# Patient Record
Sex: Female | Born: 1960
Health system: Southern US, Community
[De-identification: ages and names within clinical notes are randomized; demographics above are authoritative.]

## PROBLEM LIST (undated history)

## (undated) DIAGNOSIS — I10 Essential (primary) hypertension: Secondary | ICD-10-CM

## (undated) DIAGNOSIS — E119 Type 2 diabetes mellitus without complications: Secondary | ICD-10-CM

## (undated) HISTORY — DX: Essential (primary) hypertension: I10

## (undated) HISTORY — PX: TUBAL LIGATION: SHX77

---

## 1998-03-18 DIAGNOSIS — I10 Essential (primary) hypertension: Secondary | ICD-10-CM | POA: Insufficient documentation

## 2002-03-18 HISTORY — PX: ABDOMINAL HYSTERECTOMY: SHX81

## 2009-02-13 DIAGNOSIS — Z720 Tobacco use: Secondary | ICD-10-CM | POA: Insufficient documentation

## 2009-02-28 ENCOUNTER — Ambulatory Visit: Payer: Self-pay | Admitting: Family Medicine

## 2009-04-22 DIAGNOSIS — G5600 Carpal tunnel syndrome, unspecified upper limb: Secondary | ICD-10-CM | POA: Insufficient documentation

## 2012-07-20 ENCOUNTER — Ambulatory Visit: Payer: Self-pay | Admitting: Family Medicine

## 2012-07-20 LAB — HM MAMMOGRAPHY

## 2013-05-03 LAB — LIPID PANEL
CHOLESTEROL: 184 mg/dL (ref 0–200)
HDL: 54 mg/dL (ref 35–70)
LDL Cholesterol: 107 mg/dL
Triglycerides: 115 mg/dL (ref 40–160)

## 2013-05-03 LAB — TSH: TSH: 0.51 u[IU]/mL (ref <=5.90)

## 2013-05-12 ENCOUNTER — Ambulatory Visit: Payer: Self-pay | Admitting: Family Medicine

## 2013-11-29 LAB — HM PAP SMEAR

## 2013-12-13 LAB — HEMOGLOBIN A1C: Hgb A1c MFr Bld: 6.3 % — AB (ref 4.0–6.0)

## 2013-12-20 ENCOUNTER — Ambulatory Visit: Payer: Self-pay | Admitting: Family Medicine

## 2014-01-28 LAB — BASIC METABOLIC PANEL
BUN: 11 mg/dL (ref 4–21)
Creatinine: 0.9 mg/dL (ref 0.5–1.1)
Glucose: 149 mg/dL
POTASSIUM: 3.9 mmol/L (ref 3.4–5.3)
Sodium: 137 mmol/L (ref 137–147)

## 2014-01-28 LAB — CBC AND DIFFERENTIAL
HEMATOCRIT: 40 % (ref 36–46)
Hemoglobin: 13.7 g/dL (ref 12.0–16.0)
PLATELETS: 398 10*3/uL (ref 150–399)
WBC: 12.4 10^3/mL

## 2014-01-28 LAB — HEPATIC FUNCTION PANEL
ALT: 17 U/L (ref 7–35)
AST: 9 U/L — AB (ref 13–35)

## 2014-05-02 ENCOUNTER — Ambulatory Visit: Admit: 2014-05-02 | Payer: Self-pay | Admitting: Gastroenterology

## 2014-05-02 ENCOUNTER — Ambulatory Visit: Payer: Self-pay | Admitting: Gastroenterology

## 2014-05-02 LAB — HM COLONOSCOPY

## 2014-10-10 ENCOUNTER — Other Ambulatory Visit: Payer: Self-pay | Admitting: Family Medicine

## 2014-10-10 DIAGNOSIS — I10 Essential (primary) hypertension: Secondary | ICD-10-CM

## 2014-10-10 NOTE — Telephone Encounter (Signed)
Last OV 02/04/14

## 2014-12-19 ENCOUNTER — Other Ambulatory Visit: Payer: Self-pay | Admitting: Family Medicine

## 2014-12-19 DIAGNOSIS — I10 Essential (primary) hypertension: Secondary | ICD-10-CM

## 2015-01-11 ENCOUNTER — Ambulatory Visit: Payer: Self-pay | Admitting: Family Medicine

## 2015-01-11 DIAGNOSIS — E559 Vitamin D deficiency, unspecified: Secondary | ICD-10-CM | POA: Insufficient documentation

## 2015-01-11 DIAGNOSIS — E7800 Pure hypercholesterolemia, unspecified: Secondary | ICD-10-CM | POA: Insufficient documentation

## 2015-01-11 DIAGNOSIS — E669 Obesity, unspecified: Secondary | ICD-10-CM | POA: Insufficient documentation

## 2015-01-11 DIAGNOSIS — IMO0002 Reserved for concepts with insufficient information to code with codable children: Secondary | ICD-10-CM | POA: Insufficient documentation

## 2015-01-11 DIAGNOSIS — E119 Type 2 diabetes mellitus without complications: Secondary | ICD-10-CM | POA: Insufficient documentation

## 2015-03-14 ENCOUNTER — Telehealth: Payer: Self-pay | Admitting: Family Medicine

## 2015-03-14 NOTE — Telephone Encounter (Signed)
Patient is calling to see if you will see her son.  He is 21.  He has been losing weight.  States he has lost 100 in the past year.  He was overweight and wanted to lose the weight.  She says he just needs a good check up.  She states she and her husband Mining engineer( Marlon Loeza) are patients of yours.  He is in school in [place] and is home for break.  Her call back number is [phone].  Caroleen Hammanhnaks,  teri

## 2015-03-14 NOTE — Telephone Encounter (Signed)
Ok to put in 30 minute slot on Friday.  Thanks.

## 2015-03-15 NOTE — Telephone Encounter (Signed)
Appointment scheduled for 03/16/2015 per Emily/MW

## 2015-03-21 ENCOUNTER — Other Ambulatory Visit: Payer: Self-pay | Admitting: Family Medicine

## 2015-03-21 DIAGNOSIS — I10 Essential (primary) hypertension: Secondary | ICD-10-CM

## 2015-03-21 NOTE — Telephone Encounter (Signed)
Last OV 01/2014  Thanks,   -Aliayah Tyer  

## 2015-06-13 ENCOUNTER — Other Ambulatory Visit: Payer: Self-pay | Admitting: Family Medicine

## 2015-06-13 DIAGNOSIS — I10 Essential (primary) hypertension: Secondary | ICD-10-CM

## 2015-06-13 NOTE — Telephone Encounter (Signed)
Has appointment scheduled for 08/18/2015. Allene DillonEmily Drozdowski, CMA

## 2015-06-23 ENCOUNTER — Encounter: Payer: Self-pay | Admitting: Family Medicine

## 2015-07-18 ENCOUNTER — Encounter: Payer: Self-pay | Admitting: Podiatry

## 2015-07-18 ENCOUNTER — Ambulatory Visit (INDEPENDENT_AMBULATORY_CARE_PROVIDER_SITE_OTHER): Payer: 59 | Admitting: Podiatry

## 2015-07-18 DIAGNOSIS — L600 Ingrowing nail: Secondary | ICD-10-CM

## 2015-07-18 NOTE — Progress Notes (Signed)
Subjective:    Patient ID: Madison Ritter, female    DOB: 12/13/1960, 55 y.o.   MRN: [id number]  HPI  55 year old female presents the also concerns of ingrown toenail to her right worse than her left big toe. She states that on the inside border of the right big toe she has noticed some previous swelling and a lot of pain to this area. Denies any drainage or pus. She does have an ingrown toenail the left side as well has is not a symptomatic right side. She has been covering with a Band-Aid of the continue be sore. This been ongoing since October of last year. No other complaints.   Review of Systems  All other systems reviewed and are negative.      Objective:   Physical Exam General: AAO x3, NAD  Dermatological: There is incurvation on the medial nail border of the right hallux toenail in the medial nail border left hallux toenail. The right side is much more symptomatic than left side. There is no edema, erythema, drainage or pus to either nail borders. There is tenderness along the lateral nail border bilaterally right side worse than left. No open lesions.  Vascular: Dorsalis Pedis artery and Posterior Tibial artery pedal pulses are 2/4 bilateral with immedate capillary fill time. Pedal hair growth present. No varicosities and no lower extremity edema present bilateral. There is no pain with calf compression, swelling, warmth, erythema.   Neruologic: Grossly intact via light touch bilateral. Vibratory intact via tuning fork bilateral. Protective threshold with Semmes Wienstein monofilament intact to all pedal sites bilateral. Patellar and Achilles deep tendon reflexes 2+ bilateral. No Babinski or clonus noted bilateral.   Musculoskeletal: Mild hallux malleolus is present bilaterally.  No pain, crepitus, or limitation noted with foot and ankle range of motion bilateral. Muscular strength 5/5 in all groups tested bilateral.  Gait: Unassisted, Nonantalgic.      Assessment & Plan:    Symptomatic ingrown toenail right medial nail border greater than left -Treatment options discussed including all alternatives, risks, and complications -Etiology of symptoms were discussed -At this time, the patient is requesting partial nail removal with chemical matricectomy to the symptomatic portion of the nail of the right medial nail border. She wishes to hold off and the left-sided this time.. Risks and complications were discussed with the patient for which they understand and  verbally consent to the procedure. Under sterile conditions a total of 3 mL of a mixture of 2% lidocaine plain and 0.5% Marcaine plain was infiltrated in a hallux block fashion. Once anesthetized, the skin was prepped in sterile fashion. A tourniquet was then applied. Next the right medial aspect of hallux nail border was then sharply excised making sure to remove the entire offending nail border. Once the nails were ensured to be removed area was debrided and the underlying skin was intact. There is no purulence identified in the procedure. Next phenol was then applied under standard conditions and copiously irrigated. Silvadene was applied. A dry sterile dressing was applied. After application of the dressing the tourniquet was removed and there is found to be an immediate capillary refill time to the digit. The patient tolerated the procedure well any complications. Post procedure instructions were discussed the patient for which he verbally understood. Follow-up in one week for nail check or sooner if any problems are to arise. Discussed signs/symptoms of infection and directed to call the office immediately should any occur or go directly to the emergency room. In the  meantime, encouraged to call the office with any questions, concerns, changes symptoms.  Ovid Curd, DPM

## 2015-07-18 NOTE — Patient Instructions (Signed)

## 2015-07-27 ENCOUNTER — Ambulatory Visit: Payer: 59 | Admitting: Podiatry

## 2015-08-01 ENCOUNTER — Encounter: Payer: Self-pay | Admitting: Podiatry

## 2015-08-01 ENCOUNTER — Ambulatory Visit (INDEPENDENT_AMBULATORY_CARE_PROVIDER_SITE_OTHER): Payer: 59 | Admitting: Podiatry

## 2015-08-01 DIAGNOSIS — L600 Ingrowing nail: Secondary | ICD-10-CM

## 2015-08-01 DIAGNOSIS — Z9889 Other specified postprocedural states: Secondary | ICD-10-CM

## 2015-08-01 NOTE — Patient Instructions (Signed)

## 2015-08-01 NOTE — Progress Notes (Signed)
Patient ID: Madison MylarCarmen Ritter, female   DOB: 05/04/1960, 55 y.o.   MRN: [id number]  Subjective: Madison Ritter is a 55 y.o.  female returns to office today for follow up evaluation after having right medial hallux permanenet nail avulsion performed. Patient has been soaking using epsom salts and applying topical antibiotic covered with bandaid daily. No pain or drainage or redness/swelling. Patient denies fevers, chills, nausea, vomiting. Denies any calf pain, chest pain, SOB.   Objective:  Vitals: Reviewed  General: Well developed, nourished, in no acute distress, alert and oriented x3   Dermatology: Skin is warm, dry and supple bilateral. Medial hallux nail border appears to be clean, dry, with mild granular tissue and surrounding scab. There is no surrounding erythema, edema, drainage/purulence. The remaining nails appear unremarkable at this time. There are no other lesions or other signs of infection present.  Neurovascular status: Intact. No lower extremity swelling; No pain with calf compression bilateral.  Musculoskeletal: Decreased tenderness to palpation of the lateral hallux nail fold. Muscular strength within normal limits bilateral.   Assesement and Plan: S/p partial nail avulsion, doing well.   -Continue soaking in epsom salts twice a day followed by antibiotic ointment and a band-aid. Can leave uncovered at night. Continue this until completely healed.  -If the area has not healed in 2 weeks, call the office for follow-up appointment, or sooner if any problems arise.  -Monitor for any signs/symptoms of infection. Call the office immediately if any occur or go directly to the emergency room. Call with any questions/concerns.  Ovid CurdMatthew Lavalle Skoda, DPM

## 2015-08-18 ENCOUNTER — Encounter: Payer: Self-pay | Admitting: Family Medicine

## 2015-08-18 ENCOUNTER — Ambulatory Visit (INDEPENDENT_AMBULATORY_CARE_PROVIDER_SITE_OTHER): Payer: 59 | Admitting: Family Medicine

## 2015-08-18 VITALS — BP 176/106 | HR 88 | Temp 98.2°F | Resp 16 | Ht 64.0 in | Wt 205.0 lb

## 2015-08-18 DIAGNOSIS — R739 Hyperglycemia, unspecified: Secondary | ICD-10-CM

## 2015-08-18 DIAGNOSIS — Z1239 Encounter for other screening for malignant neoplasm of breast: Secondary | ICD-10-CM

## 2015-08-18 DIAGNOSIS — R7309 Other abnormal glucose: Secondary | ICD-10-CM | POA: Diagnosis not present

## 2015-08-18 DIAGNOSIS — E7800 Pure hypercholesterolemia, unspecified: Secondary | ICD-10-CM

## 2015-08-18 DIAGNOSIS — R319 Hematuria, unspecified: Secondary | ICD-10-CM

## 2015-08-18 DIAGNOSIS — Z0000 Encounter for general adult medical examination without abnormal findings: Secondary | ICD-10-CM

## 2015-08-18 DIAGNOSIS — Z1159 Encounter for screening for other viral diseases: Secondary | ICD-10-CM | POA: Diagnosis not present

## 2015-08-18 DIAGNOSIS — E668 Other obesity: Secondary | ICD-10-CM | POA: Diagnosis not present

## 2015-08-18 DIAGNOSIS — Z23 Encounter for immunization: Secondary | ICD-10-CM | POA: Diagnosis not present

## 2015-08-18 DIAGNOSIS — Z720 Tobacco use: Secondary | ICD-10-CM | POA: Diagnosis not present

## 2015-08-18 DIAGNOSIS — IMO0002 Reserved for concepts with insufficient information to code with codable children: Secondary | ICD-10-CM

## 2015-08-18 DIAGNOSIS — E559 Vitamin D deficiency, unspecified: Secondary | ICD-10-CM | POA: Diagnosis not present

## 2015-08-18 DIAGNOSIS — I10 Essential (primary) hypertension: Secondary | ICD-10-CM

## 2015-08-18 LAB — POCT URINALYSIS DIPSTICK
BILIRUBIN UA: NEGATIVE
GLUCOSE UA: 250
KETONES UA: NEGATIVE
Leukocytes, UA: NEGATIVE
Nitrite, UA: NEGATIVE
Protein, UA: NEGATIVE
SPEC GRAV UA: 1.02
UROBILINOGEN UA: 0.2
pH, UA: 5

## 2015-08-18 MED ORDER — LOSARTAN POTASSIUM 50 MG PO TABS: 50.0000 mg | ORAL_TABLET | Freq: Every day | ORAL | Status: DC

## 2015-08-18 NOTE — Progress Notes (Signed)
Patient ID: Madison MylarCarmen Desaulniers, female   DOB: 04/12/1960, 55 y.o.   MRN: [id number]       Patient: Madison Ritter, Female    DOB: 05/02/1960, 55 y.o.   MRN: [id number] Visit Date: 08/18/2015  Today's Provider: Lorie PhenixNancy Elio Haden, MD   Chief Complaint  Patient presents with  . Annual Exam  . Urinary Frequency  . Hypertension   Subjective:    Annual physical exam Madison Ritter is a 55 y.o. female who presents today for health maintenance and complete physical. She feels well. She reports exercising 3 days a week. She reports she is sleeping well. 11/29/13 CPE 11/29/13 Pap-neg 07/20/12 Mammogram-BI-RADS 2 05/02/14 Colonoscopy-2 hyperplastic polyps. Dr. Servando SnareWohl -----------------------------------------------------------------  Frequent urination: patient c/o frequent urination for several months. Patient denies pain or blood with urination.    Hypertension, follow-up:  BP Readings from Last 3 Encounters:  08/18/15 176/106  05/05/14 134/90    She was last seen for hypertension 1 years ago.  BP at that visit was 134/90. Management changes since that visit include no changes. She reports excellent compliance with treatment. She is not having side effects.  She is exercising. She is adherent to low salt diet.   Outside blood pressures are not being checked. She is experiencing fatigue.  Patient denies chest pain.   Cardiovascular risk factors include smoking/ tobacco exposure.  Use of agents associated with hypertension: none.     Weight trend: stable Wt Readings from Last 3 Encounters:  08/18/15 205 lb (92.987 kg)  05/05/14 201 lb (91.173 kg)    Current diet: in general, a "healthy" diet    ------------------------------------------------------------------------     Review of Systems  Constitutional: Positive for fatigue.  HENT: Negative.   Eyes: Positive for discharge and itching.  Respiratory: Negative.   Cardiovascular: Negative.   Gastrointestinal: Negative.     Endocrine: Negative.   Genitourinary: Positive for urgency.  Musculoskeletal: Negative.   Skin: Negative.   Allergic/Immunologic: Negative.   Neurological: Negative.   Hematological: Negative.   Psychiatric/Behavioral: Negative.     Social History      She  reports that she has been smoking Cigars.  She has never used smokeless tobacco. She reports that she drinks about 0.6 oz of alcohol per week. She reports that she does not use illicit drugs.       Social History   Social History  . Marital Status: Married    Spouse Name: N/A  . Number of Children: 1  . Years of Education: N/A   Social History Main Topics  . Smoking status: Current Every Day Smoker    Types: Cigars  . Smokeless tobacco: Never Used     Comment: 1 cigar daily  . Alcohol Use: 0.6 oz/week    1 Glasses of wine per week     Comment: occasional  . Drug Use: No  . Sexual Activity: Not Asked   Other Topics Concern  . None   Social History Narrative    History reviewed. No pertinent past medical history.   Patient Active Problem List   Diagnosis Date Noted  . Hematuria 08/18/2015  . Adult BMI 30+ 01/11/2015  . Elevated blood sugar 01/11/2015  . Hypercholesteremia 01/11/2015  . Avitaminosis D 01/11/2015  . Carpal tunnel syndrome 04/22/2009  . Current tobacco use 02/13/2009  . Essential (primary) hypertension 03/18/1998    Past Surgical History  Procedure Laterality Date  . Abdominal hysterectomy  2004  . Tubal ligation      Family History  Family Status  Relation Status Death Age  . Mother Alive   . Father Deceased 1  . Sister Alive   . Brother Alive   . Brother Alive         Her family history includes Cancer in her father; Congestive Heart Failure in her mother; Diabetes in her mother; Hypertension in her brother, brother, father, and mother; Sleep apnea in her mother; Stroke in her brother and mother; Thyroid disease in her sister.    Allergies  Allergen Reactions  . Sulfa  Antibiotics     Previous Medications   AMLODIPINE (NORVASC) 5 MG TABLET    Take 1 tablet (5 mg total) by mouth daily.   HYDROCHLOROTHIAZIDE (HYDRODIURIL) 12.5 MG TABLET    Take 1 tablet (12.5 mg total) by mouth daily.    Patient Care Team: Lorie Phenix, MD as PCP - General (Family Medicine)     Objective:   Vitals: BP 176/106 mmHg  Pulse 88  Temp(Src) 98.2 F (36.8 C) (Oral)  Resp 16  Ht  (1.626 m)  Wt 205 lb (92.987 kg)  BMI 35.17 kg/m2   Physical Exam  Constitutional: She is oriented to person, place, and time. She appears well-developed and well-nourished.  HENT:  Head: Normocephalic and atraumatic.  Right Ear: Tympanic membrane, external ear and ear canal normal.  Left Ear: Tympanic membrane, external ear and ear canal normal.  Nose: Nose normal.  Mouth/Throat: Uvula is midline, oropharynx is clear and moist and mucous membranes are normal.  Eyes: Conjunctivae, EOM and lids are normal. Pupils are equal, round, and reactive to light.  Neck: Trachea normal and normal range of motion. Neck supple. Carotid bruit is not present. No thyroid mass and no thyromegaly present.  Cardiovascular: Normal rate, regular rhythm and normal heart sounds.   Pulmonary/Chest: Effort normal and breath sounds normal.  Abdominal: Soft. Normal appearance and bowel sounds are normal. There is no hepatosplenomegaly. There is no tenderness.  Genitourinary: No breast swelling, tenderness or discharge.  Musculoskeletal: Normal range of motion.  Lymphadenopathy:    She has no cervical adenopathy.    She has no axillary adenopathy.  Neurological: She is alert and oriented to person, place, and time. She has normal strength. No cranial nerve deficit.  Skin: Skin is warm, dry and intact.  Psychiatric: She has a normal mood and affect. Her speech is normal and behavior is normal. Judgment and thought content normal. Cognition and memory are normal.     Depression Screen PHQ 2/9 Scores 08/18/2015    PHQ - 2 Score 0      Assessment & Plan:     Routine Health Maintenance and Physical Exam  Exercise Activities and Dietary recommendations Goals    None      Immunization History  Administered Date(s) Administered  . Tdap 08/18/2015    1. Annual physical exam Encourage increase activitiy   - POCT urinalysis dipstick Results for orders placed or performed in visit on 08/18/15  POCT urinalysis dipstick  Result Value Ref Range   Color, UA yellow    Clarity, UA clear    Glucose, UA 250    Bilirubin, UA neg    Ketones, UA neg    Spec Grav, UA 1.020    Blood, UA moderate    pH, UA 5.0    Protein, UA neg    Urobilinogen, UA 0.2    Nitrite, UA neg    Leukocytes, UA Negative Negative   2. Breast cancer screening - MM  DIGITAL SCREENING BILATERAL; Future  3. Essential (primary) hypertension Will add Losartan. Recheck in 2 weeks.  - CBC with Differential/Platelet - Comprehensive metabolic panel - losartan (COZAAR) 50 MG tablet; Take 1 tablet (50 mg total) by mouth daily.  Dispense: 90 tablet; Refill: 3  4. Avitaminosis D - VITAMIN D 25 Hydroxy (Vit-D Deficiency, Fractures)  5. Adult BMI 30+ Encouraged lifestyle changes.  6. Current tobacco use Encouraged cessation. Did not address fully. Will address at follow up.   7. Elevated blood sugar Sugar in uine. Concerning for diabetes. Will check sugar.  - Hemoglobin A1c  8. Hypercholesteremia Will check labs.  - Lipid Panel With LDL/HDL Ratio - TSH  9. Need for hepatitis C screening test - Hepatitis C antibody  10. Need for Tdap vaccination Given today.  - Tdap vaccine greater than or equal to 7yo IM  11. Hematuria Will send for UA.  - Urine Microscopic   Patient seen and examined by Dr. Leo Grosser, and note scribed by Liz Beach. Dimas, CMA.  I have reviewed the document for accuracy and completeness and I agree with above. Leo Grosser, MD   Lorie Phenix, MD    --------------------------------------------------------------------

## 2015-08-18 NOTE — Patient Instructions (Signed)
Please call the [facility] at [facility] to schedule this at [phone]   

## 2015-08-23 LAB — LIPID PANEL WITH LDL/HDL RATIO
Cholesterol, Total: 181 mg/dL (ref 100–199)
HDL: 49 mg/dL (ref >39)
LDL Calculated: 110 mg/dL — ABNORMAL HIGH (ref 0–99)
LDL/HDL RATIO: 2.2 ratio (ref 0.0–3.2)
TRIGLYCERIDES: 111 mg/dL (ref 0–149)
VLDL Cholesterol Cal: 22 mg/dL (ref 5–40)

## 2015-08-23 LAB — COMPREHENSIVE METABOLIC PANEL
A/G RATIO: 1.4 (ref 1.2–2.2)
ALBUMIN: 4 g/dL (ref 3.5–5.5)
ALK PHOS: 109 IU/L (ref 39–117)
ALT: 26 IU/L (ref 0–32)
AST: 14 IU/L (ref 0–40)
BILIRUBIN TOTAL: 0.3 mg/dL (ref 0.0–1.2)
BUN / CREAT RATIO: 13 (ref 9–23)
BUN: 10 mg/dL (ref 6–24)
CHLORIDE: 101 mmol/L (ref 96–106)
CO2: 25 mmol/L (ref 18–29)
Calcium: 9.3 mg/dL (ref 8.7–10.2)
Creatinine, Ser: 0.78 mg/dL (ref 0.57–1.00)
GFR calc Af Amer: 100 mL/min/{1.73_m2} (ref >59)
GFR calc non Af Amer: 87 mL/min/{1.73_m2} (ref >59)
GLUCOSE: 124 mg/dL — AB (ref 65–99)
Globulin, Total: 2.9 g/dL (ref 1.5–4.5)
POTASSIUM: 3.8 mmol/L (ref 3.5–5.2)
Sodium: 139 mmol/L (ref 134–144)
Total Protein: 6.9 g/dL (ref 6.0–8.5)

## 2015-08-23 LAB — CBC WITH DIFFERENTIAL/PLATELET
BASOS ABS: 0.1 10*3/uL (ref 0.0–0.2)
Basos: 1 %
EOS (ABSOLUTE): 0.2 10*3/uL (ref 0.0–0.4)
Eos: 3 %
HEMOGLOBIN: 14 g/dL (ref 11.1–15.9)
Hematocrit: 41.1 % (ref 34.0–46.6)
Immature Grans (Abs): 0 10*3/uL (ref 0.0–0.1)
Immature Granulocytes: 0 %
LYMPHS ABS: 2.7 10*3/uL (ref 0.7–3.1)
Lymphs: 33 %
MCH: 30.6 pg (ref 26.6–33.0)
MCHC: 34.1 g/dL (ref 31.5–35.7)
MCV: 90 fL (ref 79–97)
Monocytes Absolute: 0.8 10*3/uL (ref 0.1–0.9)
Monocytes: 9 %
NEUTROS ABS: 4.5 10*3/uL (ref 1.4–7.0)
Neutrophils: 54 %
PLATELETS: 390 10*3/uL — AB (ref 150–379)
RBC: 4.58 x10E6/uL (ref 3.77–5.28)
RDW: 13.6 % (ref 12.3–15.4)
WBC: 8.2 10*3/uL (ref 3.4–10.8)

## 2015-08-23 LAB — VITAMIN D 25 HYDROXY (VIT D DEFICIENCY, FRACTURES): VIT D 25 HYDROXY: 10.5 ng/mL — AB (ref 30.0–100.0)

## 2015-08-23 LAB — HEMOGLOBIN A1C
Est. average glucose Bld gHb Est-mCnc: 154 mg/dL
HEMOGLOBIN A1C: 7 % — AB (ref 4.8–5.6)

## 2015-08-23 LAB — HEPATITIS C ANTIBODY

## 2015-08-23 LAB — TSH: TSH: 0.429 u[IU]/mL — AB (ref 0.450–4.500)

## 2015-08-31 ENCOUNTER — Ambulatory Visit (INDEPENDENT_AMBULATORY_CARE_PROVIDER_SITE_OTHER): Payer: 59 | Admitting: Family Medicine

## 2015-08-31 ENCOUNTER — Encounter: Payer: Self-pay | Admitting: Family Medicine

## 2015-08-31 VITALS — BP 150/100 | HR 80 | Temp 98.3°F | Resp 16 | Wt 199.0 lb

## 2015-08-31 DIAGNOSIS — E7800 Pure hypercholesterolemia, unspecified: Secondary | ICD-10-CM | POA: Diagnosis not present

## 2015-08-31 DIAGNOSIS — Z23 Encounter for immunization: Secondary | ICD-10-CM | POA: Diagnosis not present

## 2015-08-31 DIAGNOSIS — IMO0002 Reserved for concepts with insufficient information to code with codable children: Secondary | ICD-10-CM

## 2015-08-31 DIAGNOSIS — E668 Other obesity: Secondary | ICD-10-CM

## 2015-08-31 DIAGNOSIS — E559 Vitamin D deficiency, unspecified: Secondary | ICD-10-CM

## 2015-08-31 DIAGNOSIS — I10 Essential (primary) hypertension: Secondary | ICD-10-CM | POA: Diagnosis not present

## 2015-08-31 DIAGNOSIS — Z720 Tobacco use: Secondary | ICD-10-CM | POA: Diagnosis not present

## 2015-08-31 DIAGNOSIS — E119 Type 2 diabetes mellitus without complications: Secondary | ICD-10-CM

## 2015-08-31 MED ORDER — LOSARTAN POTASSIUM 100 MG PO TABS: 50.0000 mg | ORAL_TABLET | Freq: Every day | ORAL | Status: DC

## 2015-08-31 MED ORDER — VITAMIN D3 50 MCG (2000 UT) PO TABS: 1.0000 | ORAL_TABLET | Freq: Every day | ORAL | Status: DC

## 2015-08-31 NOTE — Progress Notes (Signed)
Subjective:    Patient ID: Madison Ritter, female    Madison BickersB: 12/05/1960, 55 y.o.   MRN: [id number]  HPI   Follow up for Hypertension  The patient was last seen for this 2 weeks ago. BP was 176/106. Changes made at last visit include adding Losartan.  She reports good compliance with treatment. Has not taken medication today. She is not having side effects.  ------------------------------------------------------------------------------------    Diabetes Mellitus Type II, Follow-up:   Lab Results  Component Value Date   HGBA1C 7.0* 08/22/2015   HGBA1C 6.3* 12/13/2013   This is a new problem. Last A1C was 08/22/2015 and was 7.0%. Current symptoms include visual disturbances and weight loss and have been unchanged.  Episodes of hypoglycemia? no   Current diet: in general, a "healthy" diet   Current exercise: none  Pertinent Labs:    Component Value Date/Time   CHOL 181 08/22/2015 0947   CHOL 184 05/03/2013   TRIG 111 08/22/2015 0947   HDL 49 08/22/2015 0947   HDL 54 05/03/2013   LDLCALC 110* 08/22/2015 0947   LDLCALC 107 05/03/2013   CREATININE 0.78 08/22/2015 0947   CREATININE 0.9 01/28/2014    Wt Readings from Last 3 Encounters:  08/31/15 199 lb (90.266 kg)  08/18/15 205 lb (92.987 kg)  05/05/14 201 lb (91.173 kg)    ------------------------------------------------------------------------   Vitamin D Deficiency Vitamin D was checked on 08/22/2015 and was 10.5. Pt is not taking supplements for this.  Review of Systems  Constitutional: Positive for fatigue. Negative for fever, chills, diaphoresis, activity change and appetite change.  Eyes: Positive for visual disturbance.  Respiratory: Negative for cough, shortness of breath and wheezing.   Cardiovascular: Negative for chest pain, palpitations and leg swelling.  Endocrine: Negative for polydipsia and polyuria.  Neurological: Negative for numbness and headaches.   BP 150/100 mmHg  Pulse 80  Temp(Src)  98.3 F (36.8 C) (Oral)  Resp 16  Wt 199 lb (90.266 kg)   Patient Active Problem List   Diagnosis Date Noted  . Hematuria 08/18/2015  . Adult BMI 30+ 01/11/2015  . Elevated blood sugar 01/11/2015  . Hypercholesteremia 01/11/2015  . Avitaminosis D 01/11/2015  . Carpal tunnel syndrome 04/22/2009  . Current tobacco use 02/13/2009  . Essential (primary) hypertension 03/18/1998   No past medical history on file. Current Outpatient Prescriptions on File Prior to Visit  Medication Sig  . amLODipine (NORVASC) 5 MG tablet Take 1 tablet (5 mg total) by mouth daily.  . hydrochlorothiazide (HYDRODIURIL) 12.5 MG tablet Take 1 tablet (12.5 mg total) by mouth daily.  Marland Kitchen. losartan (COZAAR) 50 MG tablet Take 1 tablet (50 mg total) by mouth daily.   No current facility-administered medications on file prior to visit.   Allergies  Allergen Reactions  . Sulfa Antibiotics    Past Surgical History  Procedure Laterality Date  . Abdominal hysterectomy  2004  . Tubal ligation     Social History   Social History  . Marital Status: Married    Spouse Name: N/A  . Number of Children: 1  . Years of Education: N/A   Occupational History  . Not on file.   Social History Main Topics  . Smoking status: Current Every Day Smoker    Types: Cigars  . Smokeless tobacco: Never Used     Comment: 1 cigar daily  . Alcohol Use: Yes     Comment: occasional  . Drug Use: No  . Sexual Activity: Not on file  Other Topics Concern  . Not on file   Social History Narrative   Family History  Problem Relation Age of Onset  . Diabetes Mother   . Hypertension Mother   . Congestive Heart Failure Mother   . Sleep apnea Mother   . Stroke Mother   . Cancer Father   . Hypertension Father   . Thyroid disease Sister   . Hypertension Brother   . Stroke Brother   . Hypertension Brother        Objective:   Physical Exam  Constitutional: She appears well-developed and well-nourished.  Pulmonary/Chest:  Effort normal.  Psychiatric: She has a normal mood and affect. Her behavior is normal.  BP 150/100 mmHg  Pulse 80  Temp(Src) 98.3 F (36.8 C) (Oral)  Resp 16  Wt 199 lb (90.266 kg)     Assessment & Plan:  1. Essential (primary) hypertension Not to goal. Increase Losartan to 100 mg. Monitor BP at home. Call for problems. FU with Madison Ritter in 3 months. - losartan (COZAAR) 100 MG tablet; Take 0.5 tablets (50 mg total) by mouth daily.  Dispense: 90 tablet; Refill: 3 - Comprehensive metabolic panel  2. Type 2 diabetes mellitus without complication, without long-term current use of insulin (HCC) New onset. A1C was 7.0% 2 weeks ago. Pt refuses medication at this time, would rather work on diet and exercise. Will FU with Madison Ritter in 3 months.  3. Avitaminosis D Start OTC Vitamin D 2000 IU qd. - Cholecalciferol (VITAMIN D3) 2000 units TABS; Take 1 tablet by mouth daily.  Dispense: 30 tablet; Refill: 0  4. Need for pneumococcal vaccination Given today due to new onset diabetes. - Pneumococcal polysaccharide vaccine 23-valent greater than or equal to 2yo subcutaneous/IM  5. Current tobacco use Advised pt to quit smoking, especially now since she is diabetic. Does agree to quit. Will call office if she needs assistance with tobacco cessation.  6. Hypercholesteremia Advised pt she will need to start statin medication if she can not bring sugars down to nondiabetic range.  7. Adult BMI 30+ Pt willing to start lifestyle changes. Does not want to stay diabetic. Is determined to lose weight.   Patient seen and examined by Madison Grosser, MD, and note scribed by Madison Ritter, CMA.   I have reviewed the document for accuracy and completeness and I agree with above. Madison Grosser, MD   Madison Phenix, MD

## 2015-09-01 ENCOUNTER — Telehealth: Payer: Self-pay

## 2015-09-01 LAB — COMPREHENSIVE METABOLIC PANEL
ALBUMIN: 4.2 g/dL (ref 3.5–5.5)
ALT: 23 IU/L (ref 0–32)
AST: 15 IU/L (ref 0–40)
Albumin/Globulin Ratio: 1.4 (ref 1.2–2.2)
Alkaline Phosphatase: 114 IU/L (ref 39–117)
BUN/Creatinine Ratio: 14 (ref 9–23)
BUN: 11 mg/dL (ref 6–24)
Bilirubin Total: 0.4 mg/dL (ref 0.0–1.2)
CALCIUM: 9.9 mg/dL (ref 8.7–10.2)
CO2: 23 mmol/L (ref 18–29)
CREATININE: 0.76 mg/dL (ref 0.57–1.00)
Chloride: 97 mmol/L (ref 96–106)
GFR calc Af Amer: 104 mL/min/{1.73_m2} (ref >59)
GFR calc non Af Amer: 90 mL/min/{1.73_m2} (ref >59)
GLOBULIN, TOTAL: 3 g/dL (ref 1.5–4.5)
Glucose: 136 mg/dL — ABNORMAL HIGH (ref 65–99)
Potassium: 4 mmol/L (ref 3.5–5.2)
Sodium: 138 mmol/L (ref 134–144)
Total Protein: 7.2 g/dL (ref 6.0–8.5)

## 2015-09-01 NOTE — Telephone Encounter (Signed)
-----   Message from Lorie PhenixNancy Maloney, MD sent at 09/01/2015  7:25 AM EDT ----- Labs stable. Please notify patient. Thanks.

## 2015-09-01 NOTE — Telephone Encounter (Signed)
Pt advised.   Thanks,   -Laura  

## 2015-09-13 ENCOUNTER — Other Ambulatory Visit: Payer: Self-pay

## 2015-09-13 DIAGNOSIS — I10 Essential (primary) hypertension: Secondary | ICD-10-CM

## 2015-09-13 MED ORDER — LOSARTAN POTASSIUM 100 MG PO TABS: 100.0000 mg | ORAL_TABLET | Freq: Every day | ORAL | Status: DC

## 2015-09-27 LAB — URINALYSIS, MICROSCOPIC ONLY
BACTERIA UA: NONE SEEN
CASTS: NONE SEEN /LPF
WBC, UA: NONE SEEN /hpf (ref 0–?)

## 2015-12-06 ENCOUNTER — Encounter: Payer: Self-pay | Admitting: Physician Assistant

## 2015-12-06 ENCOUNTER — Ambulatory Visit (INDEPENDENT_AMBULATORY_CARE_PROVIDER_SITE_OTHER): Payer: 59 | Admitting: Physician Assistant

## 2015-12-06 VITALS — BP 150/100 | HR 82 | Temp 98.6°F | Resp 16 | Ht 64.0 in | Wt 205.0 lb

## 2015-12-06 DIAGNOSIS — Z1239 Encounter for other screening for malignant neoplasm of breast: Secondary | ICD-10-CM | POA: Diagnosis not present

## 2015-12-06 DIAGNOSIS — E119 Type 2 diabetes mellitus without complications: Secondary | ICD-10-CM

## 2015-12-06 DIAGNOSIS — I10 Essential (primary) hypertension: Secondary | ICD-10-CM

## 2015-12-06 LAB — POCT GLYCOSYLATED HEMOGLOBIN (HGB A1C)
ESTIMATED AVERAGE GLUCOSE: 146
HEMOGLOBIN A1C: 6.7

## 2015-12-06 MED ORDER — HYDROCHLOROTHIAZIDE 12.5 MG PO TABS: 12.5000 mg | ORAL_TABLET | Freq: Every day | ORAL | 1 refills | Status: DC

## 2015-12-06 MED ORDER — AMLODIPINE BESYLATE 5 MG PO TABS: 5.0000 mg | ORAL_TABLET | Freq: Every day | ORAL | 1 refills | Status: DC

## 2015-12-06 MED ORDER — LOSARTAN POTASSIUM 100 MG PO TABS: 100.0000 mg | ORAL_TABLET | Freq: Every day | ORAL | 1 refills | Status: DC

## 2015-12-06 NOTE — Progress Notes (Signed)
Patient: Madison Ritter Female    DOB: 10/27/1960   55 y.o.   MRN: [id number] Visit Date: 12/11/2015  Today's Provider: Margaretann LovelessJennifer M Colbe Viviano, PA-C   Chief Complaint  Patient presents with  . Diabetes  . Hypertension   Subjective:    HPI  Diabetes Mellitus Type II, Follow-up:   Lab Results  Component Value Date   HGBA1C 6.7 12/06/2015   HGBA1C 7.0 (H) 08/22/2015   HGBA1C 6.3 (A) 12/13/2013    Last seen for diabetes 3 months ago.  Management since then includes work on diet and exercise. She reports good compliance with treatment. She is not having side effects.  Current symptoms include none and have been stable. Home blood sugar records: not being checked  Episodes of hypoglycemia? no   Current Insulin Regimen:  Most Recent Eye Exam: not up to date Weight trend: increasing steadily Prior visit with dietician: no Current diet: in general, a "healthy" diet   Current exercise: walking  Pertinent Labs:    Component Value Date/Time   CHOL 181 08/22/2015 0947   TRIG 111 08/22/2015 0947   HDL 49 08/22/2015 0947   LDLCALC 110 (H) 08/22/2015 0947   CREATININE 0.76 08/31/2015 1040    Wt Readings from Last 3 Encounters:  12/06/15 205 lb (93 kg)  08/31/15 199 lb (90.3 kg)  08/18/15 205 lb (93 kg)    ------------------------------------------------------------------------   Hypertension, follow-up:  BP Readings from Last 3 Encounters:  12/06/15 (!) 150/100  08/31/15 (!) 150/100  08/18/15 (!) 176/106    She was last seen for hypertension 3 months ago.  BP at that visit was 150/100 . Management changes since that visit include increase Losartan to 100 mg. She reports excellent compliance with treatment. She is not having side effects.  She is exercising. She is adherent to low salt diet.   Outside blood pressures are stable. She is experiencing none.  Patient denies chest pain and lower extremity edema.   Cardiovascular risk factors include diabetes  mellitus, hypertension, obesity (BMI >= 30 kg/m2) and smoking/ tobacco exposure.  Use of agents associated with hypertension: none.     Weight trend: increasing steadily Wt Readings from Last 3 Encounters:  12/06/15 205 lb (93 kg)  08/31/15 199 lb (90.3 kg)  08/18/15 205 lb (93 kg)    Current diet: in general, a "healthy" diet    ------------------------------------------------------------------------      Allergies  Allergen Reactions  . Sulfa Antibiotics      Current Outpatient Prescriptions:  .  amLODipine (NORVASC) 5 MG tablet, Take 1 tablet (5 mg total) by mouth daily., Disp: 90 tablet, Rfl: 1 .  Cholecalciferol (VITAMIN D3) 2000 units TABS, Take 1 tablet by mouth daily., Disp: 30 tablet, Rfl: 0 .  hydrochlorothiazide (HYDRODIURIL) 12.5 MG tablet, Take 1 tablet (12.5 mg total) by mouth daily., Disp: 90 tablet, Rfl: 1 .  losartan (COZAAR) 100 MG tablet, Take 1 tablet (100 mg total) by mouth daily., Disp: 90 tablet, Rfl: 1  Review of Systems  Constitutional: Negative.   Respiratory: Negative.   Cardiovascular: Negative.   Gastrointestinal: Negative.   Endocrine: Negative.   Neurological: Negative.     Social History  Substance Use Topics  . Smoking status: Current Every Day Smoker    Types: Cigars  . Smokeless tobacco: Never Used     Comment: 1 cigar daily  . Alcohol use Yes     Comment: occasional   Objective:   BP (!) 150/100 (  BP Location: Left Arm, Patient Position: Sitting, Cuff Size: Large)   Pulse 82   Temp 98.6 F (37 C) (Oral)   Resp 16   Ht 5\' 4"  (1.626 m)   Wt 205 lb (93 kg)   SpO2 98%   BMI 35.19 kg/m   Physical Exam  Constitutional: She appears well-developed and well-nourished. No distress.  Neck: Normal range of motion. Neck supple. No tracheal deviation present. No thyromegaly present.  Cardiovascular: Normal rate, regular rhythm and normal heart sounds.  Exam reveals no gallop and no friction rub.   No murmur heard. Pulmonary/Chest:  Effort normal and breath sounds normal. No respiratory distress. She has no wheezes. She has no rales.  Musculoskeletal: She exhibits no edema.  Lymphadenopathy:    She has no cervical adenopathy.  Skin: She is not diaphoretic.  Psychiatric: She has a normal mood and affect. Her behavior is normal. Judgment and thought content normal.  Vitals reviewed.  Diabetic Foot Form - Detailed   Diabetic Foot Exam - detailed Diabetic Foot exam was performed with the following findings:  Yes 12/06/2015  1:35 PM  Is there a history of foot ulcer?:  No Can the patient see the bottom of their feet?:  Yes Are the shoes appropriate in style and fit?:  Yes Is there swelling or and abnormal foot shape?:  No Are the toenails long?:  No Are the toenails thick?:  No Do you have pain in calf while walking?:  No Is there a claw toe deformity?:  No Is there elevated skin temparature?:  No Is there limited skin dorsiflexion?:  No Is there foot or ankle muscle weakness?:  No Are the toenails ingrown?:  No Normal Range of Motion:  Yes Pulse Foot Exam completed.:  Yes  Right posterior Tibialias:  Present Left posterior Tibialias:  Present  Right Dorsalis Pedis:  Present Left Dorsalis Pedis:  Present  Sensory Foot Exam Completed.:  Yes Swelling:  No Semmes-Weinstein Monofilament Test R Foot Test Control:  Neg L Foot Test Control:  Neg  R Site 1-Great Toe:  Neg L Site 1-Great Toe:  Neg  R Site 4:  Neg L Site 4:  Neg  R Site 5:  Neg L Site 5:  Neg    Comments:  Normal foot exam        Assessment & Plan:     1. Type 2 diabetes mellitus without complication, without long-term current use of insulin (HCC) Hemoglobin A1c improved to 6.7. Discussed with patient to continued to be strict with diet and continue lifestyle modifications. She is to continue to increase physical activity as well. I will see her back in 3 months to recheck her A1c. If her A1c does increase again we did discuss starting metformin. -  POCT glycosylated hemoglobin (Hb A1C)  2. Essential (primary) hypertension Stable. Diagnosis pulled for medication refill. Continue current medical treatment plan. - hydrochlorothiazide (HYDRODIURIL) 12.5 MG tablet; Take 1 tablet (12.5 mg total) by mouth daily.  Dispense: 90 tablet; Refill: 1 - losartan (COZAAR) 100 MG tablet; Take 1 tablet (100 mg total) by mouth daily.  Dispense: 90 tablet; Refill: 1 - amLODipine (NORVASC) 5 MG tablet; Take 1 tablet (5 mg total) by mouth daily.  Dispense: 90 tablet; Refill: 1  3. Breast cancer screening Patient states that she missed her mammogram and would like to have this ordered today. Mammogram was ordered as below. - MM Digital Screening; Future       Margaretann Loveless, PA-C  [facility]

## 2015-12-06 NOTE — Patient Instructions (Signed)
DASH Eating Plan  DASH stands for "Dietary Approaches to Stop Hypertension." The DASH eating plan is a healthy eating plan that has been shown to reduce high blood pressure (hypertension). Additional health benefits may include reducing the risk of type 2 diabetes mellitus, heart disease, and stroke. The DASH eating plan may also help with weight loss.  WHAT DO I NEED TO KNOW ABOUT THE DASH EATING PLAN?  For the DASH eating plan, you will follow these general guidelines:  · Choose foods with a percent daily value for sodium of less than 5% (as listed on the food label).  · Use salt-free seasonings or herbs instead of table salt or sea salt.  · Check with your health care provider or pharmacist before using salt substitutes.  · Eat lower-sodium products, often labeled as "lower sodium" or "no salt added."  · Eat fresh foods.  · Eat more vegetables, fruits, and low-fat dairy products.  · Choose whole grains. Look for the word "whole" as the first word in the ingredient list.  · Choose fish and skinless chicken or turkey more often than red meat. Limit fish, poultry, and meat to 6 oz (170 g) each day.  · Limit sweets, desserts, sugars, and sugary drinks.  · Choose heart-healthy fats.  · Limit cheese to 1 oz (28 g) per day.  · Eat more home-cooked food and less restaurant, buffet, and fast food.  · Limit fried foods.  · Cook foods using methods other than frying.  · Limit canned vegetables. If you do use them, rinse them well to decrease the sodium.  · When eating at a restaurant, ask that your food be prepared with less salt, or no salt if possible.  WHAT FOODS CAN I EAT?  Seek help from a dietitian for individual calorie needs.  Grains  Whole grain or whole wheat bread. Brown rice. Whole grain or whole wheat pasta. Quinoa, bulgur, and whole grain cereals. Low-sodium cereals. Corn or whole wheat flour tortillas. Whole grain cornbread. Whole grain crackers. Low-sodium crackers.  Vegetables  Fresh or frozen vegetables  (raw, steamed, roasted, or grilled). Low-sodium or reduced-sodium tomato and vegetable juices. Low-sodium or reduced-sodium tomato sauce and paste. Low-sodium or reduced-sodium canned vegetables.   Fruits  All fresh, canned (in natural juice), or frozen fruits.  Meat and Other Protein Products  Ground beef (85% or leaner), grass-fed beef, or beef trimmed of fat. Skinless chicken or turkey. Ground chicken or turkey. Pork trimmed of fat. All fish and seafood. Eggs. Dried beans, peas, or lentils. Unsalted nuts and seeds. Unsalted canned beans.  Dairy  Low-fat dairy products, such as skim or 1% milk, 2% or reduced-fat cheeses, low-fat ricotta or cottage cheese, or plain low-fat yogurt. Low-sodium or reduced-sodium cheeses.  Fats and Oils  Tub margarines without trans fats. Light or reduced-fat mayonnaise and salad dressings (reduced sodium). Avocado. Safflower, olive, or canola oils. Natural peanut or almond butter.  Other  Unsalted popcorn and pretzels.  The items listed above may not be a complete list of recommended foods or beverages. Contact your dietitian for more options.  WHAT FOODS ARE NOT RECOMMENDED?  Grains  White bread. White pasta. White rice. Refined cornbread. Bagels and croissants. Crackers that contain trans fat.  Vegetables  Creamed or fried vegetables. Vegetables in a cheese sauce. Regular canned vegetables. Regular canned tomato sauce and paste. Regular tomato and vegetable juices.  Fruits  Dried fruits. Canned fruit in light or heavy syrup. Fruit juice.  Meat and Other Protein   Products  Fatty cuts of meat. Ribs, chicken wings, bacon, sausage, bologna, salami, chitterlings, fatback, hot dogs, bratwurst, and packaged luncheon meats. Salted nuts and seeds. Canned beans with salt.  Dairy  Whole or 2% milk, cream, half-and-half, and cream cheese. Whole-fat or sweetened yogurt. Full-fat cheeses or blue cheese. Nondairy creamers and whipped toppings. Processed cheese, cheese spreads, or cheese  curds.  Condiments  Onion and garlic salt, seasoned salt, table salt, and sea salt. Canned and packaged gravies. Worcestershire sauce. Tartar sauce. Barbecue sauce. Teriyaki sauce. Soy sauce, including reduced sodium. Steak sauce. Fish sauce. Oyster sauce. Cocktail sauce. Horseradish. Ketchup and mustard. Meat flavorings and tenderizers. Bouillon cubes. Hot sauce. Tabasco sauce. Marinades. Taco seasonings. Relishes.  Fats and Oils  Butter, stick margarine, lard, shortening, ghee, and bacon fat. Coconut, palm kernel, or palm oils. Regular salad dressings.  Other  Pickles and olives. Salted popcorn and pretzels.  The items listed above may not be a complete list of foods and beverages to avoid. Contact your dietitian for more information.  WHERE CAN I FIND MORE INFORMATION?  National Heart, Lung, and Blood Institute: www.nhlbi.nih.gov/health/health-topics/topics/dash/     This information is not intended to replace advice given to you by your health care provider. Make sure you discuss any questions you have with your health care provider.     Document Released: 02/21/2011 Document Revised: 03/25/2014 Document Reviewed: 01/06/2013  Elsevier Interactive Patient Education ©2016 [employer]

## 2016-03-07 ENCOUNTER — Ambulatory Visit
Admission: RE | Admit: 2016-03-07 | Discharge: 2016-03-07 | Disposition: A | Discharge status: Home / Self Care | Payer: 59 | Source: Ambulatory Visit | Attending: Physician Assistant | Admitting: Physician Assistant

## 2016-03-07 ENCOUNTER — Ambulatory Visit: Payer: 59 | Admitting: Physician Assistant

## 2016-03-07 DIAGNOSIS — Z1231 Encounter for screening mammogram for malignant neoplasm of breast: Secondary | ICD-10-CM | POA: Insufficient documentation

## 2016-03-07 DIAGNOSIS — Z1239 Encounter for other screening for malignant neoplasm of breast: Secondary | ICD-10-CM

## 2016-07-24 ENCOUNTER — Other Ambulatory Visit: Payer: Self-pay | Admitting: Physician Assistant

## 2016-07-24 DIAGNOSIS — I10 Essential (primary) hypertension: Secondary | ICD-10-CM

## 2016-10-31 ENCOUNTER — Other Ambulatory Visit: Payer: Self-pay | Admitting: Physician Assistant

## 2016-10-31 DIAGNOSIS — I10 Essential (primary) hypertension: Secondary | ICD-10-CM

## 2016-11-25 ENCOUNTER — Ambulatory Visit (INDEPENDENT_AMBULATORY_CARE_PROVIDER_SITE_OTHER): Payer: 59 | Admitting: Physician Assistant

## 2016-11-25 ENCOUNTER — Encounter: Payer: Self-pay | Admitting: Physician Assistant

## 2016-11-25 VITALS — BP 150/100 | HR 91 | Temp 98.4°F | Resp 16 | Ht 64.0 in | Wt 198.0 lb

## 2016-11-25 DIAGNOSIS — E119 Type 2 diabetes mellitus without complications: Secondary | ICD-10-CM | POA: Diagnosis not present

## 2016-11-25 DIAGNOSIS — Z1231 Encounter for screening mammogram for malignant neoplasm of breast: Secondary | ICD-10-CM

## 2016-11-25 DIAGNOSIS — Z1272 Encounter for screening for malignant neoplasm of vagina: Secondary | ICD-10-CM

## 2016-11-25 DIAGNOSIS — Z0000 Encounter for general adult medical examination without abnormal findings: Secondary | ICD-10-CM

## 2016-11-25 DIAGNOSIS — E559 Vitamin D deficiency, unspecified: Secondary | ICD-10-CM

## 2016-11-25 DIAGNOSIS — I10 Essential (primary) hypertension: Secondary | ICD-10-CM

## 2016-11-25 DIAGNOSIS — Z1239 Encounter for other screening for malignant neoplasm of breast: Secondary | ICD-10-CM

## 2016-11-25 DIAGNOSIS — Z23 Encounter for immunization: Secondary | ICD-10-CM

## 2016-11-25 DIAGNOSIS — E0590 Thyrotoxicosis, unspecified without thyrotoxic crisis or storm: Secondary | ICD-10-CM | POA: Diagnosis not present

## 2016-11-25 DIAGNOSIS — Z6833 Body mass index (BMI) 33.0-33.9, adult: Secondary | ICD-10-CM

## 2016-11-25 DIAGNOSIS — E7800 Pure hypercholesterolemia, unspecified: Secondary | ICD-10-CM

## 2016-11-25 NOTE — Patient Instructions (Signed)
Health Maintenance for Postmenopausal Women Menopause is a normal process in which your reproductive ability comes to an end. This process happens gradually over a span of months to years, usually between the ages of 22 and 9. Menopause is complete when you have missed 12 consecutive menstrual periods. It is important to talk with your health care provider about some of the most common conditions that affect postmenopausal women, such as heart disease, cancer, and bone loss (osteoporosis). Adopting a healthy lifestyle and getting preventive care can help to promote your health and wellness. Those actions can also lower your chances of developing some of these common conditions. What should I know about menopause? During menopause, you may experience a number of symptoms, such as:  Moderate-to-severe hot flashes.  Night sweats.  Decrease in sex drive.  Mood swings.  Headaches.  Tiredness.  Irritability.  Memory problems.  Insomnia.  Choosing to treat or not to treat menopausal changes is an individual decision that you make with your health care provider. What should I know about hormone replacement therapy and supplements? Hormone therapy products are effective for treating symptoms that are associated with menopause, such as hot flashes and night sweats. Hormone replacement carries certain risks, especially as you become older. If you are thinking about using estrogen or estrogen with progestin treatments, discuss the benefits and risks with your health care provider. What should I know about heart disease and stroke? Heart disease, heart attack, and stroke become more likely as you age. This may be due, in part, to the hormonal changes that your body experiences during menopause. These can affect how your body processes dietary fats, triglycerides, and cholesterol. Heart attack and stroke are both medical emergencies. There are many things that you can do to help prevent heart disease  and stroke:  Have your blood pressure checked at least every 1-2 years. High blood pressure causes heart disease and increases the risk of stroke.  If you are 53-22 years old, ask your health care provider if you should take aspirin to prevent a heart attack or a stroke.  Do not use any tobacco products, including cigarettes, chewing tobacco, or electronic cigarettes. If you need help quitting, ask your health care provider.  It is important to eat a healthy diet and maintain a healthy weight. ? Be sure to include plenty of vegetables, fruits, low-fat dairy products, and lean protein. ? Avoid eating foods that are high in solid fats, added sugars, or salt (sodium).  Get regular exercise. This is one of the most important things that you can do for your health. ? Try to exercise for at least 150 minutes each week. The type of exercise that you do should increase your heart rate and make you sweat. This is known as moderate-intensity exercise. ? Try to do strengthening exercises at least twice each week. Do these in addition to the moderate-intensity exercise.  Know your numbers.Ask your health care provider to check your cholesterol and your blood glucose. Continue to have your blood tested as directed by your health care provider.  What should I know about cancer screening? There are several types of cancer. Take the following steps to reduce your risk and to catch any cancer development as early as possible. Breast Cancer  Practice breast self-awareness. ? This means understanding how your breasts normally appear and feel. ? It also means doing regular breast self-exams. Let your health care provider know about any changes, no matter how small.  If you are 40  or older, have a clinician do a breast exam (clinical breast exam or CBE) every year. Depending on your age, family history, and medical history, it may be recommended that you also have a yearly breast X-ray (mammogram).  If you  have a family history of breast cancer, talk with your health care provider about genetic screening.  If you are at high risk for breast cancer, talk with your health care provider about having an MRI and a mammogram every year.  Breast cancer (BRCA) gene test is recommended for women who have family members with BRCA-related cancers. Results of the assessment will determine the need for genetic counseling and BRCA1 and for BRCA2 testing. BRCA-related cancers include these types: ? Breast. This occurs in males or females. ? Ovarian. ? Tubal. This may also be called fallopian tube cancer. ? Cancer of the abdominal or pelvic lining (peritoneal cancer). ? Prostate. ? Pancreatic.  Cervical, Uterine, and Ovarian Cancer Your health care provider may recommend that you be screened regularly for cancer of the pelvic organs. These include your ovaries, uterus, and vagina. This screening involves a pelvic exam, which includes checking for microscopic changes to the surface of your cervix (Pap test).  For women ages 21-65, health care providers may recommend a pelvic exam and a Pap test every three years. For women ages 79-65, they may recommend the Pap test and pelvic exam, combined with testing for human papilloma virus (HPV), every five years. Some types of HPV increase your risk of cervical cancer. Testing for HPV may also be done on women of any age who have unclear Pap test results.  Other health care providers may not recommend any screening for nonpregnant women who are considered low risk for pelvic cancer and have no symptoms. Ask your health care provider if a screening pelvic exam is right for you.  If you have had past treatment for cervical cancer or a condition that could lead to cancer, you need Pap tests and screening for cancer for at least 20 years after your treatment. If Pap tests have been discontinued for you, your risk factors (such as having a new sexual partner) need to be  reassessed to determine if you should start having screenings again. Some women have medical problems that increase the chance of getting cervical cancer. In these cases, your health care provider may recommend that you have screening and Pap tests more often.  If you have a family history of uterine cancer or ovarian cancer, talk with your health care provider about genetic screening.  If you have vaginal bleeding after reaching menopause, tell your health care provider.  There are currently no reliable tests available to screen for ovarian cancer.  Lung Cancer Lung cancer screening is recommended for adults 69-62 years old who are at high risk for lung cancer because of a history of smoking. A yearly low-dose CT scan of the lungs is recommended if you:  Currently smoke.  Have a history of at least 30 pack-years of smoking and you currently smoke or have quit within the past 15 years. A pack-year is smoking an average of one pack of cigarettes per day for one year.  Yearly screening should:  Continue until it has been 15 years since you quit.  Stop if you develop a health problem that would prevent you from having lung cancer treatment.  Colorectal Cancer  This type of cancer can be detected and can often be prevented.  Routine colorectal cancer screening usually begins at  age 42 and continues through age 45.  If you have risk factors for colon cancer, your health care provider may recommend that you be screened at an earlier age.  If you have a family history of colorectal cancer, talk with your health care provider about genetic screening.  Your health care provider may also recommend using home test kits to check for hidden blood in your stool.  A small camera at the end of a tube can be used to examine your colon directly (sigmoidoscopy or colonoscopy). This is done to check for the earliest forms of colorectal cancer.  Direct examination of the colon should be repeated every  5-10 years until age 71. However, if early forms of precancerous polyps or small growths are found or if you have a family history or genetic risk for colorectal cancer, you may need to be screened more often.  Skin Cancer  Check your skin from head to toe regularly.  Monitor any moles. Be sure to tell your health care provider: ? About any new moles or changes in moles, especially if there is a change in a mole's shape or color. ? If you have a mole that is larger than the size of a pencil eraser.  If any of your family members has a history of skin cancer, especially at a young age, talk with your health care provider about genetic screening.  Always use sunscreen. Apply sunscreen liberally and repeatedly throughout the day.  Whenever you are outside, protect yourself by wearing long sleeves, pants, a wide-brimmed hat, and sunglasses.  What should I know about osteoporosis? Osteoporosis is a condition in which bone destruction happens more quickly than new bone creation. After menopause, you may be at an increased risk for osteoporosis. To help prevent osteoporosis or the bone fractures that can happen because of osteoporosis, the following is recommended:  If you are 46-71 years old, get at least 1,000 mg of calcium and at least 600 mg of vitamin D per day.  If you are older than age 55 but younger than age 65, get at least 1,200 mg of calcium and at least 600 mg of vitamin D per day.  If you are older than age 54, get at least 1,200 mg of calcium and at least 800 mg of vitamin D per day.  Smoking and excessive alcohol intake increase the risk of osteoporosis. Eat foods that are rich in calcium and vitamin D, and do weight-bearing exercises several times each week as directed by your health care provider. What should I know about how menopause affects my mental health? Depression may occur at any age, but it is more common as you become older. Common symptoms of depression  include:  Low or sad mood.  Changes in sleep patterns.  Changes in appetite or eating patterns.  Feeling an overall lack of motivation or enjoyment of activities that you previously enjoyed.  Frequent crying spells.  Talk with your health care provider if you think that you are experiencing depression. What should I know about immunizations? It is important that you get and maintain your immunizations. These include:  Tetanus, diphtheria, and pertussis (Tdap) booster vaccine.  Influenza every year before the flu season begins.  Pneumonia vaccine.  Shingles vaccine.  Your health care provider may also recommend other immunizations. This information is not intended to replace advice given to you by your health care provider. Make sure you discuss any questions you have with your health care provider. Document Released: 04/26/2005  Document Revised: 09/22/2015 Document Reviewed: 12/06/2014 Elsevier Interactive Patient Education  2018 [employer]  

## 2016-11-25 NOTE — Progress Notes (Signed)
Patient: Madison Ritter, Female    DOB: Jan 12, 1961, 56 y.o.   MRN: [id number] Visit Date: 11/25/2016  Today's Provider: Margaretann Loveless, PA-C   Chief Complaint  Patient presents with  . Annual Exam   Subjective:    Annual physical exam Madison Ritter is a 56 y.o. female who presents today for health maintenance and complete physical. She feels well. She reports exercising. She reports she is sleeping well. Patient declined Influenza vaccine today will wait to get it at work.  Last CPE:08/18/2015 Pap:11/29/13 Negative Mammogram:03/07/16 Normal Colonoscopy:05/02/14 2 Hyperplastic polyps. Dr.Wohl; repeat in 10 years -----------------------------------------------------------------   Review of Systems  Constitutional: Negative.   HENT: Negative.   Eyes: Positive for discharge.  Respiratory: Negative.   Cardiovascular: Negative.   Gastrointestinal: Negative.   Endocrine: Negative.   Genitourinary: Negative.   Musculoskeletal: Negative.   Skin: Negative.   Allergic/Immunologic: Negative.   Neurological: Negative.   Hematological: Negative.   Psychiatric/Behavioral: Negative.     Social History      She  reports that she has been smoking Cigars.  She has never used smokeless tobacco. She reports that she drinks alcohol. She reports that she does not use drugs.       Social History   Social History  . Marital status: Married    Spouse name: N/A  . Number of children: 1  . Years of education: N/A   Social History Main Topics  . Smoking status: Current Every Day Smoker    Types: Cigars  . Smokeless tobacco: Never Used     Comment: 1 cigar daily  . Alcohol use Yes     Comment: occasional  . Drug use: No  . Sexual activity: Not Asked   Other Topics Concern  . None   Social History Narrative  . None    History reviewed. No pertinent past medical history.   Patient Active Problem List   Diagnosis Date Noted  . Hematuria 08/18/2015  . Adult BMI 30+  01/11/2015  . Diabetes mellitus type 2, uncomplicated (HCC) 01/11/2015  . Hypercholesteremia 01/11/2015  . Avitaminosis D 01/11/2015  . Carpal tunnel syndrome 04/22/2009  . Current tobacco use 02/13/2009  . Essential (primary) hypertension 03/18/1998    Past Surgical History:  Procedure Laterality Date  . ABDOMINAL HYSTERECTOMY  2004  . TUBAL LIGATION      Family History        Family Status  Relation Status  . Mother Alive  . Father Deceased at age 100  . Sister Alive  . Brother Alive  . Brother Alive  . Neg Hx (Not Specified)        Her family history includes Cancer in her father; Congestive Heart Failure in her mother; Diabetes in her mother; Hypertension in her brother, brother, father, and mother; Sleep apnea in her mother; Stroke in her brother and mother; Thyroid disease in her sister.     Allergies  Allergen Reactions  . Sulfa Antibiotics      Current Outpatient Prescriptions:  .  amLODipine (NORVASC) 5 MG tablet, TAKE 1 TABLET BY MOUTH  DAILY, Disp: 90 tablet, Rfl: 3 .  hydrochlorothiazide (HYDRODIURIL) 12.5 MG tablet, TAKE 1 TABLET BY MOUTH  DAILY, Disp: 90 tablet, Rfl: 3 .  losartan (COZAAR) 100 MG tablet, TAKE 1 TABLET BY MOUTH  DAILY, Disp: 90 tablet, Rfl: 1 .  Cholecalciferol (VITAMIN D3) 2000 units TABS, Take 1 tablet by mouth daily. (Patient not taking: Reported on 11/25/2016), Disp:  30 tablet, Rfl: 0   Patient Care Team: Margaretann LovelessBurnette, Emani Taussig M, PA-C as PCP - General (Family Medicine)      Objective:   Vitals: BP (!) 150/100 (BP Location: Left Arm, Patient Position: Sitting, Cuff Size: Large)   Pulse 91   Temp 98.4 F (36.9 C) (Oral)   Resp 16   Ht 5\' 4"  (1.626 m)   Wt 198 lb (89.8 kg)   BMI 33.99 kg/m     Physical Exam  Constitutional: She is oriented to person, place, and time. She appears well-developed and well-nourished. No distress.  HENT:  Head: Normocephalic and atraumatic.  Right Ear: Hearing, tympanic membrane, external ear and ear  canal normal.  Left Ear: Hearing, tympanic membrane, external ear and ear canal normal.  Nose: Nose normal.  Mouth/Throat: Uvula is midline, oropharynx is clear and moist and mucous membranes are normal. No oropharyngeal exudate.  Eyes: Pupils are equal, round, and reactive to light. Conjunctivae and EOM are normal. Right eye exhibits no discharge. Left eye exhibits no discharge. No scleral icterus.  Neck: Normal range of motion. Neck supple. No JVD present. Carotid bruit is not present. No tracheal deviation present. No thyromegaly present.  Cardiovascular: Normal rate, regular rhythm, normal heart sounds and intact distal pulses.  Exam reveals no gallop and no friction rub.   No murmur heard. Pulmonary/Chest: Effort normal and breath sounds normal. No respiratory distress. She has no wheezes. She has no rales. She exhibits no tenderness. Right breast exhibits no inverted nipple, no mass, no nipple discharge, no skin change and no tenderness. Left breast exhibits no inverted nipple, no mass, no nipple discharge, no skin change and no tenderness. Breasts are symmetrical.  Abdominal: Soft. Bowel sounds are normal. She exhibits no distension and no mass. There is no tenderness. There is no rebound and no guarding.  Genitourinary: Vagina normal and uterus normal.  Musculoskeletal: Normal range of motion. She exhibits no edema or tenderness.  Lymphadenopathy:    She has no cervical adenopathy.  Neurological: She is alert and oriented to person, place, and time. She has normal reflexes.  Skin: Skin is warm and dry. No rash noted. She is not diaphoretic.  Psychiatric: She has a normal mood and affect. Her behavior is normal. Judgment and thought content normal.  Vitals reviewed.    Depression Screen PHQ 2/9 Scores 11/25/2016 08/18/2015  PHQ - 2 Score 0 0      Assessment & Plan:     Routine Health Maintenance and Physical Exam  Exercise Activities and Dietary recommendations Goals    None       Immunization History  Administered Date(s) Administered  . Pneumococcal Polysaccharide-23 08/31/2015  . Tdap 08/18/2015    Health Maintenance  Topic Date Due  . OPHTHALMOLOGY EXAM  05/18/1972  . HIV Screening  05/18/1977  . HEMOGLOBIN A1C  03/06/2016  . INFLUENZA VACCINE  10/16/2016  . PAP SMEAR  11/29/2016  . FOOT EXAM  12/05/2016  . MAMMOGRAM  03/07/2018  . PNEUMOCOCCAL POLYSACCHARIDE VACCINE (2) 08/30/2020  . COLONOSCOPY  05/02/2024  . TETANUS/TDAP  08/17/2025  . Hepatitis C Screening  Completed     Discussed health benefits of physical activity, and encouraged her to engage in regular exercise appropriate for her age and condition.    1. Annual physical exam Normal physical exam today. Will check labs as below and f/u pending lab results. If labs are stable and WNL she will not need to have these rechecked for one year at her  next annual physical exam. She is to call the office in the meantime if she has any acute issue, questions or concerns. - CBC w/Diff/Platelet - Comprehensive Metabolic Panel (CMET) - TSH - Lipid Profile - HgB A1c  2. Breast cancer screening Breast exam today was normal. There is no family history of breast cancer. She does perform regular self breast exams. Mammogram was ordered as below. Information for [facility] clinic was given to patient so she may schedule her mammogram at her convenience. - MM DIGITAL SCREENING BILATERAL; Future  3. Screening for vaginal cancer Pap collected today. Will send as below and f/u pending results. - Pap IG and HPV (high risk) DNA detection  4. Essential (primary) hypertension Slightly elevated today. Advised patient to check BP at home and call us with readings. Continue current medical treatment plan with losartan , amlodipine  and HCTZ 12.5mg . Will check labs as below and f/u pending results. - CBC w/Diff/Platelet - Comprehensive Metabolic Panel (CMET) - Lipid Profile - HgB A1c  5. Type 2  diabetes mellitus without complication, without long-term current use of insulin (HCC) Stable. Diet controlled. Continue current medical treatment plan. Will check labs as below and f/u pending results. - POCT UA - Microalbumin - CBC w/Diff/Platelet - Comprehensive Metabolic Panel (CMET) - Lipid Profile - HgB A1c  6. Hypercholesteremia Will check labs as below and f/u pending results. Patient has declined statins in the past.  - CBC w/Diff/Platelet - Comprehensive Metabolic Panel (CMET) - Lipid Profile - HgB A1c  7. Avitaminosis D H/O this without treatment at this time. Will check labs as below and f/u pending results. - Vitamin D (25 hydroxy)  8. BMI 33.0-33.9,adult Counseled patient on healthy lifestyle modifications including dieting and exercise.  - Comprehensive Metabolic Panel (CMET) - Lipid Profile - HgB A1c  9. Need for influenza vaccination Patient reports she is going to get through her employer.  --------------------------------------------------------------------    Margaretann Loveless, PA-C  [facility]

## 2016-11-26 LAB — POCT UA - MICROALBUMIN: Microalbumin Ur, POC: 20 mg/L

## 2016-11-27 ENCOUNTER — Telehealth: Payer: Self-pay

## 2016-11-27 LAB — PAP IG AND HPV HIGH-RISK
HPV, high-risk: NEGATIVE
PAP Smear Comment: 0

## 2016-11-27 NOTE — Telephone Encounter (Signed)
-----   Message from Margaretann LovelessJennifer M Burnette, PA-C sent at 11/27/2016  9:31 AM EDT ----- Pap is normal, HPV negative.  Will repeat in 3-5 years.

## 2016-11-27 NOTE — Telephone Encounter (Signed)
Patient advised.KW 

## 2016-12-04 ENCOUNTER — Telehealth: Payer: Self-pay

## 2016-12-04 LAB — COMPREHENSIVE METABOLIC PANEL
ALT: 29 IU/L (ref 0–32)
AST: 17 IU/L (ref 0–40)
Albumin/Globulin Ratio: 1.5 (ref 1.2–2.2)
Albumin: 4.2 g/dL (ref 3.5–5.5)
Alkaline Phosphatase: 137 IU/L — ABNORMAL HIGH (ref 39–117)
BUN/Creatinine Ratio: 16 (ref 9–23)
BUN: 13 mg/dL (ref 6–24)
Bilirubin Total: 0.4 mg/dL (ref 0.0–1.2)
CALCIUM: 9.9 mg/dL (ref 8.7–10.2)
CHLORIDE: 101 mmol/L (ref 96–106)
CO2: 26 mmol/L (ref 20–29)
CREATININE: 0.81 mg/dL (ref 0.57–1.00)
GFR calc Af Amer: 95 mL/min/{1.73_m2} (ref >59)
GFR, EST NON AFRICAN AMERICAN: 83 mL/min/{1.73_m2} (ref >59)
GLOBULIN, TOTAL: 2.8 g/dL (ref 1.5–4.5)
Glucose: 131 mg/dL — ABNORMAL HIGH (ref 65–99)
POTASSIUM: 3.6 mmol/L (ref 3.5–5.2)
SODIUM: 142 mmol/L (ref 134–144)
Total Protein: 7 g/dL (ref 6.0–8.5)

## 2016-12-04 LAB — VITAMIN D 25 HYDROXY (VIT D DEFICIENCY, FRACTURES): Vit D, 25-Hydroxy: 11.9 ng/mL — ABNORMAL LOW (ref 30.0–100.0)

## 2016-12-04 LAB — CBC WITH DIFFERENTIAL/PLATELET
Basophils Absolute: 0.1 10*3/uL (ref 0.0–0.2)
Basos: 1 %
EOS (ABSOLUTE): 0.2 10*3/uL (ref 0.0–0.4)
EOS: 2 %
HEMATOCRIT: 42 % (ref 34.0–46.6)
HEMOGLOBIN: 14.5 g/dL (ref 11.1–15.9)
IMMATURE GRANULOCYTES: 0 %
Immature Grans (Abs): 0 10*3/uL (ref 0.0–0.1)
LYMPHS: 37 %
Lymphocytes Absolute: 3.9 10*3/uL — ABNORMAL HIGH (ref 0.7–3.1)
MCH: 31.3 pg (ref 26.6–33.0)
MCHC: 34.5 g/dL (ref 31.5–35.7)
MCV: 91 fL (ref 79–97)
MONOCYTES: 6 %
Monocytes Absolute: 0.6 10*3/uL (ref 0.1–0.9)
NEUTROS PCT: 54 %
Neutrophils Absolute: 5.8 10*3/uL (ref 1.4–7.0)
Platelets: 326 10*3/uL (ref 150–379)
RBC: 4.63 x10E6/uL (ref 3.77–5.28)
RDW: 13.1 % (ref 12.3–15.4)
WBC: 10.6 10*3/uL (ref 3.4–10.8)

## 2016-12-04 LAB — LIPID PANEL
CHOL/HDL RATIO: 3.8 ratio (ref 0.0–4.4)
Cholesterol, Total: 201 mg/dL — ABNORMAL HIGH (ref 100–199)
HDL: 53 mg/dL (ref >39)
LDL CALC: 115 mg/dL — AB (ref 0–99)
TRIGLYCERIDES: 163 mg/dL — AB (ref 0–149)
VLDL Cholesterol Cal: 33 mg/dL (ref 5–40)

## 2016-12-04 LAB — HEMOGLOBIN A1C
ESTIMATED AVERAGE GLUCOSE: 157 mg/dL
Hgb A1c MFr Bld: 7.1 % — ABNORMAL HIGH (ref 4.8–5.6)

## 2016-12-04 LAB — TSH: TSH: 0.37 u[IU]/mL — ABNORMAL LOW (ref 0.450–4.500)

## 2016-12-04 MED ORDER — VITAMIN D (ERGOCALCIFEROL) 1.25 MG (50000 UNIT) PO CAPS: 50000.0000 [IU] | ORAL_CAPSULE | ORAL | 3 refills | Status: DC

## 2016-12-04 MED ORDER — METFORMIN HCL 500 MG PO TABS: 500.0000 mg | ORAL_TABLET | Freq: Two times a day (BID) | ORAL | 3 refills | Status: DC

## 2016-12-04 NOTE — Addendum Note (Signed)
Addended by: Margaretann Loveless on: 12/04/2016 09:37 AM   Modules accepted: Orders

## 2016-12-04 NOTE — Telephone Encounter (Signed)
Patient advised as below.  

## 2016-12-04 NOTE — Telephone Encounter (Signed)
-----   Message from Margaretann Loveless, PA-C sent at 12/04/2016  9:34 AM EDT ----- Cholesterol slightly up from last year. Continue to work on healthy lifestyle modifications. Vit D also low at 11.9. Recommend starting high dose Vit D 50,000IU weekly. This will be sent in. A1c up from 6.7 to 7.1. Again focus on healthy lifestyle modifications and I would recommend starting metformin  twice daily. I will also send this in as well. Also TSH remains low. Would recommend a thyroid US and possible referral to endocrinology to work up hyperthyroidism.

## 2016-12-06 ENCOUNTER — Ambulatory Visit: Payer: 59

## 2016-12-10 ENCOUNTER — Ambulatory Visit
Admission: RE | Admit: 2016-12-10 | Discharge: 2016-12-10 | Disposition: A | Discharge status: Home / Self Care | Payer: 59 | Source: Ambulatory Visit | Attending: Physician Assistant | Admitting: Physician Assistant

## 2016-12-10 DIAGNOSIS — E0590 Thyrotoxicosis, unspecified without thyrotoxic crisis or storm: Secondary | ICD-10-CM

## 2016-12-10 DIAGNOSIS — E041 Nontoxic single thyroid nodule: Secondary | ICD-10-CM | POA: Insufficient documentation

## 2016-12-11 ENCOUNTER — Telehealth: Payer: Self-pay

## 2016-12-11 NOTE — Telephone Encounter (Signed)
-----   Message from Margaretann Loveless, [place] sent at 12/11/2016  1:14 PM EDT ----- 3 nodules noted on the thyroid gland. 2 are very small, one meets criteria at this time for annual surveillance of the nodule with thyroid US for the next 5 years. We will continue to monitor. Also referral to endocrine for further eval has been approved by insurance so you should be hearing from them about scheduling the appt.

## 2016-12-11 NOTE — Telephone Encounter (Signed)
Patient advised as directed below.  Thanks,  -Joseline 

## 2016-12-31 ENCOUNTER — Encounter: Payer: Self-pay | Admitting: Physician Assistant

## 2017-01-03 ENCOUNTER — Encounter: Payer: Self-pay | Admitting: Physician Assistant

## 2017-01-03 DIAGNOSIS — I10 Essential (primary) hypertension: Secondary | ICD-10-CM

## 2017-01-05 MED ORDER — AMLODIPINE BESYLATE 5 MG PO TABS: 10.0000 mg | ORAL_TABLET | Freq: Every day | ORAL | 3 refills | Status: DC

## 2017-04-14 LAB — HM DIABETES EYE EXAM

## 2017-04-16 ENCOUNTER — Encounter: Payer: Self-pay | Admitting: Physician Assistant

## 2017-08-01 DIAGNOSIS — Z7982 Long term (current) use of aspirin: Secondary | ICD-10-CM | POA: Diagnosis not present

## 2017-08-01 DIAGNOSIS — Z79899 Other long term (current) drug therapy: Secondary | ICD-10-CM | POA: Diagnosis not present

## 2017-08-01 DIAGNOSIS — Z86718 Personal history of other venous thrombosis and embolism: Secondary | ICD-10-CM | POA: Diagnosis not present

## 2017-08-01 DIAGNOSIS — G9340 Encephalopathy, unspecified: Secondary | ICD-10-CM | POA: Diagnosis not present

## 2017-08-01 DIAGNOSIS — E1140 Type 2 diabetes mellitus with diabetic neuropathy, unspecified: Secondary | ICD-10-CM | POA: Diagnosis not present

## 2017-08-01 DIAGNOSIS — D631 Anemia in chronic kidney disease: Secondary | ICD-10-CM | POA: Diagnosis not present

## 2017-08-01 DIAGNOSIS — Z992 Dependence on renal dialysis: Secondary | ICD-10-CM | POA: Diagnosis not present

## 2017-08-01 DIAGNOSIS — E669 Obesity, unspecified: Secondary | ICD-10-CM | POA: Diagnosis not present

## 2017-08-01 DIAGNOSIS — Z8673 Personal history of transient ischemic attack (TIA), and cerebral infarction without residual deficits: Secondary | ICD-10-CM | POA: Diagnosis not present

## 2017-08-01 DIAGNOSIS — Z7902 Long term (current) use of antithrombotics/antiplatelets: Secondary | ICD-10-CM | POA: Diagnosis not present

## 2017-08-01 DIAGNOSIS — E7800 Pure hypercholesterolemia, unspecified: Secondary | ICD-10-CM | POA: Diagnosis not present

## 2017-08-01 DIAGNOSIS — I120 Hypertensive chronic kidney disease with stage 5 chronic kidney disease or end stage renal disease: Secondary | ICD-10-CM | POA: Diagnosis not present

## 2017-08-01 DIAGNOSIS — E1122 Type 2 diabetes mellitus with diabetic chronic kidney disease: Secondary | ICD-10-CM | POA: Diagnosis not present

## 2017-08-01 DIAGNOSIS — N186 End stage renal disease: Secondary | ICD-10-CM | POA: Diagnosis not present

## 2017-08-01 DIAGNOSIS — Z87891 Personal history of nicotine dependence: Secondary | ICD-10-CM | POA: Diagnosis not present

## 2017-08-01 DIAGNOSIS — K5090 Crohn's disease, unspecified, without complications: Secondary | ICD-10-CM | POA: Diagnosis not present

## 2017-11-10 ENCOUNTER — Encounter: Payer: 59 | Admitting: Physician Assistant

## 2017-11-14 ENCOUNTER — Telehealth: Payer: Self-pay | Admitting: Physician Assistant

## 2017-11-14 NOTE — Telephone Encounter (Signed)
Received [employer] F[employer] form, placed form in providers box. Thanks CC

## 2017-12-05 ENCOUNTER — Encounter: Payer: Self-pay | Admitting: Physician Assistant

## 2017-12-05 ENCOUNTER — Ambulatory Visit (INDEPENDENT_AMBULATORY_CARE_PROVIDER_SITE_OTHER): Payer: BLUE CROSS/BLUE SHIELD | Admitting: Physician Assistant

## 2017-12-05 VITALS — BP 190/140 | HR 80 | Temp 98.7°F | Resp 20 | Ht 64.0 in | Wt 197.8 lb

## 2017-12-05 DIAGNOSIS — Z2821 Immunization not carried out because of patient refusal: Secondary | ICD-10-CM | POA: Diagnosis not present

## 2017-12-05 DIAGNOSIS — E119 Type 2 diabetes mellitus without complications: Secondary | ICD-10-CM

## 2017-12-05 DIAGNOSIS — I10 Essential (primary) hypertension: Secondary | ICD-10-CM

## 2017-12-05 DIAGNOSIS — Z6833 Body mass index (BMI) 33.0-33.9, adult: Secondary | ICD-10-CM

## 2017-12-05 DIAGNOSIS — Z0000 Encounter for general adult medical examination without abnormal findings: Secondary | ICD-10-CM

## 2017-12-05 DIAGNOSIS — E0590 Thyrotoxicosis, unspecified without thyrotoxic crisis or storm: Secondary | ICD-10-CM

## 2017-12-05 DIAGNOSIS — Z1231 Encounter for screening mammogram for malignant neoplasm of breast: Secondary | ICD-10-CM | POA: Diagnosis not present

## 2017-12-05 DIAGNOSIS — Z114 Encounter for screening for human immunodeficiency virus [HIV]: Secondary | ICD-10-CM

## 2017-12-05 DIAGNOSIS — Z1239 Encounter for other screening for malignant neoplasm of breast: Secondary | ICD-10-CM

## 2017-12-05 DIAGNOSIS — M722 Plantar fascial fibromatosis: Secondary | ICD-10-CM

## 2017-12-05 DIAGNOSIS — E7800 Pure hypercholesterolemia, unspecified: Secondary | ICD-10-CM

## 2017-12-05 LAB — POCT UA - MICROALBUMIN: Microalbumin Ur, POC: NEGATIVE mg/L

## 2017-12-05 MED ORDER — AMLODIPINE-VALSARTAN-HCTZ 10-160-25 MG PO TABS: 1.0000 | ORAL_TABLET | Freq: Every day | ORAL | 3 refills | Status: DC

## 2017-12-05 NOTE — Progress Notes (Signed)
Patient: Madison Ritter, Female    DOB: 09/16/1960, 57 y.o.   MRN: [id number] Visit Date: 12/05/2017  Today's Provider: Margaretann LovelessJennifer M Karlin Heilman, PA-C   Chief Complaint  Patient presents with  . Annual Exam   Subjective:    Annual physical exam Madison Ritter is a 57 y.o. female who presents today for health maintenance and complete physical. She feels well. She reports exercising. She reports she is sleeping well.  Last CPE:11/25/2016 Pap:11/25/16-Normal.HPV-Negative-Repeat 3-5 yrs. Colonoscopy:05/02/14-Repeat in 10 yrs. ----------------------------------------------------------------- Patient C/O persistent left heel pain x's 2-3 weeks. Worse with waking and standing for the first time.  Patient C/O on and off right ankle pain.  Patient C/O possible cyst on left breast x's 2-3 weeks. Patient reports some pain and denies any discharge. Improving now.  Review of Systems  Constitutional: Negative.   HENT: Negative.   Eyes: Positive for discharge and itching.  Respiratory: Negative.   Cardiovascular: Negative.   Gastrointestinal: Negative.   Endocrine: Negative.   Genitourinary: Negative.   Musculoskeletal: Positive for arthralgias and myalgias.  Skin: Negative.   Allergic/Immunologic: Negative.   Neurological: Negative.   Hematological: Negative.   Psychiatric/Behavioral: Negative.     Social History      She  reports that she has been smoking cigars. She has a 10.00 pack-year smoking history. She has never used smokeless tobacco. She reports that she drinks alcohol. She reports that she does not use drugs.       Social History   Socioeconomic History  . Marital status: Married    Spouse name: Not on file  . Number of children: 1  . Years of education: Not on file  . Highest education level: Not on file  Occupational History  . Not on file  Social Needs  . Financial resource strain: Not on file  . Food insecurity:    Worry: Not on file    Inability: Not on  file  . Transportation needs:    Medical: Not on file    Non-medical: Not on file  Tobacco Use  . Smoking status: Current Every Day Smoker    Packs/day: 1.00    Years: 10.00    Pack years: 10.00    Types: Cigars  . Smokeless tobacco: Never Used  . Tobacco comment: 1 cigar daily  Substance and Sexual Activity  . Alcohol use: Yes    Comment: occasional  . Drug use: No  . Sexual activity: Not on file  Lifestyle  . Physical activity:    Days per week: Not on file    Minutes per session: Not on file  . Stress: Not on file  Relationships  . Social connections:    Talks on phone: Not on file    Gets together: Not on file    Attends religious service: Not on file    Active member of club or organization: Not on file    Attends meetings of clubs or organizations: Not on file    Relationship status: Not on file  Other Topics Concern  . Not on file  Social History Narrative  . Not on file    History reviewed. No pertinent past medical history.   Patient Active Problem List   Diagnosis Date Noted  . Hematuria 08/18/2015  . Adult BMI 30+ 01/11/2015  . Diabetes mellitus type 2, uncomplicated (HCC) 01/11/2015  . Hypercholesteremia 01/11/2015  . Vitamin D deficiency 01/11/2015  . Carpal tunnel syndrome 04/22/2009  . Current tobacco use 02/13/2009  .  Essential (primary) hypertension 03/18/1998    Past Surgical History:  Procedure Laterality Date  . ABDOMINAL HYSTERECTOMY  2004  . TUBAL LIGATION      Family History        Family Status  Relation Name Status  . Mother  Alive  . Father  Deceased at age 57  . Sister  Alive  . Brother  Alive  . Brother  Alive  . Neg Hx  (Not Specified)        Her family history includes Cancer in her father; Congestive Heart Failure in her mother; Diabetes in her mother; Hypertension in her brother, brother, father, and mother; Sleep apnea in her mother; Stroke in her brother and mother; Thyroid disease in her sister. There is no history  of Breast cancer.      Allergies  Allergen Reactions  . Sulfa Antibiotics      Current Outpatient Medications:  .  amLODipine (NORVASC) 5 MG tablet, Take 2 tablets (10 mg total) by mouth daily., Disp: 90 tablet, Rfl: 3 .  hydrochlorothiazide (HYDRODIURIL) 12.5 MG tablet, TAKE 1 TABLET BY MOUTH  DAILY, Disp: 90 tablet, Rfl: 3 .  losartan (COZAAR) 100 MG tablet, TAKE 1 TABLET BY MOUTH  DAILY, Disp: 90 tablet, Rfl: 1 .  metFORMIN (GLUCOPHAGE) 500 MG tablet, Take 1 tablet (500 mg total) by mouth 2 (two) times daily with a meal., Disp: 180 tablet, Rfl: 3   Patient Care Team: Margaretann Loveless, PA-C as PCP - General (Family Medicine)      Objective:   Vitals: BP (!) 190/140 (BP Location: Left Arm, Patient Position: Sitting, Cuff Size: Large)   Pulse 80   Temp 98.7 F (37.1 C) (Oral)   Resp 20   Ht 5\' 4"  (1.626 m)   Wt 197 lb 12.8 oz (89.7 kg)   BMI 33.95 kg/m    Vitals:   12/05/17 1029  BP: (!) 190/140  Pulse: 80  Resp: 20  Temp: 98.7 F (37.1 C)  TempSrc: Oral  Weight: 197 lb 12.8 oz (89.7 kg)  Height: 5\' 4"  (1.626 m)     Physical Exam  Constitutional: She is oriented to person, place, and time. She appears well-developed and well-nourished. No distress.  HENT:  Head: Normocephalic and atraumatic.  Right Ear: Hearing, tympanic membrane, external ear and ear canal normal.  Left Ear: Hearing, tympanic membrane, external ear and ear canal normal.  Nose: Nose normal.  Mouth/Throat: Uvula is midline, oropharynx is clear and moist and mucous membranes are normal. No oropharyngeal exudate.  Eyes: Pupils are equal, round, and reactive to light. Conjunctivae and EOM are normal. Right eye exhibits no discharge. Left eye exhibits no discharge. No scleral icterus.  Neck: Normal range of motion. Neck supple. No JVD present. Carotid bruit is not present. No tracheal deviation present. No thyromegaly present.  Cardiovascular: Normal rate, regular rhythm, normal heart sounds and  intact distal pulses. Exam reveals no gallop and no friction rub.  No murmur heard. Pulmonary/Chest: Effort normal and breath sounds normal. No respiratory distress. She has no wheezes. She has no rales. She exhibits no tenderness. Right breast exhibits no inverted nipple, no mass, no nipple discharge, no skin change and no tenderness. Left breast exhibits no inverted nipple, no mass, no nipple discharge, no skin change and no tenderness. No breast tenderness, discharge or bleeding. Breasts are symmetrical.  Abdominal: Soft. Bowel sounds are normal. She exhibits no distension and no mass. There is no tenderness. There is no rebound and  no guarding. Hernia confirmed negative in the right inguinal area and confirmed negative in the left inguinal area.  Genitourinary: Rectum normal, vagina normal and uterus normal. No breast tenderness, discharge or bleeding. Pelvic exam was performed with patient supine. There is no rash, tenderness, lesion or injury on the right labia. There is no rash, tenderness, lesion or injury on the left labia. Cervix exhibits no motion tenderness, no discharge and no friability. Right adnexum displays no mass, no tenderness and no fullness. Left adnexum displays no mass, no tenderness and no fullness. No erythema, tenderness or bleeding in the vagina. No signs of injury around the vagina. No vaginal discharge found.  Musculoskeletal: Normal range of motion. She exhibits no edema or tenderness.  Lymphadenopathy:    She has no cervical adenopathy.       Right: No inguinal adenopathy present.       Left: No inguinal adenopathy present.  Neurological: She is alert and oriented to person, place, and time. She has normal reflexes. No cranial nerve deficit. Coordination normal.  Skin: Skin is warm and dry. No rash noted. She is not diaphoretic.  Psychiatric: She has a normal mood and affect. Her behavior is normal. Judgment and thought content normal.  Vitals reviewed.    Depression  Screen PHQ 2/9 Scores 12/05/2017 11/25/2016 08/18/2015  PHQ - 2 Score 0 0 0  PHQ- 9 Score 4 - -      Assessment & Plan:     Routine Health Maintenance and Physical Exam  Exercise Activities and Dietary recommendations Goals   None     Immunization History  Administered Date(s) Administered  . Influenza,inj,Quad PF,6+ Mos 11/29/2013, 01/09/2017  . Pneumococcal Polysaccharide-23 08/31/2015  . Tdap 08/18/2015    Health Maintenance  Topic Date Due  . HIV Screening  05/18/1977  . FOOT EXAM  12/05/2016  . HEMOGLOBIN A1C  03/04/2017  . INFLUENZA VACCINE  10/16/2017  . MAMMOGRAM  03/07/2018  . OPHTHALMOLOGY EXAM  04/14/2018  . PAP SMEAR  11/26/2019  . COLONOSCOPY  05/02/2024  . TETANUS/TDAP  08/17/2025  . PNEUMOCOCCAL POLYSACCHARIDE VACCINE AGE 40-64 HIGH RISK  Completed  . Hepatitis C Screening  Completed     Discussed health benefits of physical activity, and encouraged her to engage in regular exercise appropriate for her age and condition.    1. Annual physical exam Normal physical exam today. Will check labs as below and f/u pending lab results. If labs are stable and WNL she will not need to have these rechecked for one year at her next annual physical exam. She is to call the office in the meantime if she has any acute issue, questions or concerns.  2. Breast cancer screening Breast exam today was normal. There is no family history of breast cancer. She does perform regular self breast exams. Mammogram was ordered as below. Information for [facility] clinic was given to patient so she may schedule her mammogram at her convenience. - MM 3D SCREEN BREAST BILATERAL; Future  3. Type 2 diabetes mellitus without complication, without long-term current use of insulin (HCC) Microalbumin normal. Continue Metformin 500mg  BID. Will check labs as below and f/u pending results. - Hemoglobin A1c - POCT UA - Microalbumin  4. Hypercholesteremia Diet controlled. Patient  declines starting a statin. Will check labs as below and f/u pending results. - Lipid panel  5. Hyperthyroidism Will check labs as below and f/u pending results. - TSH  6. Declined influenza vaccination Will get through employer.  7.  Essential (primary) hypertension Uncontrolled. Will increase amlodipine to 10mg . Will also change losartan to valsartan as below to be able to give a combination pill. She is to send a mychart message with BP readings in the next 4-6 weeks.  - CBC with Differential/Platelet - Comprehensive metabolic panel - amLODIPine-Valsartan-HCTZ 10-160-25 MG TABS; Take 1 tablet by mouth daily.  Dispense: 90 tablet; Refill: 3  8. Screening for HIV (human immunodeficiency virus) Will check labs as below and f/u pending results. - HIV Antibody (routine testing w rflx)  9. BMI 33.0-33.9,adult Counseled patient on healthy lifestyle modifications including dieting and exercise.   10. Plantar fasciitis of left foot Worsening. Discussed conservative treatment with arch support, frozen water bottle massage, stretching. Call if no improvements.   --------------------------------------------------------------------    Margaretann Loveless, PA-C  [facility]

## 2017-12-05 NOTE — Patient Instructions (Signed)
Health Maintenance for Postmenopausal Women Menopause is a normal process in which your reproductive ability comes to an end. This process happens gradually over a span of months to years, usually between the ages of 22 and 9. Menopause is complete when you have missed 12 consecutive menstrual periods. It is important to talk with your health care provider about some of the most common conditions that affect postmenopausal women, such as heart disease, cancer, and bone loss (osteoporosis). Adopting a healthy lifestyle and getting preventive care can help to promote your health and wellness. Those actions can also lower your chances of developing some of these common conditions. What should I know about menopause? During menopause, you may experience a number of symptoms, such as:  Moderate-to-severe hot flashes.  Night sweats.  Decrease in sex drive.  Mood swings.  Headaches.  Tiredness.  Irritability.  Memory problems.  Insomnia.  Choosing to treat or not to treat menopausal changes is an individual decision that you make with your health care provider. What should I know about hormone replacement therapy and supplements? Hormone therapy products are effective for treating symptoms that are associated with menopause, such as hot flashes and night sweats. Hormone replacement carries certain risks, especially as you become older. If you are thinking about using estrogen or estrogen with progestin treatments, discuss the benefits and risks with your health care provider. What should I know about heart disease and stroke? Heart disease, heart attack, and stroke become more likely as you age. This may be due, in part, to the hormonal changes that your body experiences during menopause. These can affect how your body processes dietary fats, triglycerides, and cholesterol. Heart attack and stroke are both medical emergencies. There are many things that you can do to help prevent heart disease  and stroke:  Have your blood pressure checked at least every 1-2 years. High blood pressure causes heart disease and increases the risk of stroke.  If you are 53-22 years old, ask your health care provider if you should take aspirin to prevent a heart attack or a stroke.  Do not use any tobacco products, including cigarettes, chewing tobacco, or electronic cigarettes. If you need help quitting, ask your health care provider.  It is important to eat a healthy diet and maintain a healthy weight. ? Be sure to include plenty of vegetables, fruits, low-fat dairy products, and lean protein. ? Avoid eating foods that are high in solid fats, added sugars, or salt (sodium).  Get regular exercise. This is one of the most important things that you can do for your health. ? Try to exercise for at least 150 minutes each week. The type of exercise that you do should increase your heart rate and make you sweat. This is known as moderate-intensity exercise. ? Try to do strengthening exercises at least twice each week. Do these in addition to the moderate-intensity exercise.  Know your numbers.Ask your health care provider to check your cholesterol and your blood glucose. Continue to have your blood tested as directed by your health care provider.  What should I know about cancer screening? There are several types of cancer. Take the following steps to reduce your risk and to catch any cancer development as early as possible. Breast Cancer  Practice breast self-awareness. ? This means understanding how your breasts normally appear and feel. ? It also means doing regular breast self-exams. Let your health care provider know about any changes, no matter how small.  If you are 40  or older, have a clinician do a breast exam (clinical breast exam or CBE) every year. Depending on your age, family history, and medical history, it may be recommended that you also have a yearly breast X-ray (mammogram).  If you  have a family history of breast cancer, talk with your health care provider about genetic screening.  If you are at high risk for breast cancer, talk with your health care provider about having an MRI and a mammogram every year.  Breast cancer (BRCA) gene test is recommended for women who have family members with BRCA-related cancers. Results of the assessment will determine the need for genetic counseling and BRCA1 and for BRCA2 testing. BRCA-related cancers include these types: ? Breast. This occurs in males or females. ? Ovarian. ? Tubal. This may also be called fallopian tube cancer. ? Cancer of the abdominal or pelvic lining (peritoneal cancer). ? Prostate. ? Pancreatic.  Cervical, Uterine, and Ovarian Cancer Your health care provider may recommend that you be screened regularly for cancer of the pelvic organs. These include your ovaries, uterus, and vagina. This screening involves a pelvic exam, which includes checking for microscopic changes to the surface of your cervix (Pap test).  For women ages 21-65, health care providers may recommend a pelvic exam and a Pap test every three years. For women ages 79-65, they may recommend the Pap test and pelvic exam, combined with testing for human papilloma virus (HPV), every five years. Some types of HPV increase your risk of cervical cancer. Testing for HPV may also be done on women of any age who have unclear Pap test results.  Other health care providers may not recommend any screening for nonpregnant women who are considered low risk for pelvic cancer and have no symptoms. Ask your health care provider if a screening pelvic exam is right for you.  If you have had past treatment for cervical cancer or a condition that could lead to cancer, you need Pap tests and screening for cancer for at least 20 years after your treatment. If Pap tests have been discontinued for you, your risk factors (such as having a new sexual partner) need to be  reassessed to determine if you should start having screenings again. Some women have medical problems that increase the chance of getting cervical cancer. In these cases, your health care provider may recommend that you have screening and Pap tests more often.  If you have a family history of uterine cancer or ovarian cancer, talk with your health care provider about genetic screening.  If you have vaginal bleeding after reaching menopause, tell your health care provider.  There are currently no reliable tests available to screen for ovarian cancer.  Lung Cancer Lung cancer screening is recommended for adults 69-62 years old who are at high risk for lung cancer because of a history of smoking. A yearly low-dose CT scan of the lungs is recommended if you:  Currently smoke.  Have a history of at least 30 pack-years of smoking and you currently smoke or have quit within the past 15 years. A pack-year is smoking an average of one pack of cigarettes per day for one year.  Yearly screening should:  Continue until it has been 15 years since you quit.  Stop if you develop a health problem that would prevent you from having lung cancer treatment.  Colorectal Cancer  This type of cancer can be detected and can often be prevented.  Routine colorectal cancer screening usually begins at  age 42 and continues through age 45.  If you have risk factors for colon cancer, your health care provider may recommend that you be screened at an earlier age.  If you have a family history of colorectal cancer, talk with your health care provider about genetic screening.  Your health care provider may also recommend using home test kits to check for hidden blood in your stool.  A small camera at the end of a tube can be used to examine your colon directly (sigmoidoscopy or colonoscopy). This is done to check for the earliest forms of colorectal cancer.  Direct examination of the colon should be repeated every  5-10 years until age 71. However, if early forms of precancerous polyps or small growths are found or if you have a family history or genetic risk for colorectal cancer, you may need to be screened more often.  Skin Cancer  Check your skin from head to toe regularly.  Monitor any moles. Be sure to tell your health care provider: ? About any new moles or changes in moles, especially if there is a change in a mole's shape or color. ? If you have a mole that is larger than the size of a pencil eraser.  If any of your family members has a history of skin cancer, especially at a young age, talk with your health care provider about genetic screening.  Always use sunscreen. Apply sunscreen liberally and repeatedly throughout the day.  Whenever you are outside, protect yourself by wearing long sleeves, pants, a wide-brimmed hat, and sunglasses.  What should I know about osteoporosis? Osteoporosis is a condition in which bone destruction happens more quickly than new bone creation. After menopause, you may be at an increased risk for osteoporosis. To help prevent osteoporosis or the bone fractures that can happen because of osteoporosis, the following is recommended:  If you are 46-71 years old, get at least 1,000 mg of calcium and at least 600 mg of vitamin D per day.  If you are older than age 55 but younger than age 65, get at least 1,200 mg of calcium and at least 600 mg of vitamin D per day.  If you are older than age 54, get at least 1,200 mg of calcium and at least 800 mg of vitamin D per day.  Smoking and excessive alcohol intake increase the risk of osteoporosis. Eat foods that are rich in calcium and vitamin D, and do weight-bearing exercises several times each week as directed by your health care provider. What should I know about how menopause affects my mental health? Depression may occur at any age, but it is more common as you become older. Common symptoms of depression  include:  Low or sad mood.  Changes in sleep patterns.  Changes in appetite or eating patterns.  Feeling an overall lack of motivation or enjoyment of activities that you previously enjoyed.  Frequent crying spells.  Talk with your health care provider if you think that you are experiencing depression. What should I know about immunizations? It is important that you get and maintain your immunizations. These include:  Tetanus, diphtheria, and pertussis (Tdap) booster vaccine.  Influenza every year before the flu season begins.  Pneumonia vaccine.  Shingles vaccine.  Your health care provider may also recommend other immunizations. This information is not intended to replace advice given to you by your health care provider. Make sure you discuss any questions you have with your health care provider. Document Released: 04/26/2005  Document Revised: 09/22/2015 Document Reviewed: 12/06/2014 Elsevier Interactive Patient Education  2018 [employer]  

## 2017-12-06 LAB — COMPREHENSIVE METABOLIC PANEL
ALBUMIN: 4.1 g/dL (ref 3.5–5.5)
ALT: 24 IU/L (ref 0–32)
AST: 15 IU/L (ref 0–40)
Albumin/Globulin Ratio: 1.4 (ref 1.2–2.2)
Alkaline Phosphatase: 119 IU/L — ABNORMAL HIGH (ref 39–117)
BILIRUBIN TOTAL: 0.3 mg/dL (ref 0.0–1.2)
BUN / CREAT RATIO: 13 (ref 9–23)
BUN: 9 mg/dL (ref 6–24)
CALCIUM: 9.6 mg/dL (ref 8.7–10.2)
CHLORIDE: 102 mmol/L (ref 96–106)
CO2: 25 mmol/L (ref 20–29)
Creatinine, Ser: 0.7 mg/dL (ref 0.57–1.00)
GFR, EST AFRICAN AMERICAN: 113 mL/min/{1.73_m2} (ref >59)
GFR, EST NON AFRICAN AMERICAN: 98 mL/min/{1.73_m2} (ref >59)
GLUCOSE: 104 mg/dL — AB (ref 65–99)
Globulin, Total: 2.9 g/dL (ref 1.5–4.5)
Potassium: 3.9 mmol/L (ref 3.5–5.2)
Sodium: 140 mmol/L (ref 134–144)
TOTAL PROTEIN: 7 g/dL (ref 6.0–8.5)

## 2017-12-06 LAB — LIPID PANEL
Chol/HDL Ratio: 3.5 ratio (ref 0.0–4.4)
Cholesterol, Total: 204 mg/dL — ABNORMAL HIGH (ref 100–199)
HDL: 58 mg/dL (ref >39)
LDL Calculated: 122 mg/dL — ABNORMAL HIGH (ref 0–99)
TRIGLYCERIDES: 121 mg/dL (ref 0–149)
VLDL CHOLESTEROL CAL: 24 mg/dL (ref 5–40)

## 2017-12-06 LAB — CBC WITH DIFFERENTIAL/PLATELET
BASOS: 1 %
Basophils Absolute: 0.1 10*3/uL (ref 0.0–0.2)
EOS (ABSOLUTE): 0.2 10*3/uL (ref 0.0–0.4)
Eos: 2 %
HEMOGLOBIN: 13.9 g/dL (ref 11.1–15.9)
Hematocrit: 41.4 % (ref 34.0–46.6)
IMMATURE GRANS (ABS): 0 10*3/uL (ref 0.0–0.1)
IMMATURE GRANULOCYTES: 0 %
LYMPHS: 36 %
Lymphocytes Absolute: 4 10*3/uL — ABNORMAL HIGH (ref 0.7–3.1)
MCH: 29.8 pg (ref 26.6–33.0)
MCHC: 33.6 g/dL (ref 31.5–35.7)
MCV: 89 fL (ref 79–97)
MONOCYTES: 7 %
Monocytes Absolute: 0.8 10*3/uL (ref 0.1–0.9)
NEUTROS PCT: 54 %
Neutrophils Absolute: 6.1 10*3/uL (ref 1.4–7.0)
Platelets: 339 10*3/uL (ref 150–450)
RBC: 4.67 x10E6/uL (ref 3.77–5.28)
RDW: 12.3 % (ref 12.3–15.4)
WBC: 11.2 10*3/uL — ABNORMAL HIGH (ref 3.4–10.8)

## 2017-12-06 LAB — TSH: TSH: 0.585 u[IU]/mL (ref 0.450–4.500)

## 2017-12-06 LAB — HIV ANTIBODY (ROUTINE TESTING W REFLEX): HIV Screen 4th Generation wRfx: NONREACTIVE

## 2017-12-06 LAB — HEMOGLOBIN A1C
Est. average glucose Bld gHb Est-mCnc: 154 mg/dL
Hgb A1c MFr Bld: 7 % — ABNORMAL HIGH (ref 4.8–5.6)

## 2017-12-08 ENCOUNTER — Telehealth: Payer: Self-pay

## 2017-12-08 NOTE — Telephone Encounter (Signed)
-----  Message from Mar Daring, [place] sent at 12/08/2017 12:59 PM EDT ----- WBC count borderline elevated which can be secondary to stress or possible viral URI or UTI. We can recheck in 2-4 weeks to see if it returns to baseline. Fasting sugar OK at 104. Alk phos level essentially normal. Kidney and liver function normal. A1c at 7.0 down from 7.1 last year. Cholesterol still borderline high but stable. Thyroid normal range. HIV screen negative.

## 2017-12-08 NOTE — Telephone Encounter (Signed)
Patient advised as below. Patient verbalizes understanding and is in agreement with treatment plan.  

## 2017-12-15 ENCOUNTER — Other Ambulatory Visit: Payer: Self-pay | Admitting: Physician Assistant

## 2017-12-15 DIAGNOSIS — N6320 Unspecified lump in the left breast, unspecified quadrant: Secondary | ICD-10-CM

## 2018-01-02 ENCOUNTER — Other Ambulatory Visit: Payer: 59

## 2018-01-08 ENCOUNTER — Ambulatory Visit
Admission: RE | Admit: 2018-01-08 | Discharge: 2018-01-08 | Disposition: A | Discharge status: Home / Self Care | Payer: Self-pay | Source: Ambulatory Visit | Attending: Physician Assistant | Admitting: Physician Assistant

## 2018-01-08 DIAGNOSIS — N6320 Unspecified lump in the left breast, unspecified quadrant: Secondary | ICD-10-CM

## 2018-04-24 ENCOUNTER — Ambulatory Visit: Payer: BLUE CROSS/BLUE SHIELD | Admitting: Podiatry

## 2018-04-24 ENCOUNTER — Encounter: Payer: Self-pay | Admitting: Podiatry

## 2018-04-24 DIAGNOSIS — L600 Ingrowing nail: Secondary | ICD-10-CM

## 2018-04-24 MED ORDER — GENTAMICIN SULFATE 0.1 % EX CREA: 1.0000 "application " | TOPICAL_CREAM | Freq: Two times a day (BID) | CUTANEOUS | 1 refills | Status: DC

## 2018-04-24 NOTE — Patient Instructions (Signed)

## 2018-04-26 NOTE — Progress Notes (Signed)
   Subjective: Patient presents today for evaluation of pain to the medial border of the right hallux that began 2-3 months ago. Patient is concerned for possible ingrown nail. Touching and applying pressure to the toe increases the pain. She has not done anything for treatment. Patient presents today for further treatment and evaluation.  History reviewed. No pertinent past medical history.  Objective:  General: Well developed, nourished, in no acute distress, alert and oriented x3   Dermatology: Skin is warm, dry and supple bilateral. Medial border of the right hallux appears to be erythematous with evidence of an ingrowing nail. Pain on palpation noted to the border of the nail fold. The remaining nails appear unremarkable at this time. There are no open sores, lesions.  Vascular: Dorsalis Pedis artery and Posterior Tibial artery pedal pulses palpable. No lower extremity edema noted.   Neruologic: Grossly intact via light touch bilateral.  Musculoskeletal: Muscular strength within normal limits in all groups bilateral. Normal range of motion noted to all pedal and ankle joints.   Assesement: #1 Paronychia with ingrowing nail medial border right hallux  #2 Pain in toe #3 Incurvated nail  Plan of Care:  1. Patient evaluated.  2. Discussed treatment alternatives and plan of care. Explained nail avulsion procedure and post procedure course to patient. 3. Patient opted for permanent partial nail avulsion of the medial border of the right hallux.  4. Prior to procedure, local anesthesia infiltration utilized using 3 ml of a 50:50 mixture of 2% plain lidocaine and 0.5% plain marcaine in a normal hallux block fashion and a betadine prep performed.  5. Partial permanent nail avulsion with chemical matrixectomy performed using 3x30sec applications of phenol followed by alcohol flush.  6. Light dressing applied. 7. Prescription for Gentamicin cream provided to patient to use daily with a  bandage.  8. Return to clinic in 2 weeks.   Felecia Shelling, DPM [facility]  Dr. Felecia Shelling, DPM    [address]                                        [place], [place] [address]                Office [phone]  Fax [phone]

## 2018-05-15 ENCOUNTER — Ambulatory Visit: Payer: BLUE CROSS/BLUE SHIELD | Admitting: Podiatry

## 2018-05-15 ENCOUNTER — Encounter: Payer: Self-pay | Admitting: Podiatry

## 2018-05-15 DIAGNOSIS — L600 Ingrowing nail: Secondary | ICD-10-CM

## 2018-05-19 NOTE — Progress Notes (Signed)
   Subjective: Patient presents status post ingrown nail permanent nail avulsion procedure of the medial border of the right hallux that was done on 04/24/2018. Patient states that the toe and nail fold is feeling much better but she does reports some mild tenderness today. Touching the area increases the pain. She has been using the Gentamicin cream as directed. Patient is here for further evaluation and treatment.   History reviewed. No pertinent past medical history.  Objective: Skin is warm, dry and supple. Nail and respective nail fold appears to be healing appropriately. Open wound to the associated nail fold with a granular wound base and moderate amount of fibrotic tissue. Minimal drainage noted. Mild erythema around the periungual region likely due to phenol chemical matricectomy.  Assessment: #1 postop permanent partial nail avulsion medial border right hallux  #2 open wound periungual nail fold of respective digit.   Plan of care: #1 patient was evaluated  #2 debridement of open wound was performed to the periungual border of the respective toe using a currette. Antibiotic ointment and Band-Aid was applied. #3 patient is to return to clinic on a PRN basis.   Felecia Shelling, DPM [facility]  Dr. Felecia Shelling, DPM    [address]                                        [place], [place] [address]                Office [phone]  Fax [phone]

## 2019-01-06 NOTE — Progress Notes (Signed)
Patient: Madison Ritter, Female    DOB: 10/20/1960, 58 y.o.   MRN: [id number] Visit Date: 01/07/2019  Today's Provider: Margaretann LovelessJennifer M Burnette, PA-C   Chief Complaint  Patient presents with  . Annual Exam   Subjective:     Annual physical exam Madison Ritter is a 58 y.o. female who presents today for health maintenance and complete physical. She feels well. She reports exercising none. She reports she is sleeping well.  .  11/27/2016- Pap is normal, HPV negative. Will repeat in 5 years. 01/09/2018-Normal mammo. 05/02/14-Colonoscopy: 2 hyperplastic polyps. Repeat 10 years   Review of Systems  Constitutional: Negative.   HENT: Negative.   Eyes: Negative.   Respiratory: Negative.   Cardiovascular: Negative.   Gastrointestinal: Negative.   Endocrine: Negative.   Genitourinary: Negative.   Musculoskeletal: Negative.   Skin: Negative.   Allergic/Immunologic: Negative.   Neurological: Negative.   Hematological: Negative.   Psychiatric/Behavioral: Negative.     Social History      She  reports that she has been smoking cigars. She has a 10.00 pack-year smoking history. She has never used smokeless tobacco. She reports current alcohol use. She reports that she does not use drugs.       Social History   Socioeconomic History  . Marital status: Married    Spouse name: Not on file  . Number of children: 1  . Years of education: Not on file  . Highest education level: Not on file  Occupational History  . Not on file  Social Needs  . Financial resource strain: Not on file  . Food insecurity    Worry: Not on file    Inability: Not on file  . Transportation needs    Medical: Not on file    Non-medical: Not on file  Tobacco Use  . Smoking status: Current Every Day Smoker    Packs/day: 1.00    Years: 10.00    Pack years: 10.00    Types: Cigars  . Smokeless tobacco: Never Used  . Tobacco comment: 1 cigar daily  Substance and Sexual Activity  . Alcohol use: Yes   Comment: occasional  . Drug use: No  . Sexual activity: Not on file  Lifestyle  . Physical activity    Days per week: Not on file    Minutes per session: Not on file  . Stress: Not on file  Relationships  . Social Musicianconnections    Talks on phone: Not on file    Gets together: Not on file    Attends religious service: Not on file    Active member of club or organization: Not on file    Attends meetings of clubs or organizations: Not on file    Relationship status: Not on file  Other Topics Concern  . Not on file  Social History Narrative  . Not on file    History reviewed. No pertinent past medical history.   Patient Active Problem List   Diagnosis Date Noted  . Hematuria 08/18/2015  . Adult BMI 30+ 01/11/2015  . Diabetes mellitus type 2, uncomplicated (HCC) 01/11/2015  . Hypercholesteremia 01/11/2015  . Vitamin D deficiency 01/11/2015  . Carpal tunnel syndrome 04/22/2009  . Current tobacco use 02/13/2009  . Essential (primary) hypertension 03/18/1998    Past Surgical History:  Procedure Laterality Date  . ABDOMINAL HYSTERECTOMY  2004  . TUBAL LIGATION      Family History        Family Status  Relation Name Status  . Mother  Alive  . Father  Deceased at age 75  . Sister  Alive  . Brother  Alive  . Brother  Alive  . Mat Aunt  (Not Specified)  . Other gr aunt Alive        Her family history includes Breast cancer in an other family member; Breast cancer (age of onset: 59) in her maternal aunt; Cancer in her father; Congestive Heart Failure in her mother; Diabetes in her mother; Hypertension in her brother, brother, father, and mother; Sleep apnea in her mother; Stroke in her brother and mother; Thyroid disease in her sister.      Allergies  Allergen Reactions  . Sulfa Antibiotics      Current Outpatient Medications:  .  amLODIPine-Valsartan-HCTZ 10-160-25 MG TABS, Take 1 tablet by mouth daily., Disp: 90 tablet, Rfl: 3 .  metFORMIN (GLUCOPHAGE) 500 MG  tablet, Take 1 tablet (500 mg total) by mouth 2 (two) times daily with a meal. (Patient not taking: Reported on 01/07/2019), Disp: 180 tablet, Rfl: 3   Patient Care Team: Rubye Beach as PCP - General (Family Medicine)    Objective:    Vitals: BP (!) 153/109   Pulse 89   Temp (!) 97.3 F (36.3 C) (Temporal)   Resp 16   Ht 5\' 4"  (1.626 m)   Wt 191 lb 9.6 oz (86.9 kg)   BMI 32.89 kg/m    Vitals:   01/07/19 1329 01/07/19 1333  BP: (!) 162/110 (!) 153/109  Pulse: 89   Resp: 16   Temp: (!) 97.3 F (36.3 C)   TempSrc: Temporal   Weight: 191 lb 9.6 oz (86.9 kg)   Height: 5\' 4"  (1.626 m)      Physical Exam Vitals signs reviewed.  Constitutional:      General: She is not in acute distress.    Appearance: Normal appearance. She is well-developed. She is obese. She is not ill-appearing or diaphoretic.  HENT:     Head: Normocephalic and atraumatic.     Right Ear: Tympanic membrane, ear canal and external ear normal.     Left Ear: Tympanic membrane, ear canal and external ear normal.     Nose: Nose normal.     Mouth/Throat:     Mouth: Mucous membranes are moist.     Pharynx: Oropharynx is clear. No oropharyngeal exudate.  Eyes:     General: No scleral icterus.       Right eye: No discharge.        Left eye: No discharge.     Extraocular Movements: Extraocular movements intact.     Conjunctiva/sclera: Conjunctivae normal.     Pupils: Pupils are equal, round, and reactive to light.  Neck:     Musculoskeletal: Normal range of motion and neck supple.     Thyroid: No thyromegaly.     Vascular: No carotid bruit or JVD.     Trachea: No tracheal deviation.  Cardiovascular:     Rate and Rhythm: Normal rate and regular rhythm.     Pulses: Normal pulses.     Heart sounds: Normal heart sounds. No murmur. No friction rub. No gallop.   Pulmonary:     Effort: Pulmonary effort is normal. No respiratory distress.     Breath sounds: Normal breath sounds. No wheezing or  rales.  Chest:     Chest wall: No tenderness.     Breasts:        Right: Normal.  Left: Mass present. No nipple discharge, skin change or tenderness.    Abdominal:     General: Bowel sounds are normal. There is no distension.     Palpations: Abdomen is soft. There is no mass.     Tenderness: There is no abdominal tenderness. There is no guarding or rebound.  Musculoskeletal: Normal range of motion.        General: No tenderness.     Right lower leg: No edema.     Left lower leg: No edema.  Lymphadenopathy:     Cervical: No cervical adenopathy.     Upper Body:     Right upper body: No supraclavicular, axillary or pectoral adenopathy.     Left upper body: No supraclavicular, axillary or pectoral adenopathy.  Skin:    General: Skin is warm and dry.     Capillary Refill: Capillary refill takes less than 2 seconds.     Findings: No rash.  Neurological:     General: No focal deficit present.     Mental Status: She is alert and oriented to person, place, and time. Mental status is at baseline.  Psychiatric:        Mood and Affect: Mood normal.        Behavior: Behavior normal.        Thought Content: Thought content normal.        Judgment: Judgment normal.     Diabetic Foot Exam - Simple   Simple Foot Form Diabetic Foot exam was performed with the following findings: Yes 01/07/2019  2:17 PM  Visual Inspection No deformities, no ulcerations, no other skin breakdown bilaterally: Yes Sensation Testing Intact to touch and monofilament testing bilaterally: Yes Pulse Check Posterior Tibialis and Dorsalis pulse intact bilaterally: Yes Comments     Depression Screen PHQ 2/9 Scores 12/05/2017 11/25/2016 08/18/2015  PHQ - 2 Score 0 0 0  PHQ- 9 Score 4 - -       Assessment & Plan:     Routine Health Maintenance and Physical Exam  Exercise Activities and Dietary recommendations Goals   None     Immunization History  Administered Date(s) Administered  .  Influenza,inj,Quad PF,6+ Mos 11/29/2013, 01/09/2017  . Pneumococcal Polysaccharide-23 08/31/2015  . Tdap 08/18/2015    Health Maintenance  Topic Date Due  . FOOT EXAM  12/05/2016  . HEMOGLOBIN A1C  03/06/2018  . OPHTHALMOLOGY EXAM  04/14/2018  . INFLUENZA VACCINE  10/17/2018  . PAP SMEAR-Modifier  11/26/2019  . MAMMOGRAM  01/09/2020  . COLONOSCOPY  05/02/2024  . TETANUS/TDAP  08/17/2025  . PNEUMOCOCCAL POLYSACCHARIDE VACCINE AGE 41-64 HIGH RISK  Completed  . Hepatitis C Screening  Completed  . HIV Screening  Completed     Discussed health benefits of physical activity, and encouraged her to engage in regular exercise appropriate for her age and condition.    1. Annual physical exam Normal physical exam, with exception of left breast changes noted below, today. Will check labs as below and f/u pending lab results. If labs are stable and WNL she will not need to have these rechecked for one year at her next annual physical exam. She is to call the office in the meantime if she has any acute issue, questions or concerns.  2. Encounter for screening mammogram for malignant neoplasm of breast Breast exam today was abnormal. There is a mass/cyst noted in the left nipple in the 6 o'clock position. There is no redness, tenderness, or nipple discharge. There is family history of  breast cancer. She does perform regular self breast exams. Mammogram was ordered as below. Information for [facility] was given to patient so she may schedule her mammogram at her convenience. - MM DIAG BREAST TOMO BILATERAL; Future - US BREAST LTD UNI LEFT INC AXILLA; Future  3. Cyst of nipple, left See above medical treatment plan. - MM DIAG BREAST TOMO BILATERAL; Future - US BREAST LTD UNI LEFT INC AXILLA; Future  4. Family history of breast cancer in female See above medical treatment plan. - MM DIAG BREAST TOMO BILATERAL; Future - US BREAST LTD UNI LEFT INC AXILLA; Future  5. Type 2 diabetes  mellitus without complication, without long-term current use of insulin (HCC) Diet controlled. Will check labs as below and f/u pending results. - Hemoglobin A1c  6. Hypercholesteremia Diet controlled. Will check labs as below and f/u pending results. - Lipid Panel With LDL/HDL Ratio  7. Hyperthyroidism Stable. No symptoms. Will check labs as below and f/u pending results. - TSH  8. Essential (primary) hypertension Elevated today. Continue amlodipine-valsartan-hctz 10-160-25mg . Add metoprolol XR  daily. Return in 4 weeks for BP recheck. Will check labs as below and f/u pending results. - CBC with Differential/Platelet - Comprehensive metabolic panel - metoprolol succinate (TOPROL-XL) 50 MG 24 hr tablet; Take 1 tablet (50 mg total) by mouth daily. Take with or immediately following a meal.  Dispense: 90 tablet; Refill: 3 - amLODIPine-Valsartan-HCTZ 10-160-25 MG TABS; Take 1 tablet by mouth daily.  Dispense: 90 tablet; Refill: 3  9. Multinodular thyroid Was to be imaged annually x 5 years. Last imaged in 2018. Will repeat as below and f/u pending results.  - US THYROID; Future  10. Class 1 obesity due to excess calories with serious comorbidity and body mass index (BMI) of 32.0 to 32.9 in adult Counseled patient on healthy lifestyle modifications including dieting and exercise.   11. Menopausal syndrome (hot flashes) Worsening. Discussed black cohosh for symptoms. If not improving can address other treatments in 4 weeks when she returns for BP f/u.   12. Need for influenza vaccination Flu vaccine given today without complication. Patient sat upright for 15 minutes to check for adverse reaction before being released. - Flu Vaccine QUAD 36+ mos IM  --------------------------------------------------------------------    Margaretann Loveless, PA-C  [facility]

## 2019-01-07 ENCOUNTER — Encounter: Payer: Self-pay | Admitting: Physician Assistant

## 2019-01-07 ENCOUNTER — Other Ambulatory Visit: Payer: Self-pay

## 2019-01-07 ENCOUNTER — Ambulatory Visit (INDEPENDENT_AMBULATORY_CARE_PROVIDER_SITE_OTHER): Payer: BC Managed Care – PPO | Admitting: Physician Assistant

## 2019-01-07 VITALS — BP 153/109 | HR 89 | Temp 97.3°F | Resp 16 | Ht 64.0 in | Wt 191.6 lb

## 2019-01-07 DIAGNOSIS — E7800 Pure hypercholesterolemia, unspecified: Secondary | ICD-10-CM | POA: Diagnosis not present

## 2019-01-07 DIAGNOSIS — Z6832 Body mass index (BMI) 32.0-32.9, adult: Secondary | ICD-10-CM

## 2019-01-07 DIAGNOSIS — E6609 Other obesity due to excess calories: Secondary | ICD-10-CM

## 2019-01-07 DIAGNOSIS — E042 Nontoxic multinodular goiter: Secondary | ICD-10-CM

## 2019-01-07 DIAGNOSIS — Z23 Encounter for immunization: Secondary | ICD-10-CM | POA: Diagnosis not present

## 2019-01-07 DIAGNOSIS — N6002 Solitary cyst of left breast: Secondary | ICD-10-CM | POA: Diagnosis not present

## 2019-01-07 DIAGNOSIS — Z803 Family history of malignant neoplasm of breast: Secondary | ICD-10-CM | POA: Diagnosis not present

## 2019-01-07 DIAGNOSIS — E0590 Thyrotoxicosis, unspecified without thyrotoxic crisis or storm: Secondary | ICD-10-CM

## 2019-01-07 DIAGNOSIS — Z0000 Encounter for general adult medical examination without abnormal findings: Secondary | ICD-10-CM | POA: Diagnosis not present

## 2019-01-07 DIAGNOSIS — Z1231 Encounter for screening mammogram for malignant neoplasm of breast: Secondary | ICD-10-CM | POA: Diagnosis not present

## 2019-01-07 DIAGNOSIS — E119 Type 2 diabetes mellitus without complications: Secondary | ICD-10-CM

## 2019-01-07 DIAGNOSIS — I10 Essential (primary) hypertension: Secondary | ICD-10-CM

## 2019-01-07 DIAGNOSIS — N951 Menopausal and female climacteric states: Secondary | ICD-10-CM

## 2019-01-07 MED ORDER — METOPROLOL SUCCINATE ER 50 MG PO TB24: 50.0000 mg | ORAL_TABLET | Freq: Every day | ORAL | 3 refills | Status: DC

## 2019-01-07 MED ORDER — AMLODIPINE-VALSARTAN-HCTZ 10-160-25 MG PO TABS: 1.0000 | ORAL_TABLET | Freq: Every day | ORAL | 3 refills | Status: DC

## 2019-01-07 NOTE — Patient Instructions (Signed)
Metoprolol extended-release capsules What is this medicine? METOPROLOL (me TOE proe lole) is a beta-blocker. Beta-blockers reduce the workload on the heart and help it to beat more regularly. This medicine is used to treat high blood pressure and to prevent chest pain. It is also used after a heart attack to prevent an additional heart attack from occurring. This medicine may be used for other purposes; ask your health care provider or pharmacist if you have questions. COMMON BRAND NAME(S): KAPSPARGO What should I tell my health care provider before I take this medicine? They need to know if you have any of these conditions:  diabetes  heart disease  liver disease  lung or breathing disease, like asthma  pheochromocytoma  thyroid disease  an unusual or allergic reaction to metoprolol, other beta-blockers, medicines, foods, dyes, or preservatives  pregnant or trying to get pregnant  breast-feeding How should I use this medicine? Take this medicine by mouth. The capsules can be swallowed whole or opened carefully and the contents sprinkled over a small amount (teaspoonful) of soft food, such as applesauce, pudding, or yogurt. This mixture must be swallowed within 60 minutes and not stored for future use. Do not chew this medicine. You can take it with or without food. If it upsets your stomach, take it with food. Take your medicine at regular intervals. Do not take it more often than directed. Do not stop taking except on your doctor's advice. Talk to your pediatrician regarding the use of this medicine in children. While this drug may be prescribed for children as young as 6 for selected conditions, precautions do apply. Overdosage: If you think you have taken too much of this medicine contact a poison control center or emergency room at once. NOTE: This medicine is only for you. Do not share this medicine with others. What if I miss a dose? If you miss a dose, take it as soon as you  can. If it is almost time for your next dose, take only that dose. Do not take double or extra doses. What may interact with this medicine? This medicine may interact with the following medications:  certain medicines for blood pressure, heart disease, irregular heart beat  epinephrine  fluoxetine  MAOIs like Carbex, Eldepryl, Marplan, Nardil, and Parnate  paroxetine  reserpine This list may not describe all possible interactions. Give your health care provider a list of all the medicines, herbs, non-prescription drugs, or dietary supplements you use. Also tell them if you smoke, drink alcohol, or use illegal drugs. Some items may interact with your medicine. What should I watch for while using this medicine? You may get drowsy or dizzy. Do not drive, use machinery, or do anything that needs mental alertness until you know how this medicine affects you. Do not stand or sit up quickly, especially if you are an older patient. This reduces the risk of dizzy or fainting spells. Alcohol may interfere with the effect of this medicine. Avoid alcoholic drinks. Visit your doctor or health care professional for regular checks on your progress. Check your blood pressure as directed. Ask your doctor or health care professional what your blood pressure should be and when you should contact him or her. Do not treat yourself for coughs, colds, or pain while you are using this medicine without asking your doctor or health care professional for advice. Some ingredients may increase your blood pressure. This medicine may increase blood sugar. Ask your healthcare provider if changes in diet or medicines are needed   if you have diabetes. What side effects may I notice from receiving this medicine? Side effects that you should report to your doctor or health care professional as soon as possible:  allergic reactions like skin rash, itching or hives, swelling of the face, lips, or tongue  cold hands or  feet  signs and symptoms of high blood sugar such as being more thirsty or hungry or having to urinate more than normal. You may also feel very tired or have blurry vision.  signs and symptoms of low blood pressure like dizziness; feeling faint or lightheaded, falls; unusually weak or tired  signs of worsening heart failure like breathing problems, swelling in your legs and feet  suicidal thoughts or other mood changes  unusually slow heartbeat Side effects that usually do not require medical attention (report these to your doctor or health care professional if they continue or are bothersome):  anxious  change in sex drive or performance  diarrhea  headache  trouble sleeping  upset stomach This list may not describe all possible side effects. Call your doctor for medical advice about side effects. You may report side effects to FDA at 1-800-FDA-1088. Where should I keep my medicine? Keep out of the reach of children. Store at room temperature between 15 and 30 degrees C (59 and 86 degrees F). Throw away any unused medicine after the expiration date. NOTE: This sheet is a summary. It may not cover all possible information. If you have questions about this medicine, talk to your doctor, pharmacist, or health care provider.  2020 Elsevier/Gold Standard (2017-12-23 11:07:10)  

## 2019-01-08 ENCOUNTER — Telehealth: Payer: Self-pay | Admitting: Physician Assistant

## 2019-01-08 LAB — COMPREHENSIVE METABOLIC PANEL
ALT: 26 IU/L (ref 0–32)
AST: 14 IU/L (ref 0–40)
Albumin/Globulin Ratio: 1.3 (ref 1.2–2.2)
Albumin: 4.3 g/dL (ref 3.8–4.9)
Alkaline Phosphatase: 135 IU/L — ABNORMAL HIGH (ref 39–117)
BUN/Creatinine Ratio: 14 (ref 9–23)
BUN: 11 mg/dL (ref 6–24)
Bilirubin Total: 0.3 mg/dL (ref 0.0–1.2)
CO2: 23 mmol/L (ref 20–29)
Calcium: 10.2 mg/dL (ref 8.7–10.2)
Chloride: 100 mmol/L (ref 96–106)
Creatinine, Ser: 0.78 mg/dL (ref 0.57–1.00)
GFR calc Af Amer: 98 mL/min/{1.73_m2} (ref >59)
GFR calc non Af Amer: 85 mL/min/{1.73_m2} (ref >59)
Globulin, Total: 3.2 g/dL (ref 1.5–4.5)
Glucose: 136 mg/dL — ABNORMAL HIGH (ref 65–99)
Potassium: 3.4 mmol/L — ABNORMAL LOW (ref 3.5–5.2)
Sodium: 138 mmol/L (ref 134–144)
Total Protein: 7.5 g/dL (ref 6.0–8.5)

## 2019-01-08 LAB — CBC WITH DIFFERENTIAL/PLATELET
Basophils Absolute: 0.1 10*3/uL (ref 0.0–0.2)
Basos: 1 %
EOS (ABSOLUTE): 0.3 10*3/uL (ref 0.0–0.4)
Eos: 2 %
Hematocrit: 41 % (ref 34.0–46.6)
Hemoglobin: 14.1 g/dL (ref 11.1–15.9)
Immature Grans (Abs): 0 10*3/uL (ref 0.0–0.1)
Immature Granulocytes: 0 %
Lymphocytes Absolute: 5 10*3/uL — ABNORMAL HIGH (ref 0.7–3.1)
Lymphs: 38 %
MCH: 30.7 pg (ref 26.6–33.0)
MCHC: 34.4 g/dL (ref 31.5–35.7)
MCV: 89 fL (ref 79–97)
Monocytes Absolute: 0.9 10*3/uL (ref 0.1–0.9)
Monocytes: 7 %
Neutrophils Absolute: 6.7 10*3/uL (ref 1.4–7.0)
Neutrophils: 52 %
Platelets: 353 10*3/uL (ref 150–450)
RBC: 4.59 x10E6/uL (ref 3.77–5.28)
RDW: 12.7 % (ref 11.7–15.4)
WBC: 13 10*3/uL — ABNORMAL HIGH (ref 3.4–10.8)

## 2019-01-08 LAB — HEMOGLOBIN A1C
Est. average glucose Bld gHb Est-mCnc: 174 mg/dL
Hgb A1c MFr Bld: 7.7 % — ABNORMAL HIGH (ref 4.8–5.6)

## 2019-01-08 LAB — LIPID PANEL WITH LDL/HDL RATIO
Cholesterol, Total: 214 mg/dL — ABNORMAL HIGH (ref 100–199)
HDL: 56 mg/dL (ref >39)
LDL Chol Calc (NIH): 124 mg/dL — ABNORMAL HIGH (ref 0–99)
LDL/HDL Ratio: 2.2 ratio (ref 0.0–3.2)
Triglycerides: 195 mg/dL — ABNORMAL HIGH (ref 0–149)
VLDL Cholesterol Cal: 34 mg/dL (ref 5–40)

## 2019-01-08 LAB — TSH: TSH: 0.574 u[IU]/mL (ref 0.450–4.500)

## 2019-01-08 NOTE — Telephone Encounter (Signed)
Please review. Thanks!  

## 2019-01-08 NOTE — Telephone Encounter (Signed)
Madison Ritter will need to know clock position of breast cyst,Thanks

## 2019-01-11 NOTE — Progress Notes (Signed)
Sent pt her results and lab review from provider to her mychart

## 2019-01-15 ENCOUNTER — Other Ambulatory Visit: Payer: Self-pay

## 2019-01-15 ENCOUNTER — Ambulatory Visit
Admission: RE | Admit: 2019-01-15 | Discharge: 2019-01-15 | Disposition: A | Discharge status: Home / Self Care | Payer: BC Managed Care – PPO | Source: Ambulatory Visit | Attending: Physician Assistant | Admitting: Physician Assistant

## 2019-01-15 DIAGNOSIS — E042 Nontoxic multinodular goiter: Secondary | ICD-10-CM | POA: Diagnosis not present

## 2019-01-19 ENCOUNTER — Telehealth: Payer: Self-pay

## 2019-01-19 NOTE — Telephone Encounter (Signed)
-----  Message from [place], [place] sent at 01/19/2019  1:53 PM EST ----- Radiologist impression is that this thyroid ultrasound is an improvement over the test done in 2018 and down graded the nodule. Radiologist did not feel this met the criteria for any biopsy or follow up other than lab tests annually.

## 2019-01-19 NOTE — Telephone Encounter (Signed)
Patient advised as below.  

## 2019-01-21 ENCOUNTER — Ambulatory Visit
Admission: RE | Admit: 2019-01-21 | Discharge: 2019-01-21 | Disposition: A | Discharge status: Home / Self Care | Payer: BC Managed Care – PPO | Source: Ambulatory Visit | Attending: Physician Assistant | Admitting: Physician Assistant

## 2019-01-21 DIAGNOSIS — Z1231 Encounter for screening mammogram for malignant neoplasm of breast: Secondary | ICD-10-CM

## 2019-01-21 DIAGNOSIS — N6002 Solitary cyst of left breast: Secondary | ICD-10-CM | POA: Insufficient documentation

## 2019-01-21 DIAGNOSIS — Z803 Family history of malignant neoplasm of breast: Secondary | ICD-10-CM | POA: Diagnosis not present

## 2019-01-21 DIAGNOSIS — R928 Other abnormal and inconclusive findings on diagnostic imaging of breast: Secondary | ICD-10-CM | POA: Diagnosis not present

## 2019-01-21 DIAGNOSIS — N6489 Other specified disorders of breast: Secondary | ICD-10-CM | POA: Diagnosis not present

## 2019-01-25 ENCOUNTER — Telehealth: Payer: Self-pay

## 2019-01-25 DIAGNOSIS — N6042 Mammary duct ectasia of left breast: Secondary | ICD-10-CM

## 2019-01-25 MED ORDER — AMOXICILLIN-POT CLAVULANATE 875-125 MG PO TABS: 1.0000 | ORAL_TABLET | Freq: Two times a day (BID) | ORAL | 0 refills | Status: DC

## 2019-01-25 NOTE — Telephone Encounter (Signed)
Patient advised as directed below. Per patient her nipple still the same with no improvement.

## 2019-01-25 NOTE — Telephone Encounter (Signed)
-----   Message from Mar Daring, [place] sent at 01/25/2019 10:56 AM EST ----- Breast exam is unremarkable. The spot on the nipple is suspected to be a blocked duct as it was noted to have some debris in it. Has this improved with the warm compresses?

## 2019-01-25 NOTE — Telephone Encounter (Signed)
Will send in augmentin for possible infection with blocked breast duct to see if symptoms improve. Continue warm compresses as well.

## 2019-01-25 NOTE — Telephone Encounter (Signed)
Patient has been advised. KW 

## 2019-01-26 ENCOUNTER — Ambulatory Visit (INDEPENDENT_AMBULATORY_CARE_PROVIDER_SITE_OTHER): Payer: BC Managed Care – PPO | Admitting: Podiatry

## 2019-01-26 ENCOUNTER — Encounter: Payer: Self-pay | Admitting: Podiatry

## 2019-01-26 ENCOUNTER — Other Ambulatory Visit: Payer: Self-pay

## 2019-01-26 DIAGNOSIS — L600 Ingrowing nail: Secondary | ICD-10-CM | POA: Diagnosis not present

## 2019-01-26 MED ORDER — GENTAMICIN SULFATE 0.1 % EX CREA: 1.0000 "application " | TOPICAL_CREAM | Freq: Two times a day (BID) | CUTANEOUS | 1 refills | Status: DC

## 2019-01-26 NOTE — Patient Instructions (Signed)

## 2019-01-27 ENCOUNTER — Other Ambulatory Visit: Payer: Self-pay | Admitting: Physician Assistant

## 2019-01-27 DIAGNOSIS — I10 Essential (primary) hypertension: Secondary | ICD-10-CM

## 2019-01-29 NOTE — Progress Notes (Signed)
   Subjective: Patient presents today for evaluation of intermittent pain to the medial border of the left great toe that began one month ago. Patient is concerned for possible ingrown nail. She denies any pain at this time but states sometimes applying pressure to the toe causes symptoms. She has not had any treatment for the complaint. Patient presents today for further treatment and evaluation.  No past medical history on file.  Objective:  General: Well developed, nourished, in no acute distress, alert and oriented x3   Dermatology: Skin is warm, dry and supple bilateral. Medial border left hallux appears to be erythematous with evidence of an ingrowing nail. Pain on palpation noted to the border of the nail fold. The remaining nails appear unremarkable at this time. There are no open sores, lesions.  Vascular: Dorsalis Pedis artery and Posterior Tibial artery pedal pulses palpable. No lower extremity edema noted.   Neruologic: Grossly intact via light touch bilateral.  Musculoskeletal: Muscular strength within normal limits in all groups bilateral. Normal range of motion noted to all pedal and ankle joints.   Assesement: #1 Paronychia with ingrowing nail medial border left hallux #2 Pain in toe #3 Incurvated nail  Plan of Care:  1. Patient evaluated.  2. Discussed treatment alternatives and plan of care. Explained nail avulsion procedure and post procedure course to patient. 3. Patient opted for permanent partial nail avulsion of the medial border of the left hallux.  4. Prior to procedure, local anesthesia infiltration utilized using 3 ml of a 50:50 mixture of 2% plain lidocaine and 0.5% plain marcaine in a normal hallux block fashion and a betadine prep performed.  5. Partial permanent nail avulsion with chemical matrixectomy performed using [id number] applications of phenol followed by alcohol flush.  6. Light dressing applied. 7. Prescription for Gentamicin cream provided to  patient to use daily with a bandage.  8. Return to clinic in 2 weeks. Edrick Kins, DPM [facility]  Dr. Edrick Kins, [address]                                        [place], [place] [address]                Office [phone]  Fax [phone]

## 2019-02-04 NOTE — Progress Notes (Signed)
Patient: Madison Ritter Female    DOB: October 14, 1960   58 y.o.   MRN: [id number] Visit Date: 02/05/2019  Today's Provider: Margaretann Loveless, PA-C   Chief Complaint  Patient presents with  . Follow-up  . Hypertension   Subjective:    I,Joseline E. Rosas,RMA am acting as a Neurosurgeon for [employer], PA-C.  HPI   Hypertension, Follow up:  The patient was last seen for  4 weeks ago. Changes made since that visit include Continue amlodipine-valsartan-hctz 10-160-25mg . Add metoprolol XR 50mg  daily Blood Pressures at home have been 130'-140's/90's.  She reports excellent compliance with treatment. She is not having side effects. She feels like she is gaining weight and is not sure if is coming from the Metoprolol. She reports that she has not changed her diet but has been less active in the past 3 weeks.  BP Readings from Last 3 Encounters:  02/05/19 140/90  01/07/19 (!) 153/109  12/05/17 (!) 190/140    Wt Readings from Last 3 Encounters:  02/05/19 194 lb 3.2 oz (88.1 kg)  01/07/19 191 lb 9.6 oz (86.9 kg)  12/05/17 197 lb 12.8 oz (89.7 kg)   ------------------------------------------------------------------------    Allergies  Allergen Reactions  . Sulfa Antibiotics      Current Outpatient Medications:  .  amLODIPine-Valsartan-HCTZ 10-160-25 MG TABS, Take 1 tablet by mouth once daily, Disp: 90 tablet, Rfl: 1 .  amoxicillin-clavulanate (AUGMENTIN) 875-125 MG tablet, Take 1 tablet by mouth 2 (two) times daily., Disp: 20 tablet, Rfl: 0 .  gentamicin cream (GARAMYCIN) 0.1 %, Apply 1 application topically 2 (two) times daily., Disp: 15 g, Rfl: 1 .  metoprolol succinate (TOPROL-XL) 50 MG 24 hr tablet, Take 1 tablet (50 mg total) by mouth daily. Take with or immediately following a meal., Disp: 90 tablet, Rfl: 3  Review of Systems  Constitutional: Negative.   Respiratory: Negative.   Cardiovascular: Negative.   Gastrointestinal: Negative.   Neurological:  Negative.     Social History   Tobacco Use  . Smoking status: Current Every Day Smoker    Packs/day: 1.00    Years: 10.00    Pack years: 10.00    Types: Cigars  . Smokeless tobacco: Never Used  . Tobacco comment: 1 cigar daily  Substance Use Topics  . Alcohol use: Yes    Comment: occasional      Objective:   BP 140/90 (BP Location: Left Arm, Patient Position: Sitting, Cuff Size: Large) Comment: manually  Pulse 66   Temp (!) 97.3 F (36.3 C) (Temporal)   Resp 16   Wt 194 lb 3.2 oz (88.1 kg)   BMI 33.33 kg/m  Vitals:   02/05/19 1111  BP: 140/90  Pulse: 66  Resp: 16  Temp: (!) 97.3 F (36.3 C)  TempSrc: Temporal  Weight: 194 lb 3.2 oz (88.1 kg)  Body mass index is 33.33 kg/m.   Physical Exam Vitals signs reviewed.  Constitutional:      General: She is not in acute distress.    Appearance: Normal appearance. She is well-developed. She is obese. She is not ill-appearing or diaphoretic.  HENT:     Head: Normocephalic and atraumatic.  Neck:     Musculoskeletal: Normal range of motion and neck supple.  Cardiovascular:     Rate and Rhythm: Normal rate and regular rhythm.     Heart sounds: Normal heart sounds. No murmur. No friction rub. No gallop.   Pulmonary:     Effort:  Pulmonary effort is normal. No respiratory distress.     Breath sounds: Normal breath sounds. No wheezing or rales.  Neurological:     Mental Status: She is alert.      No results found for any visits on 02/05/19.     Assessment & Plan    1. Essential hypertension Much improved. Continue amlodipine-valsartan-hctz 10-160-25mg  and metoprolol xl 50mg  daily. Return in 3 months for A1c f/u and recheck of BP.   2. Hyperplasia of lactiferous duct of left breast Has had mammogram. Failed conservative management of warm compresses and antibiotics. Referral to gen surg placed below.  - Ambulatory referral to General Surgery  3. Leukocytosis, unspecified type Noted on labs previously. Possibly  from duct. Will check labs as below and f/u pending results. - CBC with Differential     Mar Daring, PA-C  [facility]

## 2019-02-05 ENCOUNTER — Encounter: Payer: Self-pay | Admitting: Physician Assistant

## 2019-02-05 ENCOUNTER — Ambulatory Visit (INDEPENDENT_AMBULATORY_CARE_PROVIDER_SITE_OTHER): Payer: BC Managed Care – PPO | Admitting: Physician Assistant

## 2019-02-05 ENCOUNTER — Other Ambulatory Visit: Payer: Self-pay

## 2019-02-05 VITALS — BP 140/90 | HR 66 | Temp 97.3°F | Resp 16 | Wt 194.2 lb

## 2019-02-05 DIAGNOSIS — D72829 Elevated white blood cell count, unspecified: Secondary | ICD-10-CM | POA: Diagnosis not present

## 2019-02-05 DIAGNOSIS — I10 Essential (primary) hypertension: Secondary | ICD-10-CM | POA: Diagnosis not present

## 2019-02-05 DIAGNOSIS — N6092 Unspecified benign mammary dysplasia of left breast: Secondary | ICD-10-CM

## 2019-02-06 LAB — CBC WITH DIFFERENTIAL/PLATELET
Basophils Absolute: 0.1 10*3/uL (ref 0.0–0.2)
Basos: 1 %
EOS (ABSOLUTE): 0.4 10*3/uL (ref 0.0–0.4)
Eos: 4 %
Hematocrit: 41.4 % (ref 34.0–46.6)
Hemoglobin: 13.7 g/dL (ref 11.1–15.9)
Immature Grans (Abs): 0 10*3/uL (ref 0.0–0.1)
Immature Granulocytes: 0 %
Lymphocytes Absolute: 4.1 10*3/uL — ABNORMAL HIGH (ref 0.7–3.1)
Lymphs: 37 %
MCH: 30.2 pg (ref 26.6–33.0)
MCHC: 33.1 g/dL (ref 31.5–35.7)
MCV: 91 fL (ref 79–97)
Monocytes Absolute: 0.8 10*3/uL (ref 0.1–0.9)
Monocytes: 8 %
Neutrophils Absolute: 5.5 10*3/uL (ref 1.4–7.0)
Neutrophils: 50 %
Platelets: 329 10*3/uL (ref 150–450)
RBC: 4.54 x10E6/uL (ref 3.77–5.28)
RDW: 12.5 % (ref 11.7–15.4)
WBC: 10.9 10*3/uL — ABNORMAL HIGH (ref 3.4–10.8)

## 2019-02-08 ENCOUNTER — Telehealth: Payer: Self-pay

## 2019-02-08 NOTE — Telephone Encounter (Signed)
-----   Message from Mar Daring, PA-C sent at 02/08/2019  8:24 AM EST ----- WBC count is decreasing as expected

## 2019-02-08 NOTE — Telephone Encounter (Signed)
Pt advised.   Thanks,   -Abigayle Wilinski  

## 2019-02-16 ENCOUNTER — Other Ambulatory Visit: Payer: Self-pay

## 2019-02-16 ENCOUNTER — Encounter: Payer: Self-pay | Admitting: Surgery

## 2019-02-16 ENCOUNTER — Ambulatory Visit (INDEPENDENT_AMBULATORY_CARE_PROVIDER_SITE_OTHER): Payer: BC Managed Care – PPO | Admitting: Surgery

## 2019-02-16 VITALS — BP 140/90 | HR 75 | Temp 97.9°F | Ht 64.0 in | Wt 192.0 lb

## 2019-02-16 DIAGNOSIS — Q839 Congenital malformation of breast, unspecified: Secondary | ICD-10-CM

## 2019-02-16 DIAGNOSIS — N6002 Solitary cyst of left breast: Secondary | ICD-10-CM | POA: Diagnosis not present

## 2019-02-16 NOTE — Progress Notes (Signed)
Patient ID: Madison Ritter, female   DOB: 1960/10/03, 58 y.o.   MRN: [id number]  Chief Complaint: Left nipple cyst  History of Present Illness Madison Ritter is a 58 y.o. female with with a history of an abnormally large left nipple, work-up has revealed a 1 cm cyst within the nipple.  She denies any particular nipple discharge, reports it spontaneously resolved about a year ago, and has gradually recurred.  She denies any particular history of inflammation, itching, pain or discharge.  She has a history of leukocytosis and a trial of Augmentin saw her white count diminish.  She nursed very briefly and underwent some home remedies for nipple obstruction which improved however she did not resume nursing.  She denies any history of breast abscess or mastitis.  She denies any prior breast biopsies.  She took birth control for 10 years.  She has 2 aunts with a history of breast cancer.  She has been pregnant 3 times.  Past Medical History Past Medical History:  Diagnosis Date  . Hypertension       Past Surgical History:  Procedure Laterality Date  . ABDOMINAL HYSTERECTOMY  2004  . TUBAL LIGATION      Allergies  Allergen Reactions  . Sulfa Antibiotics     Current Outpatient Medications  Medication Sig Dispense Refill  . amLODIPine-Valsartan-HCTZ 10-160-25 MG TABS Take 1 tablet by mouth once daily 90 tablet 1  . gentamicin cream (GARAMYCIN) 0.1 % Apply 1 application topically 2 (two) times daily. 15 g 1  . metoprolol succinate (TOPROL-XL) 50 MG 24 hr tablet Take 1 tablet (50 mg total) by mouth daily. Take with or immediately following a meal. 90 tablet 3   No current facility-administered medications for this visit.     Family History Family History  Problem Relation Age of Onset  . Diabetes Mother   . Hypertension Mother   . Congestive Heart Failure Mother   . Sleep apnea Mother   . Stroke Mother   . Hypertension Father   . Head & neck cancer Father   . Thyroid disease Sister   .  Hypertension Brother   . Stroke Brother   . Hypertension Brother   . Breast cancer Maternal Aunt 58  . Breast cancer Other       Social History Social History   Tobacco Use  . Smoking status: Current Every Day Smoker    Packs/day: 0.10    Years: 10.00    Pack years: 1.00    Types: Cigars  . Smokeless tobacco: Never Used  . Tobacco comment: 1 cigar daily  Substance Use Topics  . Alcohol use: Yes    Comment: occasional  . Drug use: No        Review of Systems  Constitutional: Positive for diaphoresis.  HENT: Negative.   Eyes: Negative.   Respiratory: Negative.   Cardiovascular: Negative.   Gastrointestinal: Negative.   Genitourinary: Negative.   Musculoskeletal: Negative.   Skin: Negative.   Endo/Heme/Allergies: Negative.       Physical Exam Blood pressure 140/90, pulse 75, temperature 97.9 F (36.6 C), height 5\' 4"  (1.626 m), weight 192 lb (87.1 kg), SpO2 98 %. Last Weight  Most recent update: 02/16/2019  9:39 AM   Weight  87.1 kg (192 lb)            CONSTITUTIONAL: Well developed, and nourished, appropriately responsive and aware without distress.   EYES: Sclera non-icteric.   EARS, NOSE, MOUTH AND THROAT: Mask worn.  The oropharynx  is clear.  Oral mucosa is pink and moist. Hearing is intact to voice.  NECK: Trachea is midline, and there is no jugular venous distension.  LYMPH NODES:  Lymph nodes in the neck are not enlarged. RESPIRATORY:  Lungs are clear, and breath sounds are equal bilaterally. Normal respiratory effort without pathologic use of accessory muscles. CARDIOVASCULAR: Heart is regular in rate and rhythm. GI: The abdomen is  soft, nontender, and nondistended.  GU: PA student Bethany in room as chaperone: Bilateral breasts are symmetric, pendulous.  The left nipple skin is slightly thinned and shiny, there is a palpable lesion on the inferior medial aspect.  It is not firm, hard or rubbery.  There are no appreciable punctum overlying it, and  definitely no nipple discharge on palpation.  There are no other dominant or more suspicious masses in either breast. MUSCULOSKELETAL:  Symmetrical muscle tone appreciated in all four extremities.    SKIN: Skin turgor is normal. No pathologic skin lesions appreciated.  NEUROLOGIC:  Motor and sensation appear grossly normal.  Cranial nerves are grossly without defect. PSYCH:  Alert and oriented to person, place and time. Affect is appropriate for situation.  Data Reviewed I have personally reviewed what is currently available of the patient's imaging, recent labs and medical records.   I reviewed her ultrasound imaging of the cystic lesion in the left nipple.  It appears consistent with benign etiology. Assessment    Cystic lesion left nipple. Patient Active Problem List   Diagnosis Date Noted  . Hematuria 08/18/2015  . Adult BMI 30+ 01/11/2015  . Diabetes mellitus type 2, uncomplicated ([place]) 08/67/6195  . Hypercholesteremia 01/11/2015  . Vitamin D deficiency 01/11/2015  . Carpal tunnel syndrome 04/22/2009  . Current tobacco use 02/13/2009  . Essential (primary) hypertension 03/18/1998    Plan    We discussed the risks of nipple surgery including a ductal fistula, scarring, recurrence, infection.  We discussed a major duct excision with dissection from the overlying nipple skin and uncertain as to what cosmetic outcome to anticipate.  This is a concern to her and she is somewhat indecisive in terms of if this is worthy of the risk.  I have enable her to have the freedom to consider pursuing excision or deferring it for now.  I will be glad to see her back as she makes her decision.  Face-to-face time spent with the patient and accompanying care providers(if present) was 45 minutes, with more than 50% of the time spent counseling, educating, and coordinating care of the patient.      Ronny Bacon 02/16/2019, 4:22 PM

## 2019-02-16 NOTE — Patient Instructions (Signed)
Patient will contact our office if she decides to move forward with surgery.  Patient will follow up with Dr.Rodenberg in two weeks.   Atypical Hyperplasia of the Breast Atypical hyperplasia of the breast is a condition in which some cells in the breast are unusual or abnormal. The cells can be found in the ducts or lobules of the breast. Compared to normal breast cells, the cells seen in atypical hyperplasia:  Grow faster than normal breast cells.  Are organized in an unusual way.  Grow in larger numbers than normal breast cells (overproduction).  Are unusual in size and shape. Atypical hyperplasia is not a form of breast cancer. However, it may:  Make you more likely to develop breast cancer.  Turn into cancer (pre-cancerous), if it is not treated. What are the causes? The cause of this condition is not known. What increases the risk? The following factors may make you more likely to develop this condition:  Age. Your risk increases as you get older.  Certain inherited genes.  Family or personal history of breast cancer.  Having prior radiation treatments to your breasts or chest area.  Obesity.  Using or having used hormones, such as estrogen.  Starting menstruation before the age of 19.  Going through menopause after age 75.  Drinking more than one alcoholic beverage a day.  Never having given birth.  Giving birth to your first child when you were over the age of 34.  Not breastfeeding, if you have given birth. What are the signs or symptoms? There are no symptoms of this condition. How is this diagnosed?  This condition is usually discovered during a routine X-ray study of the breasts (mammogram). If something concerning is found on a mammogram, a biopsy is performed. This means that a small sample of breast tissue is removed and sent to a lab. In most cases, breast cells can be removed through a needle. Sometimes, a small cut (incision) will need to be made in  the breast to remove a sample of breast tissue. Atypical hyperplasia can then be diagnosed by examining the breast cells under a microscope. How is this treated? This condition may be treated with:  More frequent monitoring of breast health with: ? Clinical breast exams. ? Mammograms. ? Ultrasounds. ? MRIs.  Medicines to keep the abnormal cells from spreading and becoming cancerous (chemoprevention).  A lumpectomy. This is the removal of the area of abnormal cells, along with some of the normal tissue in the area. This may also be called breast-conserving surgery.  A simple mastectomy. This is the removal of breast tissue, the nipple, and the circle of colored tissue around the nipple (areola). Sometimes, one or more lymph nodes from under the arm are also removed.  A preventive mastectomy. This is the removal of both breasts. This is usually only done if you have a very high risk of developing breast cancer. Follow these instructions at home: Breast health  Examine your breasts after every menstrual period. If you do not have menstrual periods, check your breasts on the first day of every month. Feel for changes in your breasts, such as: ? More tenderness. ? A new growth. ? A change in size or shape. ? A change in an existing lump. Alcohol use  Do not drink alcohol if: ? Your health care provider tells you not to drink. ? You are pregnant, may be pregnant, or are planning to become pregnant.  If you drink alcohol, limit how much you have  to 0-1 drink a day.  Be aware of how much alcohol is in your drink. In the [place] one drink equals one typical bottle of beer (12 oz), one-half glass of wine (5 oz), or one shot of hard liquor (1 oz). General instructions  Take over-the-counter and prescription medicines only as told by your health care provider.  Eat a healthy diet. This includes low-fat dairy products, lean meats, and fiber-rich foods, such as fruits, vegetables, and whole  grains. To get more fiber: ? Make sure half your plate is filled with fruits or vegetables. ? Choose high-fiber foods such as whole-grain breads and cereals.  Do not use any products that contain nicotine or tobacco, such as cigarettes and e-cigarettes. If you need help quitting, ask your health care provider.  Keep all follow-up visits as told by your health care provider. This is important. Contact a health care provider if you have:  A fever.  A change in shape or size of your breasts or nipples.  A change in the skin of your breast or nipples, such as a reddened or scaly area.  Unusual discharge from your nipples.  A lump or thick area that was not there before.  Pain in your breasts.  Anything that concerns you. Get help right away if you have:  Trouble breathing.  Chest pain.  Redness of your breast and the redness is spreading. Summary  Atypical hyperplasia of the breast is a condition in which some cells in the breast are unusual or abnormal.  Atypical hyperplasia is not a form of breast cancer. However, it may put you at greater risk for developing breast cancer.  This condition is usually discovered during a routine X-ray study of the breasts (mammogram).  Examine your breasts after every menstrual period. If you do not have menstrual periods, check your breasts on the first day of every month. Let your health care provider know if you notice any changes in your breasts. This information is not intended to replace advice given to you by your health care provider. Make sure you discuss any questions you have with your health care provider. Document Released: 09/29/2013 Document Revised: 04/04/2017 Document Reviewed: 04/03/2017 Elsevier Patient Education  2020 [employer].

## 2019-02-19 ENCOUNTER — Other Ambulatory Visit: Payer: Self-pay

## 2019-02-19 ENCOUNTER — Ambulatory Visit: Payer: BC Managed Care – PPO | Admitting: Podiatry

## 2019-03-04 ENCOUNTER — Ambulatory Visit: Payer: Self-pay | Admitting: Surgery

## 2019-03-19 HISTORY — PX: BREAST EXCISIONAL BIOPSY: SUR124

## 2019-03-25 ENCOUNTER — Ambulatory Visit (INDEPENDENT_AMBULATORY_CARE_PROVIDER_SITE_OTHER): Payer: BC Managed Care – PPO | Admitting: Surgery

## 2019-03-25 ENCOUNTER — Encounter: Payer: Self-pay | Admitting: Surgery

## 2019-03-25 ENCOUNTER — Ambulatory Visit: Payer: Self-pay | Admitting: Surgery

## 2019-03-25 ENCOUNTER — Other Ambulatory Visit: Payer: Self-pay

## 2019-03-25 VITALS — BP 151/96 | HR 71 | Temp 97.7°F | Ht 64.0 in | Wt 191.0 lb

## 2019-03-25 DIAGNOSIS — N6002 Solitary cyst of left breast: Secondary | ICD-10-CM | POA: Diagnosis not present

## 2019-03-25 NOTE — Patient Instructions (Signed)
You are scheduled for surgery at [facility] on 03/31/19 with Dr Claudine Mouton.  Please refer to your blue surgery sheet for instructions.  Pre admit testing will be calling you before surgery to go over what you may and may not do prior to surgery and any medication adjustments.   Call us if you have any questions.

## 2019-03-25 NOTE — Progress Notes (Signed)
Patient ID: Madison Ritter, female   DOB: 09-26-1960, 59 y.o.   MRN: [id number]  Chief Complaint: Left breast nipple cyst  History of Present Illness Madison Ritter is a 59 y.o. female with the above, previously evaluated, she is now elected to proceed with surgery.  She continues to have concerns/worries every time she sees the area.  Though not suspicious for malignancy, she would like to improve her risks and peace of mind by not having the lesion present.  We reviewed the details of the anticipated procedure, and she accepts the risks of cosmetic changes, anesthesia, bleeding and infection.  Past Medical History Past Medical History:  Diagnosis Date  . Hypertension       Past Surgical History:  Procedure Laterality Date  . ABDOMINAL HYSTERECTOMY  2004  . TUBAL LIGATION      Allergies  Allergen Reactions  . Sulfa Antibiotics     Current Outpatient Medications  Medication Sig Dispense Refill  . amLODIPine-Valsartan-HCTZ 10-160-25 MG TABS Take 1 tablet by mouth once daily 90 tablet 1  . gentamicin cream (GARAMYCIN) 0.1 % Apply 1 application topically 2 (two) times daily. 15 g 1  . metoprolol succinate (TOPROL-XL) 50 MG 24 hr tablet Take 1 tablet (50 mg total) by mouth daily. Take with or immediately following a meal. 90 tablet 3   No current facility-administered medications for this visit.    Family History Family History  Problem Relation Age of Onset  . Diabetes Mother   . Hypertension Mother   . Congestive Heart Failure Mother   . Sleep apnea Mother   . Stroke Mother   . Hypertension Father   . Head & neck cancer Father   . Thyroid disease Sister   . Hypertension Brother   . Stroke Brother   . Hypertension Brother   . Breast cancer Maternal Aunt 104  . Breast cancer Other       Social History Social History   Tobacco Use  . Smoking status: Current Every Day Smoker    Packs/day: 0.10    Years: 10.00    Pack years: 1.00    Types: Cigars  . Smokeless tobacco:  Never Used  . Tobacco comment: 1 cigar daily  Substance Use Topics  . Alcohol use: Yes    Comment: occasional  . Drug use: No        ROS Review of Systems  Constitutional: Positive for diaphoresis.  HENT: Negative.   Eyes: Negative.   Respiratory: Negative.   Cardiovascular: Negative.   Gastrointestinal: Negative.   Genitourinary: Negative.   Musculoskeletal: Negative.   Skin: Negative.   Endo/Heme/Allergies: Negative.    Physical Exam Blood pressure (!) 151/96, pulse 71, temperature 97.7 F (36.5 C), height 5\' 4"  (1.626 m), weight 191 lb (86.6 kg), SpO2 97 %. Last Weight  Most recent update: 03/25/2019  1:58 PM   Weight  86.6 kg (191 lb)            CONSTITUTIONAL: Well developed, and nourished, appropriately responsive and aware without distress.   EYES: Sclera non-icteric.   EARS, NOSE, MOUTH AND THROAT: Mask worn.  Hearing is intact to voice.  NECK: Trachea is midline, and there is no jugular venous distension.  LYMPH NODES:  Lymph nodes in the neck are not enlarged. RESPIRATORY:  Lungs are clear, and breath sounds are equal bilaterally. Normal respiratory effort without pathologic use of accessory muscles. CARDIOVASCULAR: Heart is regular in rate and rhythm. GI: The abdomen is  soft, nontender, and nondistended.  There were no palpable masses.  GU: CMA, Caryl-Lyn in room as chaperone: Bilateral breasts are symmetric, pendulous.  The left nipple skin is somewhat thinned and shiny, there is a palpable lesion on the inferior medial aspect.  It is not firm, hard or rubbery.  There are no appreciable punctum overlying it, and definitely no nipple discharge on palpation.  There are no other dominant or more suspicious masses in either breast. MUSCULOSKELETAL:  Symmetrical muscle tone appreciated in all four extremities.    SKIN: Skin turgor is normal. No pathologic skin lesions appreciated.  NEUROLOGIC:  Motor and sensation appear grossly normal.  Cranial nerves are grossly  without defect. PSYCH:  Alert and oriented to person, place and time. Affect is appropriate for situation.  Data Reviewed I have personally reviewed what is currently available of the patient's imaging, recent labs and medical records.     Assessment    Left nipple duct cyst. Patient Active Problem List   Diagnosis Date Noted  . Hematuria 08/18/2015  . Adult BMI 30+ 01/11/2015  . Diabetes mellitus type 2, uncomplicated ([facility]) 01/11/2015  . Hypercholesteremia 01/11/2015  . Vitamin D deficiency 01/11/2015  . Carpal tunnel syndrome 04/22/2009  . Current tobacco use 02/13/2009  . Essential (primary) hypertension 03/18/1998    Plan    Major duct excision, left breast. Risks and benefits of the procedure were once again reviewed, risks of cosmetic appearance not meeting expectations, infection, recurrence, anesthesia and bleeding all discussed.  Questions answered, no guarantees were ever expressed or implied.  Face-to-face time spent with the patient and accompanying care providers(if present) was 20 minutes, with more than 50% of the time spent counseling, educating, and coordinating care of the patient.      Kali Ambler M.D., FACS 03/25/2019, 2:17 PM    

## 2019-03-25 NOTE — H&P (View-Only) (Signed)
Patient ID: Madison Ritter, female   DOB: 09-26-1960, 59 y.o.   MRN: [id number]  Chief Complaint: Left breast nipple cyst  History of Present Illness Madison Ritter is a 59 y.o. female with the above, previously evaluated, she is now elected to proceed with surgery.  She continues to have concerns/worries every time she sees the area.  Though not suspicious for malignancy, she would like to improve her risks and peace of mind by not having the lesion present.  We reviewed the details of the anticipated procedure, and she accepts the risks of cosmetic changes, anesthesia, bleeding and infection.  Past Medical History Past Medical History:  Diagnosis Date  . Hypertension       Past Surgical History:  Procedure Laterality Date  . ABDOMINAL HYSTERECTOMY  2004  . TUBAL LIGATION      Allergies  Allergen Reactions  . Sulfa Antibiotics     Current Outpatient Medications  Medication Sig Dispense Refill  . amLODIPine-Valsartan-HCTZ 10-160-25 MG TABS Take 1 tablet by mouth once daily 90 tablet 1  . gentamicin cream (GARAMYCIN) 0.1 % Apply 1 application topically 2 (two) times daily. 15 g 1  . metoprolol succinate (TOPROL-XL) 50 MG 24 hr tablet Take 1 tablet (50 mg total) by mouth daily. Take with or immediately following a meal. 90 tablet 3   No current facility-administered medications for this visit.    Family History Family History  Problem Relation Age of Onset  . Diabetes Mother   . Hypertension Mother   . Congestive Heart Failure Mother   . Sleep apnea Mother   . Stroke Mother   . Hypertension Father   . Head & neck cancer Father   . Thyroid disease Sister   . Hypertension Brother   . Stroke Brother   . Hypertension Brother   . Breast cancer Maternal Aunt 104  . Breast cancer Other       Social History Social History   Tobacco Use  . Smoking status: Current Every Day Smoker    Packs/day: 0.10    Years: 10.00    Pack years: 1.00    Types: Cigars  . Smokeless tobacco:  Never Used  . Tobacco comment: 1 cigar daily  Substance Use Topics  . Alcohol use: Yes    Comment: occasional  . Drug use: No        ROS Review of Systems  Constitutional: Positive for diaphoresis.  HENT: Negative.   Eyes: Negative.   Respiratory: Negative.   Cardiovascular: Negative.   Gastrointestinal: Negative.   Genitourinary: Negative.   Musculoskeletal: Negative.   Skin: Negative.   Endo/Heme/Allergies: Negative.    Physical Exam Blood pressure (!) 151/96, pulse 71, temperature 97.7 F (36.5 C), height 5\' 4"  (1.626 m), weight 191 lb (86.6 kg), SpO2 97 %. Last Weight  Most recent update: 03/25/2019  1:58 PM   Weight  86.6 kg (191 lb)            CONSTITUTIONAL: Well developed, and nourished, appropriately responsive and aware without distress.   EYES: Sclera non-icteric.   EARS, NOSE, MOUTH AND THROAT: Mask worn.  Hearing is intact to voice.  NECK: Trachea is midline, and there is no jugular venous distension.  LYMPH NODES:  Lymph nodes in the neck are not enlarged. RESPIRATORY:  Lungs are clear, and breath sounds are equal bilaterally. Normal respiratory effort without pathologic use of accessory muscles. CARDIOVASCULAR: Heart is regular in rate and rhythm. GI: The abdomen is  soft, nontender, and nondistended.  There were no palpable masses.  GU: CMA, Caryl-Lyn in room as chaperone: Bilateral breasts are symmetric, pendulous.  The left nipple skin is somewhat thinned and shiny, there is a palpable lesion on the inferior medial aspect.  It is not firm, hard or rubbery.  There are no appreciable punctum overlying it, and definitely no nipple discharge on palpation.  There are no other dominant or more suspicious masses in either breast. MUSCULOSKELETAL:  Symmetrical muscle tone appreciated in all four extremities.    SKIN: Skin turgor is normal. No pathologic skin lesions appreciated.  NEUROLOGIC:  Motor and sensation appear grossly normal.  Cranial nerves are grossly  without defect. PSYCH:  Alert and oriented to person, place and time. Affect is appropriate for situation.  Data Reviewed I have personally reviewed what is currently available of the patient's imaging, recent labs and medical records.     Assessment    Left nipple duct cyst. Patient Active Problem List   Diagnosis Date Noted  . Hematuria 08/18/2015  . Adult BMI 30+ 01/11/2015  . Diabetes mellitus type 2, uncomplicated ([facility]) 26/37/8588  . Hypercholesteremia 01/11/2015  . Vitamin D deficiency 01/11/2015  . Carpal tunnel syndrome 04/22/2009  . Current tobacco use 02/13/2009  . Essential (primary) hypertension 03/18/1998    Plan    Major duct excision, left breast. Risks and benefits of the procedure were once again reviewed, risks of cosmetic appearance not meeting expectations, infection, recurrence, anesthesia and bleeding all discussed.  Questions answered, no guarantees were ever expressed or implied.  Face-to-face time spent with the patient and accompanying care providers(if present) was 20 minutes, with more than 50% of the time spent counseling, educating, and coordinating care of the patient.      Ronny Bacon M.D., FACS 03/25/2019, 2:17 PM

## 2019-03-26 ENCOUNTER — Telehealth: Payer: Self-pay | Admitting: Surgery

## 2019-03-26 NOTE — Telephone Encounter (Signed)
Pt has been advised of pre admission date/time, Covid Testing date and Surgery date.  Surgery Date: 03/31/19  Preadmission Testing Date: 2 hrs prior on day of surgery (NPO midnight & bring all medications) Covid Testing Date: 03/29/19 - patient advised to [facility] to [facility] ([address] [place])  [employer] Video sent via [employer] [facility] and [employer].  Patient has been made aware to call [phone], between 1-3:00pm the day before surgery, to find out what time to arrive.

## 2019-03-29 ENCOUNTER — Telehealth: Payer: Self-pay

## 2019-03-29 ENCOUNTER — Other Ambulatory Visit: Admission: RE | Admit: 2019-03-29 | Payer: BC Managed Care – PPO | Source: Ambulatory Visit

## 2019-03-29 NOTE — Telephone Encounter (Signed)
Patient called and would like to cancel her surgery for now. She is not ready yet to have surgery. I did let her know that all surgeries were on hold starting the 25th and did not know when she would be able to reschedule due to Covid restrictions. She said that she would call us when she is ready to reschedule her surgery.   I have notified the OR about the cancellation and have placed the surgery in [facility]. Pre admit testing has been notified of cancellation also.

## 2019-03-31 ENCOUNTER — Encounter: Admission: RE | Payer: Self-pay | Source: Home / Self Care

## 2019-03-31 ENCOUNTER — Ambulatory Visit: Admission: RE | Admit: 2019-03-31 | Payer: BC Managed Care – PPO | Source: Home / Self Care | Admitting: Surgery

## 2019-03-31 SURGERY — EXCISION DUCTAL SYSTEM BREAST
Anesthesia: General | Laterality: Left

## 2019-04-06 ENCOUNTER — Telehealth: Payer: Self-pay | Admitting: Surgery

## 2019-04-06 NOTE — Telephone Encounter (Signed)
Pt has been advised of new pre admission date/time, Covid Testing date and Surgery date.  Surgery Date: 04/14/19 Preadmission Testing Date: 04/08/19 (am) Covid Testing Date: 04/12/19 - patient advised to go to [facility] ([address] [place])  [employer] Video sent via [employer] [facility] and [employer].  Patient has been made aware to call [phone], between 1-3:00pm the day before surgery, to find out what time to arrive.

## 2019-04-08 ENCOUNTER — Other Ambulatory Visit: Payer: Self-pay

## 2019-04-08 ENCOUNTER — Other Ambulatory Visit
Admission: RE | Admit: 2019-04-08 | Discharge: 2019-04-08 | Disposition: A | Discharge status: Home / Self Care | Payer: BC Managed Care – PPO | Source: Ambulatory Visit | Attending: Surgery | Admitting: Surgery

## 2019-04-08 NOTE — Patient Instructions (Addendum)
Your procedure is scheduled on: April 14, 2019 Wednesday Report to Day Surgery on the 2nd floor of [facility]. To find out your arrival time, please call [phone] between 1PM - 3PM on: April 13, 2019 Tuesday  REMEMBER: Instructions that are not followed completely may result in serious medical risk, up to and including death; or upon the discretion of your surgeon and anesthesiologist your surgery may need to be rescheduled.  Do not eat food after midnight the night before surgery.  No gum chewing, lozengers or hard candies.  You may however, drink CLEAR liquids up to 2 hours before you are scheduled to arrive for your surgery. Do not drink anything within 2 hours of the start of your surgery.  Clear liquids include: - water  - apple juice  -clear gatorade - black coffee or tea (Do NOT add milk or creamers to the coffee or tea) Do NOT drink anything that is not on this list.  Type 1 and Type 2 diabetics should only drink water.  No Alcohol for 24 hours before or after surgery.  No Smoking including e-cigarettes for 24 hours prior to surgery.  No chewable tobacco products for at least 6 hours prior to surgery.  No nicotine patches on the day of surgery.  On the morning of surgery brush your teeth with toothpaste and water, you may rinse your mouth with mouthwash if you wish. Do not swallow any toothpaste or mouthwash.  Notify your doctor if there is any change in your medical condition (cold, fever, infection).  Do not wear jewelry, make-up, hairpins, clips or nail polish.  Do not wear lotions, powders, or perfumes and deodorant   Do not shave 48 hours prior to surgery.   Contacts and dentures may not be worn into surgery.  Do not bring valuables to the hospital, including drivers license, insurance or credit cards.  [facility] is not responsible for any belongings or valuables.   TAKE THESE MEDICATIONS THE MORNING OF SURGERY: metoprolol  Use CHG Soap  as  directed on instruction sheet.  Follow recommendations from Cardiologist, Pulmonologist or PCP regarding stopping Aspirin, Coumadin, Plavix, Eliquis, Pradaxa, or Pletal.  Stop Anti-inflammatories (NSAIDS) such as Advil, Aleve, Ibuprofen, Motrin, Naproxen, Naprosyn and Aspirin based products such as Excedrin, Goodys Powder, BC Powder. (May take Tylenol or Acetaminophen if needed.)  Stop ANY OVER THE COUNTER supplements until after surgery. STOP BLACK COHOSH. (May continue Vitamin D, Vitamin B, and multivitamin.)  Wear comfortable clothing (specific to your surgery type) to the hospital.  Plan for stool softeners for home use.  If you are being discharged the day of surgery, you will not be allowed to drive home. You will need a responsible adult to drive you home and stay with you that night.   If you are taking public transportation, you will need to have a responsible adult with you. Please confirm with your physician that it is acceptable to use public transportation.   Please call [phone] if you have any questions about these instructions.

## 2019-04-12 ENCOUNTER — Other Ambulatory Visit: Payer: BC Managed Care – PPO

## 2019-04-12 ENCOUNTER — Ambulatory Visit: Payer: Self-pay | Admitting: Surgery

## 2019-04-12 ENCOUNTER — Other Ambulatory Visit: Payer: Self-pay | Admitting: Surgery

## 2019-04-12 ENCOUNTER — Other Ambulatory Visit: Payer: Self-pay

## 2019-04-12 ENCOUNTER — Ambulatory Visit
Admission: RE | Admit: 2019-04-12 | Discharge: 2019-04-12 | Disposition: A | Discharge status: Home / Self Care | Payer: BC Managed Care – PPO | Source: Ambulatory Visit | Attending: Surgery | Admitting: Surgery

## 2019-04-12 DIAGNOSIS — Z01818 Encounter for other preprocedural examination: Secondary | ICD-10-CM | POA: Diagnosis not present

## 2019-04-12 DIAGNOSIS — Z20822 Contact with and (suspected) exposure to covid-19: Secondary | ICD-10-CM | POA: Diagnosis not present

## 2019-04-12 DIAGNOSIS — I10 Essential (primary) hypertension: Secondary | ICD-10-CM | POA: Diagnosis not present

## 2019-04-12 DIAGNOSIS — Z01810 Encounter for preprocedural cardiovascular examination: Secondary | ICD-10-CM | POA: Diagnosis not present

## 2019-04-12 DIAGNOSIS — N6002 Solitary cyst of left breast: Secondary | ICD-10-CM | POA: Insufficient documentation

## 2019-04-12 DIAGNOSIS — R9431 Abnormal electrocardiogram [ECG] [EKG]: Secondary | ICD-10-CM | POA: Insufficient documentation

## 2019-04-12 LAB — BASIC METABOLIC PANEL
Anion gap: 10 (ref 5–15)
BUN: 17 mg/dL (ref 6–20)
CO2: 26 mmol/L (ref 22–32)
Calcium: 9 mg/dL (ref 8.9–10.3)
Chloride: 102 mmol/L (ref 98–111)
Creatinine, Ser: 0.76 mg/dL (ref 0.44–1.00)
GFR calc Af Amer: >60 mL/min (ref >60)
GFR calc non Af Amer: >60 mL/min (ref >60)
Glucose, Bld: 204 mg/dL — ABNORMAL HIGH (ref 70–99)
Potassium: 2.9 mmol/L — ABNORMAL LOW (ref 3.5–5.1)
Sodium: 138 mmol/L (ref 135–145)

## 2019-04-12 LAB — SARS CORONAVIRUS 2 (TAT 6-24 HRS): SARS Coronavirus 2: NEGATIVE

## 2019-04-12 MED ORDER — POTASSIUM CHLORIDE ER 10 MEQ PO TBCR: 10.0000 meq | EXTENDED_RELEASE_TABLET | Freq: Three times a day (TID) | ORAL | 0 refills | Status: DC

## 2019-04-12 NOTE — Pre-Procedure Instructions (Signed)
Pre-Admit Testing Provider Notification Note  Provider Notified: Dr. Claudine Mouton  Notification Mode:  1) Fax 2) Secure Chat  Reason: Abnormal Labs. K+ 2.9  Response:  1) Fax confirmation received.  2) Secure Chat acknowledged and addressed.  Additional Information: Noted on Pre-Admit Worksheet. Placed on Chart.  Signed: Alvester Morin, RN

## 2019-04-13 MED ORDER — CEFAZOLIN SODIUM-DEXTROSE 2-4 GM/100ML-% IV SOLN
2.0000 g | INTRAVENOUS | Status: AC
  Administered 2019-04-14: 2 g via INTRAVENOUS

## 2019-04-14 ENCOUNTER — Encounter
Admission: RE | Disposition: A | Discharge status: Home / Self Care | Payer: Self-pay | Source: Home / Self Care | Attending: Surgery

## 2019-04-14 ENCOUNTER — Ambulatory Visit
Admission: RE | Admit: 2019-04-14 | Discharge: 2019-04-14 | Disposition: A | Discharge status: Home / Self Care | Payer: BC Managed Care – PPO | Attending: Surgery | Admitting: Surgery

## 2019-04-14 ENCOUNTER — Ambulatory Visit: Payer: BC Managed Care – PPO | Admitting: Anesthesiology

## 2019-04-14 ENCOUNTER — Other Ambulatory Visit: Payer: Self-pay

## 2019-04-14 ENCOUNTER — Encounter: Payer: Self-pay | Admitting: Surgery

## 2019-04-14 DIAGNOSIS — Z79899 Other long term (current) drug therapy: Secondary | ICD-10-CM | POA: Diagnosis not present

## 2019-04-14 DIAGNOSIS — E119 Type 2 diabetes mellitus without complications: Secondary | ICD-10-CM | POA: Insufficient documentation

## 2019-04-14 DIAGNOSIS — F17290 Nicotine dependence, other tobacco product, uncomplicated: Secondary | ICD-10-CM | POA: Insufficient documentation

## 2019-04-14 DIAGNOSIS — L720 Epidermal cyst: Secondary | ICD-10-CM | POA: Diagnosis not present

## 2019-04-14 DIAGNOSIS — I10 Essential (primary) hypertension: Secondary | ICD-10-CM | POA: Insufficient documentation

## 2019-04-14 DIAGNOSIS — N6002 Solitary cyst of left breast: Secondary | ICD-10-CM

## 2019-04-14 HISTORY — PX: BREAST DUCTAL SYSTEM EXCISION: SHX5242

## 2019-04-14 LAB — POCT I-STAT, CHEM 8
BUN: 16 mg/dL (ref 6–20)
Calcium, Ion: 1.23 mmol/L (ref 1.15–1.40)
Chloride: 101 mmol/L (ref 98–111)
Creatinine, Ser: 0.7 mg/dL (ref 0.44–1.00)
Glucose, Bld: 150 mg/dL — ABNORMAL HIGH (ref 70–99)
HCT: 44 % (ref 36.0–46.0)
Hemoglobin: 15 g/dL (ref 12.0–15.0)
Potassium: 3.7 mmol/L (ref 3.5–5.1)
Sodium: 139 mmol/L (ref 135–145)
TCO2: 29 mmol/L (ref 22–32)

## 2019-04-14 SURGERY — EXCISION DUCTAL SYSTEM BREAST
Anesthesia: General | Site: Breast | Laterality: Left

## 2019-04-14 MED ORDER — ACETAMINOPHEN 500 MG PO TABS: 1000.0000 mg | ORAL_TABLET | ORAL | Status: AC

## 2019-04-14 MED ORDER — PROPOFOL 10 MG/ML IV BOLUS
INTRAVENOUS | Status: AC
  Filled 2019-04-14: qty 20

## 2019-04-14 MED ORDER — MIDAZOLAM HCL 2 MG/2ML IJ SOLN
INTRAMUSCULAR | Status: DC | PRN
  Administered 2019-04-14: 2 mg via INTRAVENOUS

## 2019-04-14 MED ORDER — ONDANSETRON HCL 4 MG/2ML IJ SOLN: 4.0000 mg | Freq: Once | INTRAMUSCULAR | Status: DC | PRN

## 2019-04-14 MED ORDER — METHYLENE BLUE 0.5 % INJ SOLN
INTRAVENOUS | Status: AC
  Filled 2019-04-14: qty 10

## 2019-04-14 MED ORDER — LIDOCAINE HCL (PF) 2 % IJ SOLN
INTRAMUSCULAR | Status: AC
  Filled 2019-04-14: qty 5

## 2019-04-14 MED ORDER — ONDANSETRON HCL 4 MG/2ML IJ SOLN
INTRAMUSCULAR | Status: DC | PRN
  Administered 2019-04-14: 4 mg via INTRAVENOUS

## 2019-04-14 MED ORDER — IBUPROFEN 200 MG PO TABS: 200.0000 mg | ORAL_TABLET | Freq: Four times a day (QID) | ORAL | 2 refills | Status: DC | PRN

## 2019-04-14 MED ORDER — BUPIVACAINE HCL (PF) 0.25 % IJ SOLN
INTRAMUSCULAR | Status: AC
  Filled 2019-04-14: qty 30

## 2019-04-14 MED ORDER — FENTANYL CITRATE (PF) 100 MCG/2ML IJ SOLN: 25.0000 ug | INTRAMUSCULAR | Status: DC | PRN

## 2019-04-14 MED ORDER — PROPOFOL 10 MG/ML IV BOLUS
INTRAVENOUS | Status: DC | PRN
  Administered 2019-04-14: 200 mg via INTRAVENOUS

## 2019-04-14 MED ORDER — CHLORHEXIDINE GLUCONATE CLOTH 2 % EX PADS: 6.0000 | MEDICATED_PAD | Freq: Once | CUTANEOUS | Status: DC

## 2019-04-14 MED ORDER — ACETAMINOPHEN 500 MG PO TABS
ORAL_TABLET | ORAL | Status: AC
  Administered 2019-04-14: 12:00:00 1000 mg via ORAL
  Filled 2019-04-14: qty 2

## 2019-04-14 MED ORDER — FENTANYL CITRATE (PF) 100 MCG/2ML IJ SOLN
INTRAMUSCULAR | Status: DC | PRN
  Administered 2019-04-14 (×4): 25 ug via INTRAVENOUS

## 2019-04-14 MED ORDER — OXYCODONE HCL 5 MG PO TABS
ORAL_TABLET | ORAL | Status: AC
  Filled 2019-04-14: qty 1

## 2019-04-14 MED ORDER — FAMOTIDINE 20 MG PO TABS: 20.0000 mg | ORAL_TABLET | Freq: Once | ORAL | Status: AC

## 2019-04-14 MED ORDER — CEFAZOLIN SODIUM-DEXTROSE 2-4 GM/100ML-% IV SOLN
INTRAVENOUS | Status: AC
  Filled 2019-04-14: qty 100

## 2019-04-14 MED ORDER — SUCCINYLCHOLINE CHLORIDE 20 MG/ML IJ SOLN
INTRAMUSCULAR | Status: AC
  Filled 2019-04-14: qty 1

## 2019-04-14 MED ORDER — MIDAZOLAM HCL 2 MG/2ML IJ SOLN
INTRAMUSCULAR | Status: AC
  Filled 2019-04-14: qty 2

## 2019-04-14 MED ORDER — EPINEPHRINE PF 1 MG/ML IJ SOLN
INTRAMUSCULAR | Status: AC
  Filled 2019-04-14: qty 1

## 2019-04-14 MED ORDER — LIDOCAINE HCL (CARDIAC) PF 100 MG/5ML IV SOSY
PREFILLED_SYRINGE | INTRAVENOUS | Status: DC | PRN
  Administered 2019-04-14: 40 mg via INTRAVENOUS

## 2019-04-14 MED ORDER — ONDANSETRON HCL 4 MG/2ML IJ SOLN
INTRAMUSCULAR | Status: AC
  Filled 2019-04-14: qty 2

## 2019-04-14 MED ORDER — DEXAMETHASONE SODIUM PHOSPHATE 10 MG/ML IJ SOLN
INTRAMUSCULAR | Status: AC
  Filled 2019-04-14: qty 1

## 2019-04-14 MED ORDER — OXYCODONE HCL 5 MG PO TABS
5.0000 mg | ORAL_TABLET | Freq: Once | ORAL | Status: AC
  Administered 2019-04-14: 16:00:00 5 mg via ORAL

## 2019-04-14 MED ORDER — FENTANYL CITRATE (PF) 100 MCG/2ML IJ SOLN
INTRAMUSCULAR | Status: AC
  Filled 2019-04-14: qty 2

## 2019-04-14 MED ORDER — LACTATED RINGERS IV SOLN: INTRAVENOUS | Status: DC

## 2019-04-14 MED ORDER — BUPIVACAINE HCL 0.25 % IJ SOLN
INTRAMUSCULAR | Status: DC | PRN
  Administered 2019-04-14: 2 mL

## 2019-04-14 MED ORDER — DEXAMETHASONE SODIUM PHOSPHATE 10 MG/ML IJ SOLN
INTRAMUSCULAR | Status: DC | PRN
  Administered 2019-04-14: 5 mg via INTRAVENOUS

## 2019-04-14 MED ORDER — FAMOTIDINE 20 MG PO TABS
ORAL_TABLET | ORAL | Status: AC
  Administered 2019-04-14: 12:00:00 20 mg via ORAL
  Filled 2019-04-14: qty 1

## 2019-04-14 SURGICAL SUPPLY — 45 items
BLADE SURG 15 STRL LF DISP TIS (BLADE) ×2 IMPLANT
BLADE SURG 15 STRL SS (BLADE) ×2
BULB RESERV EVAC DRAIN JP 100C (MISCELLANEOUS) IMPLANT
CANISTER SUCT 1200ML W/VALVE (MISCELLANEOUS) ×2 IMPLANT
CHLORAPREP W/TINT 26 (MISCELLANEOUS) ×2 IMPLANT
CLIP VESOCCLUDE SM WIDE 6/CT (CLIP) ×2 IMPLANT
CNTNR SPEC 2.5X3XGRAD LEK (MISCELLANEOUS) ×1
CONT SPEC 4OZ STER OR WHT (MISCELLANEOUS) ×1
CONTAINER SPEC 2.5X3XGRAD LEK (MISCELLANEOUS) ×1 IMPLANT
COVER WAND RF STERILE (DRAPES) ×2 IMPLANT
DECANTER SPIKE VIAL GLASS SM (MISCELLANEOUS) ×2 IMPLANT
DERMABOND ADVANCED (GAUZE/BANDAGES/DRESSINGS) ×1
DERMABOND ADVANCED .7 DNX12 (GAUZE/BANDAGES/DRESSINGS) ×1 IMPLANT
DEVICE DUBIN SPECIMEN MAMMOGRA (MISCELLANEOUS) ×2 IMPLANT
DRAIN CHANNEL JP 15F RND 16 (MISCELLANEOUS) IMPLANT
DRAPE LAPAROTOMY TRNSV 106X77 (MISCELLANEOUS) ×2 IMPLANT
ELECT CAUTERY BLADE TIP 2.5 (TIP) ×2
ELECT REM PT RETURN 9FT ADLT (ELECTROSURGICAL) ×2
ELECTRODE CAUTERY BLDE TIP 2.5 (TIP) ×1 IMPLANT
ELECTRODE REM PT RTRN 9FT ADLT (ELECTROSURGICAL) ×1 IMPLANT
GAUZE XEROFORM 1X8 LF (GAUZE/BANDAGES/DRESSINGS) ×2 IMPLANT
GAUZE XEROFORM 4X4 STRL (GAUZE/BANDAGES/DRESSINGS) ×2 IMPLANT
GLOVE ORTHO TXT STRL SZ7.5 (GLOVE) ×6 IMPLANT
GOWN STRL REUS W/ TWL LRG LVL3 (GOWN DISPOSABLE) ×2 IMPLANT
GOWN STRL REUS W/TWL LRG LVL3 (GOWN DISPOSABLE) ×2
KIT MARKER MARGIN INK (KITS) IMPLANT
KIT TURNOVER KIT A (KITS) ×2 IMPLANT
NEEDLE FILTER BLUNT 18X 1/2SAF (NEEDLE) ×1
NEEDLE FILTER BLUNT 18X1 1/2 (NEEDLE) ×1 IMPLANT
NEEDLE HYPO 22GX1.5 SAFETY (NEEDLE) ×2 IMPLANT
PACK BASIN MINOR ARMC (MISCELLANEOUS) ×2 IMPLANT
SHEARS HARMONIC 9CM CVD (BLADE) IMPLANT
SLEVE PROBE SENORX GAMMA FIND (MISCELLANEOUS) ×2 IMPLANT
SPONGE KITTNER 5P (MISCELLANEOUS) IMPLANT
SUT MNCRL 4-0 (SUTURE) ×1
SUT MNCRL 4-0 27XMFL (SUTURE) ×1
SUT SILK 2 0 (SUTURE) ×1
SUT SILK 2-0 18XBRD TIE 12 (SUTURE) ×1 IMPLANT
SUT VIC AB 3-0 54X BRD REEL (SUTURE) ×1 IMPLANT
SUT VIC AB 3-0 BRD 54 (SUTURE) ×1
SUT VIC AB 3-0 SH 27 (SUTURE) ×1
SUT VIC AB 3-0 SH 27X BRD (SUTURE) ×1 IMPLANT
SUTURE MNCRL 4-0 27XMF (SUTURE) ×1 IMPLANT
SYR 10ML LL (SYRINGE) ×4 IMPLANT
WATER STERILE IRR 1000ML POUR (IV SOLUTION) ×2 IMPLANT

## 2019-04-14 NOTE — Op Note (Signed)
Excision of left nipple cyst.  Pre-operative Diagnosis: Left nipple cyst.   Post-operative Diagnosis: same, likely of dermal etiology.  Surgeon: Campbell Lerner, M.D., FACS  Anesthesia: General   Findings:  Dermal cyst excised from medial inferior aspect of left nipple.  No appreciable ductal communication.  Estimated Blood Loss: 5 mL          Specimens: Dermal cyst with overlying skin from medial left nipple.          Complications: none              Procedure Details  The patient was seen again in the Holding Room. The benefits, complications, treatment options, and expected outcomes were discussed with the patient. The risks of bleeding, infection, recurrence of symptoms, failure to resolve symptoms, unanticipated injury, prosthetic placement, prosthetic infection, any of which could require further surgery were reviewed with the patient. The likelihood of improving the patient's symptoms with return to their baseline status is good.  The patient and/or family concurred with the proposed plan, giving informed consent.  The patient was taken to Operating Room, identified and the procedure verified.    Prior to the induction of general anesthesia, antibiotic prophylaxis was administered. VTE prophylaxis was in place.  Anesthesia was then administered and tolerated well. After the induction, the patient was positioned in the supine position and the left breast was prepped with  Chloraprep and draped in the sterile fashion.   A Time Out was held and the above information confirmed. A radial incision was made along the medial aspect of the left nipple to excise the overlying dermis of the underlying lesion.  I was able to identify a well-defined plane and so the cyst was shelled out with sharp dissection from the adjacent nipple tissues and dermis in its entirety.  The contents appeared to be consistent with a sebaceous cyst.  The cyst will be sent for final pathology. Adequate hemostasis  was obtained with judicious electrocautery.  Local infiltration of quarter percent Marcaine plain was utilized.  The nipple was reconstructed with interrupted 4-0 Monocryl.  Dermabond was applied to the skin.  She appeared to tolerate the procedure well.   Campbell Lerner M.D., [facility] 04/14/2019 3:26 PM

## 2019-04-14 NOTE — Interval H&P Note (Signed)
History and Physical Interval Note:  04/14/2019 12:12 PM  Madison Ritter  has presented today for surgery, with the diagnosis of Left Nipple Cyst.  The various methods of treatment have been discussed with the patient and family. After consideration of risks, benefits and other options for treatment, the patient has consented to  Procedure(s): EXCISION DUCTAL SYSTEM BREAST - NO SENTINEL NODE (Left) as a surgical intervention.  The patient's history has been reviewed, patient examined, no change in status, stable for surgery.  I have reviewed the patient's chart and labs.  Questions were answered to the patient's satisfaction.    The pt's left breast has been marked.    Campbell Lerner

## 2019-04-14 NOTE — Anesthesia Preprocedure Evaluation (Signed)
Anesthesia Evaluation  Patient identified by MRN, date of birth, ID band Patient awake    Reviewed: Allergy & Precautions, NPO status , Patient's Chart, lab work & pertinent test results, reviewed documented beta blocker date and time   Airway Mallampati: III  TM Distance: <3 FB     Dental  (+) Chipped   Pulmonary Current Smoker and Patient abstained from smoking.,    Pulmonary exam normal        Cardiovascular hypertension, Pt. on medications and Pt. on home beta blockers Normal cardiovascular exam     Neuro/Psych negative neurological ROS  negative psych ROS   GI/Hepatic negative GI ROS, Neg liver ROS,   Endo/Other  diabetes, Well Controlled  Renal/GU negative Renal ROS  negative genitourinary   Musculoskeletal negative musculoskeletal ROS (+)   Abdominal Normal abdominal exam  (+)   Peds negative pediatric ROS (+)  Hematology negative hematology ROS (+)   Anesthesia Other Findings   Reproductive/Obstetrics                             Anesthesia Physical Anesthesia Plan  ASA: II  Anesthesia Plan: General   Post-op Pain Management:    Induction: Intravenous  PONV Risk Score and Plan:   Airway Management Planned: LMA  Additional Equipment:   Intra-op Plan:   Post-operative Plan: Extubation in OR  Informed Consent: I have reviewed the patients History and Physical, chart, labs and discussed the procedure including the risks, benefits and alternatives for the proposed anesthesia with the patient or authorized representative who has indicated his/her understanding and acceptance.     Dental advisory given  Plan Discussed with: CRNA and Surgeon  Anesthesia Plan Comments:         Anesthesia Quick Evaluation

## 2019-04-14 NOTE — OR Nursing (Signed)
Jamesetta So, RN spoke with MD Claudine Mouton about scant drainage to affected breast. No further treatment necessary , pt is stable for discharge per MD

## 2019-04-14 NOTE — Discharge Instructions (Signed)

## 2019-04-14 NOTE — Anesthesia Postprocedure Evaluation (Signed)
Anesthesia Post Note  Patient: Madison Ritter  Procedure(s) Performed: EXCISION DUCTAL SYSTEM BREAST - NO SENTINEL NODE (Left Breast)  Patient location during evaluation: PACU Anesthesia Type: General Level of consciousness: awake and alert Pain management: pain level controlled Vital Signs Assessment: post-procedure vital signs reviewed and stable Respiratory status: spontaneous breathing, nonlabored ventilation, respiratory function stable and patient connected to nasal cannula oxygen Cardiovascular status: blood pressure returned to baseline and stable Postop Assessment: no apparent nausea or vomiting Anesthetic complications: no     Last Vitals:  Vitals:   04/14/19 1630 04/14/19 1644  BP: (!) 168/90 (!) 171/87  Pulse: 63 65  Resp: (!) 21 16  Temp:  (!) 36.1 C  SpO2: 97% 97%    Last Pain:  Vitals:   04/14/19 1644  TempSrc: Tympanic  PainSc: 4                  Corinda Gubler

## 2019-04-14 NOTE — Transfer of Care (Signed)
Immediate Anesthesia Transfer of Care Note  Patient: Madison Ritter  Procedure(s) Performed: EXCISION DUCTAL SYSTEM BREAST - NO SENTINEL NODE (Left Breast)  Patient Location: PACU  Anesthesia Type:General  Level of Consciousness: drowsy  Airway & Oxygen Therapy: Patient Spontanous Breathing and Patient connected to face mask oxygen  Post-op Assessment: Report given to RN and Post -op Vital signs reviewed and stable  Post vital signs: Reviewed and stable  Last Vitals:  Vitals Value Taken Time  BP 103/70 04/14/19 1530  Temp    Pulse 56 04/14/19 1533  Resp 11 04/14/19 1533  SpO2 98 % 04/14/19 1533  Vitals shown include unvalidated device data.  Last Pain:  Vitals:   04/14/19 1115  TempSrc: Tympanic  PainSc: 0-No pain         Complications: No apparent anesthesia complications

## 2019-04-14 NOTE — Anesthesia Procedure Notes (Signed)
Procedure Name: LMA Insertion Date/Time: 04/14/2019 2:51 PM Performed by: Elmarie Mainland, CRNA Pre-anesthesia Checklist: Patient identified, Emergency Drugs available, Suction available and Patient being monitored Patient Re-evaluated:Patient Re-evaluated prior to induction Oxygen Delivery Method: Circle system utilized Preoxygenation: Pre-oxygenation with 100% oxygen Induction Type: IV induction Ventilation: Mask ventilation without difficulty LMA: LMA inserted LMA Size: 4.0 Number of attempts: 1 Placement Confirmation: positive ETCO2 Tube secured with: Tape Dental Injury: Teeth and Oropharynx as per pre-operative assessment

## 2019-04-16 LAB — SURGICAL PATHOLOGY

## 2019-04-22 ENCOUNTER — Ambulatory Visit (INDEPENDENT_AMBULATORY_CARE_PROVIDER_SITE_OTHER): Payer: Self-pay | Admitting: Surgery

## 2019-04-22 ENCOUNTER — Other Ambulatory Visit: Payer: Self-pay

## 2019-04-22 ENCOUNTER — Encounter: Payer: Self-pay | Admitting: Surgery

## 2019-04-22 VITALS — BP 169/83 | HR 73 | Temp 97.7°F | Resp 14 | Ht 64.0 in | Wt 195.0 lb

## 2019-04-22 DIAGNOSIS — N6002 Solitary cyst of left breast: Secondary | ICD-10-CM

## 2019-04-22 NOTE — Progress Notes (Signed)
[facility] POST-OP OFFICE VISIT  04/22/2019  HPI: Madison Ritter is a 59 y.o. female 9 days s/p excision of left nipple cyst.  Vital signs: BP (!) 169/83   Pulse 73   Temp 97.7 F (36.5 C)   Resp 14   Ht 5\' 4"  (1.626 m)   Wt 195 lb (88.5 kg)   SpO2 98%   BMI 33.47 kg/m    Physical Exam: Constitutional: Appears well, jovial and quite pleased.  Skin: The nipple appears to be healing nicely.  There is no appreciable ecchymosis or hematoma.  The skin has no evidence of erythema.  Is nontender.  She did note some other bruising from her left flank and left scapular area, all these areas have resolved with minimal trace of ecchymosis except from her scapular area.  Ecchymosis from right volar forearm from prior IV placement.  No induration, no tenderness.  Assessment/Plan: This is a 59 y.o. female 9 days s/p excision of dermal cyst from left nipple  Patient Active Problem List   Diagnosis Date Noted  . Hematuria 08/18/2015  . Adult BMI 30+ 01/11/2015  . Diabetes mellitus type 2, uncomplicated ([facility]) 01/11/2015  . Hypercholesteremia 01/11/2015  . Vitamin D deficiency 01/11/2015  . Carpal tunnel syndrome 04/22/2009  . Current tobacco use 02/13/2009  . Essential (primary) hypertension 03/18/1998    -Follow-up as needed.   05/17/1998 M.D., FACS 04/22/2019, 11:35 AM

## 2019-04-22 NOTE — Patient Instructions (Signed)
Please call our office if you have any questions or concerns.  

## 2019-05-10 ENCOUNTER — Encounter: Payer: Self-pay | Admitting: Physician Assistant

## 2019-05-10 ENCOUNTER — Ambulatory Visit (INDEPENDENT_AMBULATORY_CARE_PROVIDER_SITE_OTHER): Payer: BC Managed Care – PPO | Admitting: Physician Assistant

## 2019-05-10 ENCOUNTER — Other Ambulatory Visit: Payer: Self-pay

## 2019-05-10 VITALS — BP 128/86 | HR 70 | Temp 96.5°F | Wt 194.0 lb

## 2019-05-10 DIAGNOSIS — I10 Essential (primary) hypertension: Secondary | ICD-10-CM | POA: Diagnosis not present

## 2019-05-10 DIAGNOSIS — T3695XA Adverse effect of unspecified systemic antibiotic, initial encounter: Secondary | ICD-10-CM

## 2019-05-10 DIAGNOSIS — E119 Type 2 diabetes mellitus without complications: Secondary | ICD-10-CM

## 2019-05-10 DIAGNOSIS — B379 Candidiasis, unspecified: Secondary | ICD-10-CM

## 2019-05-10 DIAGNOSIS — G44209 Tension-type headache, unspecified, not intractable: Secondary | ICD-10-CM

## 2019-05-10 DIAGNOSIS — E7800 Pure hypercholesterolemia, unspecified: Secondary | ICD-10-CM | POA: Diagnosis not present

## 2019-05-10 DIAGNOSIS — E876 Hypokalemia: Secondary | ICD-10-CM

## 2019-05-10 LAB — POCT GLYCOSYLATED HEMOGLOBIN (HGB A1C): Hemoglobin A1C: 7.6 % — AB (ref 4.0–5.6)

## 2019-05-10 MED ORDER — FLUCONAZOLE 150 MG PO TABS: 150.0000 mg | ORAL_TABLET | Freq: Once | ORAL | 0 refills | Status: AC

## 2019-05-10 MED ORDER — METFORMIN HCL 500 MG PO TABS: 500.0000 mg | ORAL_TABLET | Freq: Every day | ORAL | 1 refills | Status: DC

## 2019-05-10 NOTE — Progress Notes (Signed)
Patient: Madison Ritter Female    DOB: [birth year]   59 y.o.   MRN: [id number] Visit Date: 05/10/2019  Today's Provider: Margaretann Loveless, PA-C   Chief Complaint  Patient presents with  . Diabetes  . Hyperlipidemia  . Hypertension   Subjective:     HPI    Diabetes Mellitus Type II, Follow-up:   Lab Results  Component Value Date   HGBA1C 7.6 (A) 05/10/2019   HGBA1C 7.7 (H) 01/07/2019   HGBA1C 7.0 (H) 12/05/2017   Last seen for diabetes 4 months ago.  Management since then includes recommend restarting metformin. She reports poor compliance with treatment. She is not having side effects.  Current symptoms include none and have been stable. Home blood sugar records: not being checked  Episodes of hypoglycemia? No   Current Insulin Regimen: none Most Recent Eye Exam: not up to date Weight trend: increasing steadily Prior visit with dietician: no Current diet: in general, an "unhealthy" diet Current exercise: none  ------------------------------------------------------------------------   Hypertension, follow-up:  BP Readings from Last 3 Encounters:  05/10/19 128/86  04/22/19 (!) 169/83  04/14/19 (!) 162/90    She was last seen for hypertension 3 months ago.  BP at that visit was 140/90. Management since that visit includes no changes She reports excellent compliance with treatment. She is not having side effects.  She is not exercising. She is adherent to low salt diet.   Outside blood pressures are not being checked. She is experiencing none.  Patient denies chest pain, chest pressure/discomfort, dyspnea, exertional chest pressure/discomfort, lower extremity edema and palpitations.   Cardiovascular risk factors include diabetes mellitus, dyslipidemia, family history of premature cardiovascular disease, hypertension, obesity (BMI >= 30 kg/m2), sedentary lifestyle and smoking/ tobacco exposure.  Use of agents associated with hypertension: none.    ------------------------------------------------------------------------    Lipid/Cholesterol, Follow-up:   Last seen for this 6 months ago.  Management since that visit includes none.  Last Lipid Panel:    Component Value Date/Time   CHOL 214 (H) 01/07/2019 1434   TRIG 195 (H) 01/07/2019 1434   HDL 56 01/07/2019 1434   CHOLHDL 3.5 12/05/2017 1128   LDLCALC 124 (H) 01/07/2019 1434    She reports excellent compliance with treatment. She is not having side effects.   Wt Readings from Last 3 Encounters:  05/10/19 194 lb (88 kg)  04/22/19 195 lb (88.5 kg)  04/08/19 180 lb (81.6 kg)   ------------------------------------------------------------------------    Allergies  Allergen Reactions  . Sulfa Antibiotics Hives     Current Outpatient Medications:  .  acetaminophen (TYLENOL) 500 MG tablet, Take 1,000 mg by mouth every 6 (six) hours as needed (for pain.)., Disp: , Rfl:  .  amLODIPine-Valsartan-HCTZ 10-160-25 MG TABS, Take 1 tablet by mouth once daily (Patient taking differently: Take 1 tablet by mouth daily after breakfast. ), Disp: 90 tablet, Rfl: 1 .  Black Cohosh 40 MG CAPS, Take 80 mg by mouth daily after breakfast., Disp: , Rfl:  .  gentamicin cream (GARAMYCIN) 0.1 %, Apply 1 application topically 2 (two) times daily., Disp: 15 g, Rfl: 1 .  metoprolol succinate (TOPROL-XL) 50 MG 24 hr tablet, Take 1 tablet (50 mg total) by mouth daily. Take with or immediately following a meal. (Patient taking differently: Take 50 mg by mouth daily after breakfast. Take with or immediately following a meal.), Disp: 90 tablet, Rfl: 3 .  potassium chloride (KLOR-CON) 10 MEQ tablet, Take 1 tablet (10 mEq total)  by mouth 3 (three) times daily., Disp: 6 tablet, Rfl: 0 .  fluconazole (DIFLUCAN) 150 MG tablet, Take 1 tablet (150 mg total) by mouth once for 1 dose. May repeat in 48-72 hrs if needed, Disp: 2 tablet, Rfl: 0 .  metFORMIN (GLUCOPHAGE) 500 MG tablet, Take 1 tablet (500 mg total)  by mouth daily with breakfast., Disp: 90 tablet, Rfl: 1  Review of Systems  Constitutional: Positive for fatigue. Negative for activity change, appetite change, chills, diaphoresis, fever and unexpected weight change.  Respiratory: Negative.   Cardiovascular: Negative.   Gastrointestinal: Negative.   Endocrine: Negative.   Musculoskeletal: Positive for arthralgias and neck pain.  Neurological: Positive for light-headedness (Rarely) and headaches (almost everyday.). Negative for dizziness.    Social History   Tobacco Use  . Smoking status: Current Every Day Smoker    Packs/day: 0.10    Years: 10.00    Pack years: 1.00    Types: Cigars  . Smokeless tobacco: Never Used  . Tobacco comment: 1 cigar daily  Substance Use Topics  . Alcohol use: Yes    Alcohol/week: 2.0 standard drinks    Types: 2 Standard drinks or equivalent per week      Objective:   BP 128/86 (BP Location: Left Arm, Patient Position: Sitting, Cuff Size: Large)   Pulse 70   Temp (!) 96.5 F (35.8 C) (Temporal)   Wt 194 lb (88 kg)   BMI 33.30 kg/m  Vitals:   05/10/19 1045  BP: 128/86  Pulse: 70  Temp: (!) 96.5 F (35.8 C)  TempSrc: Temporal  Weight: 194 lb (88 kg)  Body mass index is 33.3 kg/m.   Physical Exam Vitals reviewed.  Constitutional:      General: She is not in acute distress.    Appearance: Normal appearance. She is well-developed. She is obese. She is not ill-appearing or diaphoretic.  HENT:     Head: Normocephalic and atraumatic.  Eyes:     General: No scleral icterus.    Extraocular Movements: Extraocular movements intact.  Cardiovascular:     Rate and Rhythm: Normal rate and regular rhythm.     Pulses: Normal pulses.     Heart sounds: Normal heart sounds. No murmur. No friction rub. No gallop.   Pulmonary:     Effort: Pulmonary effort is normal. No respiratory distress.     Breath sounds: Normal breath sounds. No wheezing or rales.  Musculoskeletal:     Cervical back: Normal  range of motion and neck supple.     Right lower leg: No edema.     Left lower leg: No edema.  Neurological:     General: No focal deficit present.     Mental Status: She is alert. Mental status is at baseline.  Psychiatric:        Mood and Affect: Mood normal.        Behavior: Behavior normal.        Thought Content: Thought content normal.        Judgment: Judgment normal.      Results for orders placed or performed in visit on 05/10/19  POCT glycosylated hemoglobin (Hb A1C)  Result Value Ref Range   Hemoglobin A1C 7.6 (A) 4.0 - 5.6 %       Assessment & Plan    1. Essential (primary) hypertension Much improved today. Continue Amlodipine-Valsartan-HCTZ.  2. Type 2 diabetes mellitus without complication, without long-term current use of insulin (HCC) A1c improved slightly from 7.7 to 7.6. Patient never  started metformin due to not being told her lab results from October. She is agreeable to restart Metformin. Will send as below. Return in 3 months to recheck.  - POCT glycosylated hemoglobin (Hb A1C) - metFORMIN (GLUCOPHAGE) 500 MG tablet; Take 1 tablet (500 mg total) by mouth daily with breakfast.  Dispense: 90 tablet; Refill: 1  3. Hypercholesteremia Does not desire medication at this time.   4. Antibiotic-induced yeast infection Noted to have vaginal itching and irritation, mild discharge. Will send diflucan as below. Most likely secondary to elevated sugar levels and having recent breast surgery. Call if not improving.  - fluconazole (DIFLUCAN) 150 MG tablet; Take 1 tablet (150 mg total) by mouth once for 1 dose. May repeat in 48-72 hrs if needed  Dispense: 2 tablet; Refill: 0  5. Tension headache Daily. Has a lot of stress with work, taking care of her husband and also caring for her mother. Discussed massage, having a good pillow and getting good rest. Using Ibuprofen or aleve prn. May decide to add muscle relaxer in the future. Going to try new pillow, a time of reprieve  from her husband for a weekend and getting therapeutic massages.   6. Hypokalemia Noted on labs previously but was replenished during breast surgery. Also had been placed on a supplement. Will need to recheck potassium level when she returns in 3 months. Also check CBC for elevated white blood cell count.       Mar Daring, PA-C  [facility]

## 2019-05-10 NOTE — Patient Instructions (Signed)
Potassium Rich Foods photo of potassium foods IN THIS ARTICLE Food Sources of Potassium How Much You Need On the Label? Why You Need Potassium If you're like most people in the [place] you likely don't get enough potassium in your diet.  Like calcium and sodium, potassium is a mineral that's found in some foods. Having the right amount of potassium in your diet helps to keep you healthy, so it's crucial to eat plenty of potassium-rich foods.  Food Sources of Potassium Many of the foods that you already eat contain potassium. The foods listed below are high in potassium. If you need to boost the amount of potassium in your diet, make healthy food choices by picking items below to add to your menu.  Many fresh fruits and vegetables are rich in potassium:  Bananas, oranges, cantaloupe, honeydew, apricots, grapefruit (some dried fruits, such as prunes, raisins, and dates, are also high in potassium) Cooked spinach Cooked broccoli Potatoes Sweet potatoes Mushrooms Peas Cucumbers Zucchini Pumpkins Leafy greens Juice from potassium-rich fruit is also a good choice:  Orange juice  Tomato juice Prune juice Apricot juice Grapefruit juice Certain dairy products, such as milk and yogurt, are high in potassium (low-fat or fat-free is best).  Some fish contain potassium:  [employer] or legumes that are high in potassium include:  Lima beans Pinto beans Kidney beans Soybeans Lentils Other foods that are rich in potassium include:  Salt substitutes (read labels to check potassium levels) Molasses Nuts Meat and poultry Brown and wild rice Bran cereal Whole-wheat bread and pasta How Much You Need You should get 4,700 milligrams (mg) of potassium every day. Most Americans don't meet that goal.  Your needs might be different if you have kidney disease. Some people with kidney disease should get less potassium than the 4,700 mg guideline. If your kidneys  don't work well, too much potassium could stay in your body, which can cause nerve and muscle problems. If you have kidney disease and your doctor hasn't already told you what your potassium limit is, ask about it.  On the Label? For a long time, potassium wasn't listed on the Nutrition Facts food labels of packaged food items. But in May 2016, the Nutrition Facts rules were changed, and potassium will now be listed. Companies will need to update their food labels on or before January 2020. That should make it easier for you to track your potassium intake for better health.

## 2019-06-15 ENCOUNTER — Telehealth: Payer: Self-pay

## 2019-06-15 DIAGNOSIS — I10 Essential (primary) hypertension: Secondary | ICD-10-CM

## 2019-06-15 MED ORDER — AMLODIPINE BESYLATE 10 MG PO TABS: 10.0000 mg | ORAL_TABLET | Freq: Every day | ORAL | 0 refills | Status: DC

## 2019-06-15 MED ORDER — VALSARTAN-HYDROCHLOROTHIAZIDE 160-25 MG PO TABS: 1.0000 | ORAL_TABLET | Freq: Every day | ORAL | 0 refills | Status: DC

## 2019-06-15 NOTE — Addendum Note (Signed)
Addended by: Margaretann Loveless on: 06/15/2019 12:42 PM   Modules accepted: Orders

## 2019-06-15 NOTE — Telephone Encounter (Signed)
Sent in amlodipine and valsartan-hctz

## 2019-06-15 NOTE — Telephone Encounter (Signed)
Copied from CRM [id number]. Topic: General - Inquiry >> Jun 15, 2019 10:47 AM Deborha Payment wrote: Reason for CRM: Catlyin from [facility] in [place] called requesting a spilt medication amLODIPine-Valsartan-HCTZ 10-160-25 MG TABS  Pharmacy states medication is on back order and would like medication to be split into 1 -2 medications. Call back [phone]

## 2019-08-09 ENCOUNTER — Encounter: Payer: Self-pay | Admitting: Physician Assistant

## 2019-08-09 ENCOUNTER — Ambulatory Visit (INDEPENDENT_AMBULATORY_CARE_PROVIDER_SITE_OTHER): Payer: BC Managed Care – PPO | Admitting: Physician Assistant

## 2019-08-09 ENCOUNTER — Other Ambulatory Visit: Payer: Self-pay

## 2019-08-09 VITALS — BP 145/94 | HR 66 | Temp 97.0°F | Resp 16 | Wt 196.6 lb

## 2019-08-09 DIAGNOSIS — I10 Essential (primary) hypertension: Secondary | ICD-10-CM

## 2019-08-09 DIAGNOSIS — E119 Type 2 diabetes mellitus without complications: Secondary | ICD-10-CM

## 2019-08-09 DIAGNOSIS — G44219 Episodic tension-type headache, not intractable: Secondary | ICD-10-CM

## 2019-08-09 LAB — POCT GLYCOSYLATED HEMOGLOBIN (HGB A1C)
Est. average glucose Bld gHb Est-mCnc: 166
Hemoglobin A1C: 7.4 % — AB (ref 4.0–5.6)

## 2019-08-09 NOTE — Patient Instructions (Signed)
DASH Eating Plan DASH stands for "Dietary Approaches to Stop Hypertension." The DASH eating plan is a healthy eating plan that has been shown to reduce high blood pressure (hypertension). It may also reduce your risk for type 2 diabetes, heart disease, and stroke. The DASH eating plan may also help with weight loss. What are tips for following this plan?  General guidelines  Avoid eating more than 2,300 mg (milligrams) of salt (sodium) a day. If you have hypertension, you may need to reduce your sodium intake to 1,500 mg a day.  Limit alcohol intake to no more than 1 drink a day for nonpregnant women and 2 drinks a day for men. One drink equals 12 oz of beer, 5 oz of wine, or 1 oz of hard liquor.  Work with your health care provider to maintain a healthy body weight or to lose weight. Ask what an ideal weight is for you.  Get at least 30 minutes of exercise that causes your heart to beat faster (aerobic exercise) most days of the week. Activities may include walking, swimming, or biking.  Work with your health care provider or diet and nutrition specialist (dietitian) to adjust your eating plan to your individual calorie needs. Reading food labels   Check food labels for the amount of sodium per serving. Choose foods with less than 5 percent of the Daily Value of sodium. Generally, foods with less than 300 mg of sodium per serving fit into this eating plan.  To find whole grains, look for the word "whole" as the first word in the ingredient list. Shopping  Buy products labeled as "low-sodium" or "no salt added."  Buy fresh foods. Avoid canned foods and premade or frozen meals. Cooking  Avoid adding salt when cooking. Use salt-free seasonings or herbs instead of table salt or sea salt. Check with your health care provider or pharmacist before using salt substitutes.  Do not fry foods. Cook foods using healthy methods such as baking, boiling, grilling, and broiling instead.  Cook with  heart-healthy oils, such as olive, canola, soybean, or sunflower oil. Meal planning  Eat a balanced diet that includes: ? 5 or more servings of fruits and vegetables each day. At each meal, try to fill half of your plate with fruits and vegetables. ? Up to 6-8 servings of whole grains each day. ? Less than 6 oz of lean meat, poultry, or fish each day. A 3-oz serving of meat is about the same size as a deck of cards. One egg equals 1 oz. ? 2 servings of low-fat dairy each day. ? A serving of nuts, seeds, or beans 5 times each week. ? Heart-healthy fats. Healthy fats called Omega-3 fatty acids are found in foods such as flaxseeds and coldwater fish, like sardines, salmon, and mackerel.  Limit how much you eat of the following: ? Canned or prepackaged foods. ? Food that is high in trans fat, such as fried foods. ? Food that is high in saturated fat, such as fatty meat. ? Sweets, desserts, sugary drinks, and other foods with added sugar. ? Full-fat dairy products.  Do not salt foods before eating.  Try to eat at least 2 vegetarian meals each week.  Eat more home-cooked food and less restaurant, buffet, and fast food.  When eating at a restaurant, ask that your food be prepared with less salt or no salt, if possible. What foods are recommended? The items listed may not be a complete list. Talk with your dietitian about   what dietary choices are best for you. Grains Whole-grain or whole-wheat bread. Whole-grain or whole-wheat pasta. Brown rice. Oatmeal. Quinoa. Bulgur. Whole-grain and low-sodium cereals. Pita bread. Low-fat, low-sodium crackers. Whole-wheat flour tortillas. Vegetables Fresh or frozen vegetables (raw, steamed, roasted, or grilled). Low-sodium or reduced-sodium tomato and vegetable juice. Low-sodium or reduced-sodium tomato sauce and tomato paste. Low-sodium or reduced-sodium canned vegetables. Fruits All fresh, dried, or frozen fruit. Canned fruit in natural juice (without  added sugar). Meat and other protein foods Skinless chicken or turkey. Ground chicken or turkey. Pork with fat trimmed off. Fish and seafood. Egg whites. Dried beans, peas, or lentils. Unsalted nuts, nut butters, and seeds. Unsalted canned beans. Lean cuts of beef with fat trimmed off. Low-sodium, lean deli meat. Dairy Low-fat (1%) or fat-free (skim) milk. Fat-free, low-fat, or reduced-fat cheeses. Nonfat, low-sodium ricotta or cottage cheese. Low-fat or nonfat yogurt. Low-fat, low-sodium cheese. Fats and oils Soft margarine without trans fats. Vegetable oil. Low-fat, reduced-fat, or light mayonnaise and salad dressings (reduced-sodium). Canola, safflower, olive, soybean, and sunflower oils. Avocado. Seasoning and other foods Herbs. Spices. Seasoning mixes without salt. Unsalted popcorn and pretzels. Fat-free sweets. What foods are not recommended? The items listed may not be a complete list. Talk with your dietitian about what dietary choices are best for you. Grains Baked goods made with fat, such as croissants, muffins, or some breads. Dry pasta or rice meal packs. Vegetables Creamed or fried vegetables. Vegetables in a cheese sauce. Regular canned vegetables (not low-sodium or reduced-sodium). Regular canned tomato sauce and paste (not low-sodium or reduced-sodium). Regular tomato and vegetable juice (not low-sodium or reduced-sodium). Pickles. Olives. Fruits Canned fruit in a light or heavy syrup. Fried fruit. Fruit in cream or butter sauce. Meat and other protein foods Fatty cuts of meat. Ribs. Fried meat. Bacon. Sausage. Bologna and other processed lunch meats. Salami. Fatback. Hotdogs. Bratwurst. Salted nuts and seeds. Canned beans with added salt. Canned or smoked fish. Whole eggs or egg yolks. Chicken or turkey with skin. Dairy Whole or 2% milk, cream, and half-and-half. Whole or full-fat cream cheese. Whole-fat or sweetened yogurt. Full-fat cheese. Nondairy creamers. Whipped toppings.  Processed cheese and cheese spreads. Fats and oils Butter. Stick margarine. Lard. Shortening. Ghee. Bacon fat. Tropical oils, such as coconut, palm kernel, or palm oil. Seasoning and other foods Salted popcorn and pretzels. Onion salt, garlic salt, seasoned salt, table salt, and sea salt. Worcestershire sauce. Tartar sauce. Barbecue sauce. Teriyaki sauce. Soy sauce, including reduced-sodium. Steak sauce. Canned and packaged gravies. Fish sauce. Oyster sauce. Cocktail sauce. Horseradish that you find on the shelf. Ketchup. Mustard. Meat flavorings and tenderizers. Bouillon cubes. Hot sauce and Tabasco sauce. Premade or packaged marinades. Premade or packaged taco seasonings. Relishes. Regular salad dressings. Where to find more information:  National Heart, Lung, and Blood Institute: www.nhlbi.nih.gov  American Heart Association: www.heart.org Summary  The DASH eating plan is a healthy eating plan that has been shown to reduce high blood pressure (hypertension). It may also reduce your risk for type 2 diabetes, heart disease, and stroke.  With the DASH eating plan, you should limit salt (sodium) intake to 2,300 mg a day. If you have hypertension, you may need to reduce your sodium intake to 1,500 mg a day.  When on the DASH eating plan, aim to eat more fresh fruits and vegetables, whole grains, lean proteins, low-fat dairy, and heart-healthy fats.  Work with your health care provider or diet and nutrition specialist (dietitian) to adjust your eating plan to your   individual calorie needs. This information is not intended to replace advice given to you by your health care provider. Make sure you discuss any questions you have with your health care provider. Document Revised: 02/14/2017 Document Reviewed: 02/26/2016 Elsevier Patient Education  2020 [employer]  

## 2019-08-09 NOTE — Progress Notes (Signed)
Established patient visit   Patient: Madison Ritter   DOB: 06-Apr-1960   60 y.o. Female  MRN: [id number] Visit Date: 08/09/2019  Today's healthcare provider: Margaretann Loveless, PA-C   Chief Complaint  Patient presents with  . Follow-up    T2DM and HTN   Subjective    HPI Diabetes Mellitus Type II, Follow-up  Lab Results  Component Value Date   HGBA1C 7.4 (A) 08/09/2019   HGBA1C 7.6 (A) 05/10/2019   HGBA1C 7.7 (H) 01/07/2019   Wt Readings from Last 3 Encounters:  08/09/19 196 lb 9.6 oz (89.2 kg)  05/10/19 194 lb (88 kg)  04/22/19 195 lb (88.5 kg)   Last seen for diabetes 3 months ago.  Management since then includes restart Metformin 500 MG tablet. She reports excellent compliance with treatment. She is not having side effects.   Symptoms: No fatigue No foot ulcerations  No appetite changes No nausea  No paresthesia of the feet  No polydipsia  No polyuria No visual disturbances   No vomiting     Home blood sugar records: not being checked    Current insulin regiment: none Most Recent Eye Exam: not UTD Current exercise: none Current diet habits: general diet  Pertinent Labs: Lab Results  Component Value Date   CHOL 214 (H) 01/07/2019   HDL 56 01/07/2019   LDLCALC 124 (H) 01/07/2019   TRIG 195 (H) 01/07/2019   CHOLHDL 3.5 12/05/2017   Lab Results  Component Value Date   NA 139 04/14/2019   K 3.7 04/14/2019   CREATININE 0.70 04/14/2019   GFRNONAA >60 04/12/2019   GFRAA >60 04/12/2019   GLUCOSE 150 (H) 04/14/2019     Hypertension, follow-up  BP Readings from Last 3 Encounters:  08/09/19 (!) 145/94  05/10/19 128/86  04/22/19 (!) 169/83   Wt Readings from Last 3 Encounters:  08/09/19 196 lb 9.6 oz (89.2 kg)  05/10/19 194 lb (88 kg)  04/22/19 195 lb (88.5 kg)     She was last seen for hypertension 3 months ago.  BP at that visit was 128/86. Management since that visit includes Continue Amlodipine-Valsartan-HCTZ.  She reports excellent  compliance with treatment. She is not having side effects.  She is following a Regular diet. She is not exercising. She does smoke.  Outside blood pressures are not being checked.  Symptoms: No chest pain No chest pressure  No palpitations No syncope  No dyspnea No orthopnea  No paroxysmal nocturnal dyspnea No lower extremity edema    She reports that she gets headaches 2-3 times a week. She takes Tylenol sometimes and it helps. Feels headaches are always related to stress. Most occur after her and her husband have argued. He is having health issues and relies on her for a lot.   ---------------------------------------------------------------------------------------------------   Medications: Outpatient Medications Prior to Visit  Medication Sig  . acetaminophen (TYLENOL) 500 MG tablet Take 1,000 mg by mouth every 6 (six) hours as needed (for pain.).  Marland Kitchen amLODipine (NORVASC) 10 MG tablet Take 1 tablet (10 mg total) by mouth daily.  . Black Cohosh 40 MG CAPS Take 80 mg by mouth daily after breakfast.  . metFORMIN (GLUCOPHAGE) 500 MG tablet Take 1 tablet (500 mg total) by mouth daily with breakfast.  . metoprolol succinate (TOPROL-XL) 50 MG 24 hr tablet Take 1 tablet (50 mg total) by mouth daily. Take with or immediately following a meal. (Patient taking differently: Take 50 mg by mouth daily after breakfast. Take with  or immediately following a meal.)  . potassium chloride (KLOR-CON) 10 MEQ tablet Take 1 tablet (10 mEq total) by mouth 3 (three) times daily.  . valsartan-hydrochlorothiazide (DIOVAN-HCT) 160-25 MG tablet Take 1 tablet by mouth daily.  Marland Kitchen gentamicin cream (GARAMYCIN) 0.1 % Apply 1 application topically 2 (two) times daily.   No facility-administered medications prior to visit.    Review of Systems  Constitutional: Negative for appetite change, chills, fatigue and fever.  Respiratory: Negative for chest tightness and shortness of breath.   Cardiovascular: Negative for  chest pain and palpitations.  Gastrointestinal: Negative for abdominal pain, nausea and vomiting.  Endocrine: Negative for polydipsia, polyphagia and polyuria.  Neurological: Positive for headaches. Negative for dizziness, weakness and numbness.       Objective    BP (!) 145/94 (BP Location: Left Arm, Patient Position: Sitting, Cuff Size: Large) Comment: has not taken BP this AM  Pulse 66   Temp (!) 97 F (36.1 C) (Temporal)   Resp 16   Wt 196 lb 9.6 oz (89.2 kg)   BMI 33.75 kg/m     Physical Exam Vitals reviewed.  Constitutional:      General: She is not in acute distress.    Appearance: Normal appearance. She is well-developed. She is obese. She is not ill-appearing or diaphoretic.  Cardiovascular:     Rate and Rhythm: Normal rate and regular rhythm.     Pulses: Normal pulses.     Heart sounds: Normal heart sounds. No murmur. No friction rub. No gallop.   Pulmonary:     Effort: Pulmonary effort is normal. No respiratory distress.     Breath sounds: Normal breath sounds. No wheezing or rales.  Musculoskeletal:     Cervical back: Normal range of motion and neck supple.  Neurological:     Mental Status: She is alert.      Results for orders placed or performed in visit on 08/09/19  POCT glycosylated hemoglobin (Hb A1C)  Result Value Ref Range   Hemoglobin A1C 7.4 (A) 4.0 - 5.6 %   Est. average glucose Bld gHb Est-mCnc 166     Assessment & Plan     1. Type 2 diabetes mellitus without complication, without long-term current use of insulin (HCC) A1c improving to 7.4 from 7.6. Continue Metformin 500mg  daily. Return in 5 months for CPE.   2. Essential (primary) hypertension Elevated today but patient admits to not taking medication today. Discussed importance of taking daily. Continue Amlodipine 10mg , valsartan-hctz 160-25mg  and metoprolol XR 50mg  daily. Will start checking BP at home at least once weekly and call if >140/90 consistently.  3. Episodic tension-type  headache, not intractable Most associated with external stressors. Discussed relaxation techniques including meditation and yoga. May use tylenol prn.   No follow-ups on file.      Reynolds Bowl, PA-C, have reviewed all documentation for this visit. The documentation on 08/09/19 for the exam, diagnosis, procedures, and orders are all accurate and complete.   Rubye Beach  [facility] [phone] (phone) [phone] (fax)  [place]

## 2019-10-14 ENCOUNTER — Other Ambulatory Visit: Payer: Self-pay | Admitting: Physician Assistant

## 2019-10-14 DIAGNOSIS — I10 Essential (primary) hypertension: Secondary | ICD-10-CM

## 2019-10-31 ENCOUNTER — Other Ambulatory Visit: Payer: Self-pay | Admitting: Physician Assistant

## 2019-10-31 DIAGNOSIS — E119 Type 2 diabetes mellitus without complications: Secondary | ICD-10-CM

## 2019-10-31 NOTE — Telephone Encounter (Signed)
Requested Prescriptions  Pending Prescriptions Disp Refills   metFORMIN (GLUCOPHAGE) 500 MG tablet [Pharmacy Med Name: METFORMIN 500MG TABLETS] 90 tablet 1    Sig: TAKE 1 TABLET(500 MG) BY MOUTH DAILY WITH BREAKFAST     Endocrinology:  Diabetes - Biguanides Passed - 10/31/2019  3:18 AM      Passed - Cr in normal range and within 360 days    Creatinine, Ser  Date Value Ref Range Status  04/14/2019 0.70 0.44 - 1.00 mg/dL Final         Passed - HBA1C is between 0 and 7.9 and within 180 days    Hemoglobin A1C  Date Value Ref Range Status  08/09/2019 7.4 (A) 4.0 - 5.6 % Final   Hgb A1c MFr Bld  Date Value Ref Range Status  01/07/2019 7.7 (H) 4.8 - 5.6 % Final    Comment:             Prediabetes: 5.7 - 6.4          Diabetes: >6.4          Glycemic control for adults with diabetes: <7.0          Passed - eGFR in normal range and within 360 days    GFR calc Af Amer  Date Value Ref Range Status  04/12/2019 >60 >60 mL/min Final   GFR calc non Af Amer  Date Value Ref Range Status  04/12/2019 >60 >60 mL/min Final         Passed - Valid encounter within last 6 months    Recent Outpatient Visits          2 months ago Type 2 diabetes mellitus without complication, without long-term current use of insulin [facility])   [place], [place], PA-C   5 months ago Essential (primary) hypertension   [employer], [place], [place]   8 months ago Essential hypertension   [employer], Clearnce Sorrel, [place]   9 months ago Annual physical exam   [employer], Clearnce Sorrel, [place]   1 year ago Annual physical exam   [place], Clearnce Sorrel, [place]      Future Appointments            In 2 months [place], [place]ce Sor[place], PA-C [employer], [place]

## 2019-12-19 ENCOUNTER — Other Ambulatory Visit: Payer: Self-pay | Admitting: Physician Assistant

## 2019-12-19 DIAGNOSIS — I10 Essential (primary) hypertension: Secondary | ICD-10-CM

## 2019-12-19 NOTE — Telephone Encounter (Signed)
Requested Prescriptions  Pending Prescriptions Disp Refills  . metoprolol succinate (TOPROL-XL) 50 MG 24 hr tablet [Pharmacy Med Name: METOPROLOL ER SUCCINATE 50MG  TABS] 90 tablet 3    Sig: TAKE 1 TABLET BY MOUTH DAILY. TAKE WITH OR IMMEDIATELY FOLLOWING A MEAL     Cardiovascular:  Beta Blockers Failed - 12/19/2019  8:09 AM      Failed - Last BP in normal range    BP Readings from Last 1 Encounters:  08/09/19 (!) 145/94         Passed - Last Heart Rate in normal range    Pulse Readings from Last 1 Encounters:  08/09/19 66         Passed - Valid encounter within last 6 months    Recent Outpatient Visits          4 months ago Type 2 diabetes mellitus without complication, without long-term current use of insulin [facility])   [facility] [place], [place], [place]   7 months ago Essential (primary) hypertension   [place], [place], [place]   10 months ago Essential hypertension   [facility], Margaretann Loveless   11 months ago Annual physical exam   [facility] [place], [place], Alessandra Bevels   2 years ago Annual physical exam   [facility] [place], [place], Alessandra Bevels      Future Appointments            In 1 month [place], [place], PA-C [place]andra Bevels, [facility]

## 2020-01-10 ENCOUNTER — Other Ambulatory Visit: Payer: Self-pay | Admitting: Physician Assistant

## 2020-01-10 DIAGNOSIS — I10 Essential (primary) hypertension: Secondary | ICD-10-CM

## 2020-01-13 ENCOUNTER — Encounter: Payer: Self-pay | Admitting: Physician Assistant

## 2020-01-16 ENCOUNTER — Other Ambulatory Visit: Payer: Self-pay | Admitting: Physician Assistant

## 2020-01-16 DIAGNOSIS — I10 Essential (primary) hypertension: Secondary | ICD-10-CM

## 2020-01-28 ENCOUNTER — Encounter: Payer: Self-pay | Admitting: Physician Assistant

## 2020-01-28 NOTE — Progress Notes (Deleted)
Complete physical exam   Patient: Madison Ritter   DOB: 1961-02-27   59 y.o. Female  MRN: [id number] Visit Date: 01/28/2020  Today's healthcare provider: Margaretann Loveless, PA-C   No chief complaint on file.  Subjective    Madison Ritter is a 59 y.o. female who presents today for a complete physical exam.  She reports consuming a {diet types:17450} diet. {Exercise:19826} She generally feels {well/fairly well/poorly:18703}. She reports sleeping {well/fairly well/poorly:18703}. She {does/does not:200015} have additional problems to discuss today.  HPI  11/27/2016 at 9:31 -Pap is normal, HPV negative. Will repeat in 3-5 years. 05/02/14-Colonoscopy-repeat in 37yrs  Past Medical History:  Diagnosis Date  . Hypertension    Past Surgical History:  Procedure Laterality Date  . ABDOMINAL HYSTERECTOMY  2004  . BREAST DUCTAL SYSTEM EXCISION Left 04/14/2019   Procedure: EXCISION DUCTAL SYSTEM BREAST - NO SENTINEL NODE;  Surgeon: Campbell Lerner, MD;  Location: [facility] ORS;  Service: General;  Laterality: Left;  . TUBAL LIGATION     Social History   Socioeconomic History  . Marital status: Married    Spouse name: Not on file  . Number of children: 1  . Years of education: Not on file  . Highest education level: Not on file  Occupational History  . Not on file  Tobacco Use  . Smoking status: Current Every Day Smoker    Packs/day: 0.10    Years: 10.00    Pack years: 1.00    Types: Cigars  . Smokeless tobacco: Never Used  . Tobacco comment: 1 cigar daily  Vaping Use  . Vaping Use: Never used  Substance and Sexual Activity  . Alcohol use: Yes    Alcohol/week: 2.0 standard drinks    Types: 2 Standard drinks or equivalent per week  . Drug use: No  . Sexual activity: Not on file  Other Topics Concern  . Not on file  Social History Narrative  . Not on file   Social Determinants of Health   Financial Resource Strain:   . Difficulty of Paying Living Expenses: Not on file   Food Insecurity:   . Worried About Programme researcher, broadcasting/film/video in the Last Year: Not on file  . Ran Out of Food in the Last Year: Not on file  Transportation Needs:   . Lack of Transportation (Medical): Not on file  . Lack of Transportation (Non-Medical): Not on file  Physical Activity:   . Days of Exercise per Week: Not on file  . Minutes of Exercise per Session: Not on file  Stress:   . Feeling of Stress : Not on file  Social Connections:   . Frequency of Communication with Friends and Family: Not on file  . Frequency of Social Gatherings with Friends and Family: Not on file  . Attends Religious Services: Not on file  . Active Member of Clubs or Organizations: Not on file  . Attends Banker Meetings: Not on file  . Marital Status: Not on file  Intimate Partner Violence:   . Fear of Current or Ex-Partner: Not on file  . Emotionally Abused: Not on file  . Physically Abused: Not on file  . Sexually Abused: Not on file   Family Status  Relation Name Status  . Mother  Alive  . Father  Deceased at age 22  . Sister  Alive  . Brother  Alive  . Brother  Alive  . Mat Aunt  (Not Specified)  . Other gr aunt Alive  Family History  Problem Relation Age of Onset  . Diabetes Mother   . Hypertension Mother   . Congestive Heart Failure Mother   . Sleep apnea Mother   . Stroke Mother   . Hypertension Father   . Head & neck cancer Father   . Thyroid disease Sister   . Hypertension Brother   . Stroke Brother   . Hypertension Brother   . Breast cancer Maternal Aunt 33  . Breast cancer Other    Allergies  Allergen Reactions  . Sulfa Antibiotics Hives    Patient Care Team: Margaretann Loveless, PA-C as PCP - General (Family Medicine)   Medications: Outpatient Medications Prior to Visit  Medication Sig  . acetaminophen (TYLENOL) 500 MG tablet Take 1,000 mg by mouth every 6 (six) hours as needed (for pain.).  Marland Kitchen amLODipine (NORVASC) 10 MG tablet TAKE 1 TABLET(10 MG) BY  MOUTH DAILY  . Black Cohosh 40 MG CAPS Take 80 mg by mouth daily after breakfast.  . metFORMIN (GLUCOPHAGE) 500 MG tablet TAKE 1 TABLET(500 MG) BY MOUTH DAILY WITH BREAKFAST  . metoprolol succinate (TOPROL-XL) 50 MG 24 hr tablet TAKE 1 TABLET BY MOUTH DAILY. TAKE WITH OR IMMEDIATELY FOLLOWING A MEAL  . potassium chloride (KLOR-CON) 10 MEQ tablet Take 1 tablet (10 mEq total) by mouth 3 (three) times daily.  . valsartan-hydrochlorothiazide (DIOVAN-HCT) 160-25 MG tablet TAKE 1 TABLET BY MOUTH DAILY   No facility-administered medications prior to visit.    Review of Systems    {Heme  Chem  Endocrine  Serology  Results Review (optional):23779::" "}  Objective    There were no vitals taken for this visit. {Show previous vital signs (optional):23777::" "}  Physical Exam  ***  Last depression screening scores PHQ 2/9 Scores 01/07/2019 12/05/2017 11/25/2016  PHQ - 2 Score 1 0 0  PHQ- 9 Score 4 4 -   Last fall risk screening Fall Risk  04/22/2019  Falls in the past year? 0  Number falls in past yr: 0  Injury with Fall? 0  Follow up -   Last Audit-C alcohol use screening Alcohol Use Disorder Test (AUDIT) 01/07/2019  1. How often do you have a drink containing alcohol? 2  2. How many drinks containing alcohol do you have on a typical day when you are drinking? 0  3. How often do you have six or more drinks on one occasion? 0  AUDIT-C Score 2   A score of 3 or more in women, and 4 or more in men indicates increased risk for alcohol abuse, EXCEPT if all of the points are from question 1   No results found for any visits on 01/28/20.  Assessment & Plan    Routine Health Maintenance and Physical Exam  Exercise Activities and Dietary recommendations Goals   None     Immunization History  Administered Date(s) Administered  . Influenza,inj,Quad PF,6+ Mos 11/29/2013, 01/09/2017, 01/07/2019  . Pneumococcal Polysaccharide-23 08/31/2015  . Tdap 08/18/2015    Health Maintenance   Topic Date Due  . COVID-19 Vaccine (1) Never done  . OPHTHALMOLOGY EXAM  04/14/2018  . INFLUENZA VACCINE  10/17/2019  . HEMOGLOBIN A1C  11/09/2019  . FOOT EXAM  01/07/2020  . MAMMOGRAM  01/20/2021  . PAP SMEAR-Modifier  11/25/2021  . COLONOSCOPY  05/02/2024  . TETANUS/TDAP  08/17/2025  . PNEUMOCOCCAL POLYSACCHARIDE VACCINE AGE 58-64 HIGH RISK  Completed  . Hepatitis C Screening  Completed  . HIV Screening  Completed    Discussed  health benefits of physical activity, and encouraged her to engage in regular exercise appropriate for her age and condition.  ***  No follow-ups on file.     {provider attestation***:1}   Rubye Beach  [facility] [phone] (phone) [phone] (fax)  [place]

## 2020-03-14 NOTE — Progress Notes (Signed)
Complete physical exam   Patient: Madison Ritter   DOB: [birth year]   59 y.o. Female  MRN: [id number] Visit Date: 03/15/2020  Today's healthcare provider: Margaretann Loveless, PA-C   Chief Complaint  Patient presents with  . Annual Exam   Subjective    Madison Ritter is a 59 y.o. female who presents today for a complete physical exam.  She reports consuming a general diet. The patient does not participate in regular exercise at present. She generally feels fairly well. She reports sleeping poorly. She does have additional problems to discuss today. She is having discharge from L breast. Reports is yellowish color.  HPI  11/27/2016 at 9:31 Pap is normal, HPV negative. Will repeat in 3-5 years.  05/02/14-Colonoscopy done. 01/21/19-mammogram and US-Benign-Repeat in 6 months-Patient had breast cyst, left side excision 04/2019.   Past Medical History:  Diagnosis Date  . Hypertension    Past Surgical History:  Procedure Laterality Date  . ABDOMINAL HYSTERECTOMY  2004  . BREAST DUCTAL SYSTEM EXCISION Left 04/14/2019   Procedure: EXCISION DUCTAL SYSTEM BREAST - NO SENTINEL NODE;  Surgeon: Campbell Lerner, MD;  Location: [facility] ORS;  Service: General;  Laterality: Left;  . TUBAL LIGATION     Social History   Socioeconomic History  . Marital status: Married    Spouse name: Not on file  . Number of children: 1  . Years of education: Not on file  . Highest education level: Not on file  Occupational History  . Not on file  Tobacco Use  . Smoking status: Current Every Day Smoker    Packs/day: 0.10    Years: 10.00    Pack years: 1.00    Types: Cigars  . Smokeless tobacco: Never Used  . Tobacco comment: 1 cigar daily  Vaping Use  . Vaping Use: Never used  Substance and Sexual Activity  . Alcohol use: Yes    Alcohol/week: 2.0 standard drinks    Types: 2 Standard drinks or equivalent per week  . Drug use: No  . Sexual activity: Not on file  Other Topics Concern  . Not on  file  Social History Narrative  . Not on file   Social Determinants of Health   Financial Resource Strain: Not on file  Food Insecurity: Not on file  Transportation Needs: Not on file  Physical Activity: Not on file  Stress: Not on file  Social Connections: Not on file  Intimate Partner Violence: Not on file   Family Status  Relation Name Status  . Mother  Alive  . Father  Deceased at age 16  . Sister  Alive  . Brother  Alive  . Brother  Alive  . Mat Aunt  (Not Specified)  . Other gr aunt Alive   Family History  Problem Relation Age of Onset  . Diabetes Mother   . Hypertension Mother   . Congestive Heart Failure Mother   . Sleep apnea Mother   . Stroke Mother   . Hypertension Father   . Head & neck cancer Father   . Thyroid disease Sister   . Hypertension Brother   . Stroke Brother   . Hypertension Brother   . Breast cancer Maternal Aunt 26  . Breast cancer Other    Allergies  Allergen Reactions  . Sulfa Antibiotics Hives    Patient Care Team: Reine Just as PCP - General (Family Medicine)   Medications: Outpatient Medications Prior to Visit  Medication Sig  . acetaminophen (  TYLENOL) 500 MG tablet Take 1,000 mg by mouth every 6 (six) hours as needed (for pain.).  Marland Kitchen amLODipine (NORVASC) 10 MG tablet TAKE 1 TABLET(10 MG) BY MOUTH DAILY  . Black Cohosh 40 MG CAPS Take 80 mg by mouth daily after breakfast.  . metFORMIN (GLUCOPHAGE) 500 MG tablet TAKE 1 TABLET(500 MG) BY MOUTH DAILY WITH BREAKFAST  . metoprolol succinate (TOPROL-XL) 50 MG 24 hr tablet TAKE 1 TABLET BY MOUTH DAILY. TAKE WITH OR IMMEDIATELY FOLLOWING A MEAL  . valsartan-hydrochlorothiazide (DIOVAN-HCT) 160-25 MG tablet TAKE 1 TABLET BY MOUTH DAILY  . potassium chloride (KLOR-CON) 10 MEQ tablet Take 1 tablet (10 mEq total) by mouth 3 (three) times daily.   No facility-administered medications prior to visit.    Review of Systems  Constitutional: Negative.   HENT: Negative.    Eyes: Negative.   Respiratory: Negative.   Cardiovascular: Negative.   Gastrointestinal: Negative.   Endocrine: Negative.   Genitourinary: Positive for genital sores.  Musculoskeletal: Negative.   Skin: Negative.   Allergic/Immunologic: Negative.   Neurological: Negative.   Hematological: Negative.   Psychiatric/Behavioral: Negative.    Last CBC Lab Results  Component Value Date   WBC 10.2 03/15/2020   HGB 13.9 03/15/2020   HCT 40.2 03/15/2020   MCV 90 03/15/2020   MCH 31.0 03/15/2020   RDW 12.4 03/15/2020   PLT 385 03/15/2020   Last metabolic panel Lab Results  Component Value Date   GLUCOSE 192 (H) 03/15/2020   NA 139 03/15/2020   K 3.7 03/15/2020   CL 101 03/15/2020   CO2 24 03/15/2020   BUN 12 03/15/2020   CREATININE 0.77 03/15/2020   GFRNONAA 86 03/15/2020   GFRAA 99 03/15/2020   CALCIUM 9.6 03/15/2020   PROT 6.7 03/15/2020   ALBUMIN 4.2 03/15/2020   LABGLOB 2.5 03/15/2020   AGRATIO 1.7 03/15/2020   BILITOT 0.3 03/15/2020   ALKPHOS 121 03/15/2020   AST 19 03/15/2020   ALT 24 03/15/2020   ANIONGAP 10 04/12/2019        Objective    BP (!) 161/82 (BP Location: Left Arm, Patient Position: Sitting, Cuff Size: Large)   Pulse 77   Temp 98.6 F (37 C) (Oral)   Resp 16   Ht 5\' 4"  (1.626 m)   Wt 196 lb 6.4 oz (89.1 kg)   BMI 33.71 kg/m  BP Readings from Last 3 Encounters:  03/15/20 (!) 161/82  08/09/19 (!) 145/94  05/10/19 128/86   Wt Readings from Last 3 Encounters:  03/15/20 196 lb 6.4 oz (89.1 kg)  08/09/19 196 lb 9.6 oz (89.2 kg)  05/10/19 194 lb (88 kg)      Physical Exam Vitals reviewed.  Constitutional:      General: She is not in acute distress.    Appearance: Normal appearance. She is well-developed and well-nourished. She is obese. She is not ill-appearing or diaphoretic.  HENT:     Head: Normocephalic and atraumatic.     Right Ear: Hearing, tympanic membrane, ear canal and external ear normal.     Left Ear: Hearing, tympanic  membrane, ear canal and external ear normal.     Nose: Nose normal.     Mouth/Throat:     Mouth: Oropharynx is clear and moist and mucous membranes are normal. Mucous membranes are moist.     Pharynx: Oropharynx is clear. Uvula midline. No oropharyngeal exudate.  Eyes:     General: No scleral icterus.       Right eye: No discharge.  Left eye: No discharge.     Extraocular Movements: Extraocular movements intact and EOM normal.     Conjunctiva/sclera: Conjunctivae normal.     Pupils: Pupils are equal, round, and reactive to light.  Neck:     Thyroid: No thyromegaly.     Vascular: No carotid bruit or JVD.     Trachea: No tracheal deviation.  Cardiovascular:     Rate and Rhythm: Normal rate and regular rhythm.     Pulses: Normal pulses and intact distal pulses.     Heart sounds: Normal heart sounds. No murmur heard. No friction rub. No gallop.   Pulmonary:     Effort: Pulmonary effort is normal. No respiratory distress.     Breath sounds: Normal breath sounds. No wheezing or rales.  Chest:     Chest wall: No tenderness.  Breasts: No discharge from either breast. No tenderness and bleeding. Breasts are symmetrical.     Right: No inverted nipple, mass, nipple discharge, skin change or tenderness.     Left: Nipple discharge present. No inverted nipple, mass, skin change or tenderness.     Abdominal:     General: Abdomen is flat. Bowel sounds are normal. There is no distension.     Palpations: Abdomen is soft. There is no mass.     Tenderness: There is no abdominal tenderness. There is no guarding or rebound.     Hernia: There is no hernia in the right inguinal area, left inguinal area or right inguinal area.  Genitourinary:    General: Normal vulva.     Exam position: Supine.     Labia:        Right: No rash, tenderness, lesion or injury.        Left: No rash, tenderness, lesion or injury.      Vagina: Normal. No signs of injury. No vaginal discharge, erythema, tenderness  or bleeding.     Uterus: Absent, normal.      Adnexa:        Right: No mass, tenderness or fullness.         Left: No mass, tenderness or fullness.       Rectum: Normal.  Musculoskeletal:        General: No tenderness or edema. Normal range of motion.     Cervical back: Normal range of motion and neck supple. No tenderness.     Right lower leg: No edema.     Left lower leg: No edema.  Lymphadenopathy:     Cervical: No cervical adenopathy.     Lower Body: No right inguinal and no right inguinal adenopathy. No left inguinal and no left inguinal adenopathy.  Skin:    General: Skin is warm and dry.     Capillary Refill: Capillary refill takes less than 2 seconds.     Findings: No rash.  Neurological:     General: No focal deficit present.     Mental Status: She is alert and oriented to person, place, and time. Mental status is at baseline.     Cranial Nerves: No cranial nerve deficit.     Coordination: Coordination normal.     Deep Tendon Reflexes: Reflexes are normal and symmetric.  Psychiatric:        Mood and Affect: Mood and affect and mood normal.        Behavior: Behavior normal.        Thought Content: Thought content normal.        Judgment: Judgment normal.  Last depression screening scores PHQ 2/9 Scores 03/15/2020 01/07/2019 12/05/2017  PHQ - 2 Score 1 1 0  PHQ- 9 Score - 4 4   Last fall risk screening Fall Risk  03/15/2020  Falls in the past year? 1  Number falls in past yr: 1  Comment 1  Injury with Fall? 1  Follow up -   Last Audit-C alcohol use screening Alcohol Use Disorder Test (AUDIT) 03/15/2020  1. How often do you have a drink containing alcohol? 2  2. How many drinks containing alcohol do you have on a typical day when you are drinking? 0  3. How often do you have six or more drinks on one occasion? 0  AUDIT-C Score 2  Alcohol Brief Interventions/Follow-up AUDIT Score <7 follow-up not indicated   A score of 3 or more in women, and 4 or more in  men indicates increased risk for alcohol abuse, EXCEPT if all of the points are from question 1   No results found for any visits on 03/15/20.  Assessment & Plan    Routine Health Maintenance and Physical Exam  Exercise Activities and Dietary recommendations Goals   None     Immunization History  Administered Date(s) Administered  . Influenza,inj,Quad PF,6+ Mos 11/29/2013, 01/09/2017, 01/07/2019  . Pneumococcal Polysaccharide-23 08/31/2015  . Tdap 08/18/2015    Health Maintenance  Topic Date Due  . COVID-19 Vaccine (1) Never done  . OPHTHALMOLOGY EXAM  04/14/2018  . INFLUENZA VACCINE  10/17/2019  . HEMOGLOBIN A1C  11/09/2019  . FOOT EXAM  01/07/2020  . MAMMOGRAM  01/20/2021  . PAP SMEAR-Modifier  11/25/2021  . COLONOSCOPY (Pts 45-11yrs Insurance coverage will need to be confirmed)  05/02/2024  . TETANUS/TDAP  08/17/2025  . PNEUMOCOCCAL POLYSACCHARIDE VACCINE AGE 39-64 HIGH RISK  Completed  . Hepatitis C Screening  Completed  . HIV Screening  Completed    Discussed health benefits of physical activity, and encouraged her to engage in regular exercise appropriate for her age and condition.  1. Annual physical exam Normal physical exam today. Will check labs as below and f/u pending lab results. If labs are stable and WNL she will not need to have these rechecked for one year at her next annual physical exam. She is to call the office in the meantime if she has any acute issue, questions or concerns. - CBC with Differential/Platelet - Comprehensive metabolic panel - Lipid panel - Hemoglobin A1c - TSH  2. Encounter for breast cancer screening using non-mammogram modality Was to have repeat imaging in 6 months, but was not done. Has had left nipple cyst surgically resected by Dr. Claudine Mouton at the end of January 2021. Now having nipple discharge. Will order diagnostic and [place] as below.  - MM DIAG BREAST TOMO BILATERAL; Future - US BREAST LTD UNI LEFT INC AXILLA; Future  3.  Discharge from left nipple See above medical treatment plan. Will check labs as below and f/u pending results. - Prolactin - MM DIAG BREAST TOMO BILATERAL; Future - US BREAST LTD UNI LEFT INC AXILLA; Future  4. Screening for vaginal cancer Pap collected today. Will send as below and f/u pending results. - Cytology - PAP  5. Essential (primary) hypertension Stable. Continue amlodipine , metoprolol xr , Valsartan-HCTZ 16-25mg . Will check labs as below and f/u pending results. - CBC with Differential/Platelet - Comprehensive metabolic panel - Lipid panel - Hemoglobin A1c - TSH  6. Type 2 diabetes mellitus without complication, without long-term current use of insulin (HCC)  Stable. Continue Metformin 500mg  daily. Will check labs as below and f/u pending results. - CBC with Differential/Platelet - Comprehensive metabolic panel - Lipid panel - Hemoglobin A1c  7. Class 1 obesity due to excess calories without serious comorbidity with body mass index (BMI) of 33.0 to 33.9 in adult Counseled patient on healthy lifestyle modifications including dieting and exercise.  Will check labs as below and f/u pending results. - CBC with Differential/Platelet - Comprehensive metabolic panel - Lipid panel - Hemoglobin A1c - TSH  8. Vitamin D deficiency H/O this and postmenopausal. Will check labs as below and f/u pending results. - CBC with Differential/Platelet - Vitamin D (25 hydroxy)  9. Need for influenza vaccination Flu vaccine given today without complication. Patient sat upright for 15 minutes to check for adverse reaction before being released. - Flu Vaccine QUAD 36+ mos IM   No follow-ups on file.     , PA-C, have reviewed all documentation for this visit. The documentation on 03/20/20 for the exam, diagnosis, procedures, and orders are all accurate and complete.   05/18/20  [facility] [phone]  (phone) [phone] (fax)  [facility]

## 2020-03-15 ENCOUNTER — Telehealth: Payer: Self-pay | Admitting: Physician Assistant

## 2020-03-15 ENCOUNTER — Ambulatory Visit (INDEPENDENT_AMBULATORY_CARE_PROVIDER_SITE_OTHER): Payer: BC Managed Care – PPO | Admitting: Physician Assistant

## 2020-03-15 ENCOUNTER — Other Ambulatory Visit (HOSPITAL_COMMUNITY)
Admission: RE | Admit: 2020-03-15 | Discharge: 2020-03-15 | Disposition: A | Discharge status: Home / Self Care | Payer: BC Managed Care – PPO | Source: Ambulatory Visit | Attending: Physician Assistant | Admitting: Physician Assistant

## 2020-03-15 ENCOUNTER — Other Ambulatory Visit: Payer: Self-pay

## 2020-03-15 ENCOUNTER — Encounter: Payer: Self-pay | Admitting: Physician Assistant

## 2020-03-15 VITALS — BP 161/82 | HR 77 | Temp 98.6°F | Resp 16 | Ht 64.0 in | Wt 196.4 lb

## 2020-03-15 DIAGNOSIS — E119 Type 2 diabetes mellitus without complications: Secondary | ICD-10-CM

## 2020-03-15 DIAGNOSIS — Z23 Encounter for immunization: Secondary | ICD-10-CM | POA: Diagnosis not present

## 2020-03-15 DIAGNOSIS — N6452 Nipple discharge: Secondary | ICD-10-CM

## 2020-03-15 DIAGNOSIS — Z6833 Body mass index (BMI) 33.0-33.9, adult: Secondary | ICD-10-CM

## 2020-03-15 DIAGNOSIS — E6609 Other obesity due to excess calories: Secondary | ICD-10-CM

## 2020-03-15 DIAGNOSIS — I10 Essential (primary) hypertension: Secondary | ICD-10-CM

## 2020-03-15 DIAGNOSIS — Z1239 Encounter for other screening for malignant neoplasm of breast: Secondary | ICD-10-CM | POA: Diagnosis not present

## 2020-03-15 DIAGNOSIS — E559 Vitamin D deficiency, unspecified: Secondary | ICD-10-CM

## 2020-03-15 DIAGNOSIS — Z0000 Encounter for general adult medical examination without abnormal findings: Secondary | ICD-10-CM | POA: Diagnosis not present

## 2020-03-15 DIAGNOSIS — Z124 Encounter for screening for malignant neoplasm of cervix: Secondary | ICD-10-CM | POA: Diagnosis not present

## 2020-03-15 DIAGNOSIS — Z1272 Encounter for screening for malignant neoplasm of vagina: Secondary | ICD-10-CM

## 2020-03-15 NOTE — Patient Instructions (Signed)

## 2020-03-16 ENCOUNTER — Telehealth: Payer: Self-pay | Admitting: Physician Assistant

## 2020-03-16 LAB — COMPREHENSIVE METABOLIC PANEL
ALT: 24 IU/L (ref 0–32)
AST: 19 IU/L (ref 0–40)
Albumin/Globulin Ratio: 1.7 (ref 1.2–2.2)
Albumin: 4.2 g/dL (ref 3.8–4.9)
Alkaline Phosphatase: 121 IU/L (ref 44–121)
BUN/Creatinine Ratio: 16 (ref 9–23)
BUN: 12 mg/dL (ref 6–24)
Bilirubin Total: 0.3 mg/dL (ref 0.0–1.2)
CO2: 24 mmol/L (ref 20–29)
Calcium: 9.6 mg/dL (ref 8.7–10.2)
Chloride: 101 mmol/L (ref 96–106)
Creatinine, Ser: 0.77 mg/dL (ref 0.57–1.00)
GFR calc Af Amer: 99 mL/min/{1.73_m2} (ref >59)
GFR calc non Af Amer: 86 mL/min/{1.73_m2} (ref >59)
Globulin, Total: 2.5 g/dL (ref 1.5–4.5)
Glucose: 192 mg/dL — ABNORMAL HIGH (ref 65–99)
Potassium: 3.7 mmol/L (ref 3.5–5.2)
Sodium: 139 mmol/L (ref 134–144)
Total Protein: 6.7 g/dL (ref 6.0–8.5)

## 2020-03-16 LAB — CBC WITH DIFFERENTIAL/PLATELET
Basophils Absolute: 0.1 10*3/uL (ref 0.0–0.2)
Basos: 1 %
EOS (ABSOLUTE): 0.2 10*3/uL (ref 0.0–0.4)
Eos: 2 %
Hematocrit: 40.2 % (ref 34.0–46.6)
Hemoglobin: 13.9 g/dL (ref 11.1–15.9)
Immature Grans (Abs): 0 10*3/uL (ref 0.0–0.1)
Immature Granulocytes: 0 %
Lymphocytes Absolute: 3.3 10*3/uL — ABNORMAL HIGH (ref 0.7–3.1)
Lymphs: 32 %
MCH: 31 pg (ref 26.6–33.0)
MCHC: 34.6 g/dL (ref 31.5–35.7)
MCV: 90 fL (ref 79–97)
Monocytes Absolute: 0.7 10*3/uL (ref 0.1–0.9)
Monocytes: 7 %
Neutrophils Absolute: 5.9 10*3/uL (ref 1.4–7.0)
Neutrophils: 58 %
Platelets: 385 10*3/uL (ref 150–450)
RBC: 4.48 x10E6/uL (ref 3.77–5.28)
RDW: 12.4 % (ref 11.7–15.4)
WBC: 10.2 10*3/uL (ref 3.4–10.8)

## 2020-03-16 LAB — HEMOGLOBIN A1C
Est. average glucose Bld gHb Est-mCnc: 183 mg/dL
Hgb A1c MFr Bld: 8 % — ABNORMAL HIGH (ref 4.8–5.6)

## 2020-03-16 LAB — LIPID PANEL
Chol/HDL Ratio: 3.8 ratio (ref 0.0–4.4)
Cholesterol, Total: 230 mg/dL — ABNORMAL HIGH (ref 100–199)
HDL: 61 mg/dL (ref >39)
LDL Chol Calc (NIH): 139 mg/dL — ABNORMAL HIGH (ref 0–99)
Triglycerides: 167 mg/dL — ABNORMAL HIGH (ref 0–149)
VLDL Cholesterol Cal: 30 mg/dL (ref 5–40)

## 2020-03-16 LAB — VITAMIN D 25 HYDROXY (VIT D DEFICIENCY, FRACTURES): Vit D, 25-Hydroxy: 9.6 ng/mL — ABNORMAL LOW (ref 30.0–100.0)

## 2020-03-16 LAB — PROLACTIN: Prolactin: 5.8 ng/mL (ref 4.8–23.3)

## 2020-03-16 LAB — TSH: TSH: 0.443 u[IU]/mL — ABNORMAL LOW (ref 0.450–4.500)

## 2020-03-16 NOTE — Telephone Encounter (Signed)
Pt states that a topical cream was supposed to be called into [facility] in [place] for vaginal irritation. She spoke to you about this at OV 03/15/20,Thanks

## 2020-03-17 ENCOUNTER — Other Ambulatory Visit: Payer: Self-pay | Admitting: Physician Assistant

## 2020-03-17 DIAGNOSIS — E559 Vitamin D deficiency, unspecified: Secondary | ICD-10-CM

## 2020-03-17 MED ORDER — VITAMIN D (ERGOCALCIFEROL) 1.25 MG (50000 UNIT) PO CAPS: 50000.0000 [IU] | ORAL_CAPSULE | ORAL | 3 refills | Status: DC

## 2020-03-19 LAB — CYTOLOGY - PAP
Comment: NEGATIVE
Diagnosis: NEGATIVE
High risk HPV: NEGATIVE

## 2020-03-21 ENCOUNTER — Other Ambulatory Visit: Payer: Self-pay | Admitting: Physician Assistant

## 2020-03-21 DIAGNOSIS — E7800 Pure hypercholesterolemia, unspecified: Secondary | ICD-10-CM

## 2020-03-21 MED ORDER — ATORVASTATIN CALCIUM 20 MG PO TABS: 20.0000 mg | ORAL_TABLET | Freq: Every day | ORAL | 3 refills | Status: DC

## 2020-03-21 MED ORDER — ESTRADIOL 0.1 MG/GM VA CREA: 1.0000 | TOPICAL_CREAM | Freq: Every day | VAGINAL | 12 refills | Status: DC

## 2020-03-21 NOTE — Telephone Encounter (Signed)
Estrace sent in.  I apologize

## 2020-03-30 ENCOUNTER — Ambulatory Visit
Admission: RE | Admit: 2020-03-30 | Discharge: 2020-03-30 | Disposition: A | Discharge status: Home / Self Care | Payer: BC Managed Care – PPO | Source: Ambulatory Visit | Attending: Physician Assistant | Admitting: Physician Assistant

## 2020-03-30 ENCOUNTER — Other Ambulatory Visit: Payer: Self-pay

## 2020-03-30 DIAGNOSIS — Z1239 Encounter for other screening for malignant neoplasm of breast: Secondary | ICD-10-CM | POA: Insufficient documentation

## 2020-03-30 DIAGNOSIS — R922 Inconclusive mammogram: Secondary | ICD-10-CM | POA: Diagnosis not present

## 2020-03-30 DIAGNOSIS — N6452 Nipple discharge: Secondary | ICD-10-CM

## 2020-03-31 ENCOUNTER — Other Ambulatory Visit: Payer: Self-pay | Admitting: Physician Assistant

## 2020-03-31 MED ORDER — ESTRADIOL 0.1 MG/GM VA CREA
1.0000 | TOPICAL_CREAM | VAGINAL | 12 refills | Status: DC
Start: 2020-03-31 — End: 2021-04-12

## 2020-06-14 ENCOUNTER — Other Ambulatory Visit: Payer: Self-pay

## 2020-06-14 ENCOUNTER — Encounter: Payer: Self-pay | Admitting: Physician Assistant

## 2020-06-14 ENCOUNTER — Ambulatory Visit (INDEPENDENT_AMBULATORY_CARE_PROVIDER_SITE_OTHER): Payer: BC Managed Care – PPO | Admitting: Physician Assistant

## 2020-06-14 VITALS — BP 153/102 | HR 66 | Temp 98.4°F | Wt 194.0 lb

## 2020-06-14 DIAGNOSIS — I10 Essential (primary) hypertension: Secondary | ICD-10-CM | POA: Diagnosis not present

## 2020-06-14 DIAGNOSIS — E7800 Pure hypercholesterolemia, unspecified: Secondary | ICD-10-CM

## 2020-06-14 DIAGNOSIS — E119 Type 2 diabetes mellitus without complications: Secondary | ICD-10-CM | POA: Diagnosis not present

## 2020-06-14 LAB — POCT GLYCOSYLATED HEMOGLOBIN (HGB A1C): Hemoglobin A1C: 8.4 % — AB (ref 4.0–5.6)

## 2020-06-14 MED ORDER — VALSARTAN-HYDROCHLOROTHIAZIDE 160-25 MG PO TABS
1.0000 | ORAL_TABLET | Freq: Every day | ORAL | 3 refills | Status: DC
Start: 2020-06-14 — End: 2021-04-10

## 2020-06-14 MED ORDER — AMLODIPINE BESYLATE 10 MG PO TABS: ORAL_TABLET | ORAL | 3 refills | Status: DC

## 2020-06-14 MED ORDER — METFORMIN HCL 500 MG PO TABS: 1000.0000 mg | ORAL_TABLET | Freq: Every day | ORAL | 1 refills | Status: DC

## 2020-06-14 NOTE — Progress Notes (Signed)
Established patient visit   Patient: Madison Ritter   DOB: [birth year]   60 y.o. Female  MRN: [id number] Visit Date: 06/14/2020  Today's healthcare provider: Margaretann Loveless, PA-C   Chief Complaint  Patient presents with  . Diabetes  . Hypertension  . Hyperlipidemia   Subjective    HPI  Diabetes Mellitus Type II, follow-up  Lab Results  Component Value Date   HGBA1C 8.4 (A) 06/14/2020   HGBA1C 8.0 (H) 03/15/2020   HGBA1C 7.4 (A) 08/09/2019   Last seen for diabetes 3 months ago.  Management since then includes increasing Metformin to 500mg  BID She reports excellent compliance with treatment. She is not having side effects.   Home blood sugar records: are not being checked.  Episodes of hypoglycemia? Yes Occasionally when she doesn't eat.    Current insulin regiment: none  Most Recent Eye Exam: Pt is due for an eye exam.   --------------------------------------------------------------------------------------------------- Hypertension, follow-up  BP Readings from Last 3 Encounters:  06/14/20 (!) 153/102  03/15/20 (!) 161/82  08/09/19 (!) 145/94   Wt Readings from Last 3 Encounters:  06/14/20 194 lb (88 kg)  03/15/20 196 lb 6.4 oz (89.1 kg)  08/09/19 196 lb 9.6 oz (89.2 kg)     She was last seen for hypertension 3 months ago.  Management since that visit includes no changes. She reports excellent compliance with treatment. She is not having side effects.  She is not exercising. She is adherent to low salt diet.   Outside blood pressures are not being checked at home.  She does smoke.  Use of agents associated with hypertension: none.   --------------------------------------------------------------------------------------------------- Lipid/Cholesterol, follow-up  Last Lipid Panel: Lab Results  Component Value Date   CHOL 230 (H) 03/15/2020   LDLCALC 139 (H) 03/15/2020   HDL 61 03/15/2020   TRIG 167 (H) 03/15/2020    She was last seen for  this 3 months ago.  Management since that visit includes starting atrovastatin.  She reports excellent compliance with treatment. She is not having side effects.   Symptoms: No appetite changes No foot ulcerations  No chest pain No chest pressure/discomfort  No dyspnea No orthopnea  No fatigue No lower extremity edema  No palpitations No paroxysmal nocturnal dyspnea  No nausea No numbness or tingling of extremity  No polydipsia No polyuria  No speech difficulty No syncope   She is following a Regular diet.   Last metabolic panel Lab Results  Component Value Date   GLUCOSE 192 (H) 03/15/2020   NA 139 03/15/2020   K 3.7 03/15/2020   BUN 12 03/15/2020   CREATININE 0.77 03/15/2020   GFRNONAA 86 03/15/2020   GFRAA 99 03/15/2020   CALCIUM 9.6 03/15/2020   AST 19 03/15/2020   ALT 24 03/15/2020   The 10-year ASCVD risk score 03/17/2020 DC Jr., et al., 2013) is: 42.4%  ---------------------------------------------------------------------------------------------------   Patient Active Problem List   Diagnosis Date Noted  . Hematuria 08/18/2015  . Obese 01/11/2015  . Diabetes mellitus type 2, uncomplicated (HCC) 01/11/2015  . Hypercholesteremia 01/11/2015  . Vitamin D deficiency 01/11/2015  . Carpal tunnel syndrome 04/22/2009  . Current tobacco use 02/13/2009  . Essential (primary) hypertension 03/18/1998   Past Medical History:  Diagnosis Date  . Hypertension    Social History   Tobacco Use  . Smoking status: Current Every Day Smoker    Packs/day: 0.10    Years: 10.00    Pack years: 1.00    Types: Cigars  .  Smokeless tobacco: Never Used  . Tobacco comment: 1 cigar daily  Vaping Use  . Vaping Use: Never used  Substance Use Topics  . Alcohol use: Yes    Alcohol/week: 2.0 standard drinks    Types: 2 Standard drinks or equivalent per week  . Drug use: No   Allergies  Allergen Reactions  . Sulfa Antibiotics Hives     Medications: Outpatient Medications Prior  to Visit  Medication Sig  . acetaminophen (TYLENOL) 500 MG tablet Take 1,000 mg by mouth every 6 (six) hours as needed (for pain.).  Marland Kitchen amLODipine (NORVASC) 10 MG tablet TAKE 1 TABLET(10 MG) BY MOUTH DAILY  . atorvastatin (LIPITOR) 20 MG tablet Take 1 tablet (20 mg total) by mouth daily.  Marland Kitchen estradiol (ESTRACE VAGINAL) 0.1 MG/GM vaginal cream Place 1 Applicatorful vaginally 3 (three) times a week.  . metFORMIN (GLUCOPHAGE) 500 MG tablet TAKE 1 TABLET(500 MG) BY MOUTH DAILY WITH BREAKFAST  . metoprolol succinate (TOPROL-XL) 50 MG 24 hr tablet TAKE 1 TABLET BY MOUTH DAILY. TAKE WITH OR IMMEDIATELY FOLLOWING A MEAL  . valsartan-hydrochlorothiazide (DIOVAN-HCT) 160-25 MG tablet TAKE 1 TABLET BY MOUTH DAILY  . Vitamin D, Ergocalciferol, (DRISDOL) 1.25 MG (50000 UNIT) CAPS capsule Take 1 capsule (50,000 Units total) by mouth every 7 (seven) days.  . Black Cohosh 40 MG CAPS Take 80 mg by mouth daily after breakfast. (Patient not taking: Reported on 06/14/2020)   No facility-administered medications prior to visit.    Review of Systems  Constitutional: Negative.   Respiratory: Negative.   Cardiovascular: Negative.   Gastrointestinal: Negative.   Neurological: Negative for dizziness, light-headedness and headaches.        Objective    BP (!) 153/102 (BP Location: Right Arm, Patient Position: Sitting, Cuff Size: Large)   Pulse 66   Temp 98.4 F (36.9 C) (Oral)   Wt 194 lb (88 kg)   BMI 33.30 kg/m    Physical Exam Vitals reviewed.  Constitutional:      General: She is not in acute distress.    Appearance: Normal appearance. She is well-developed. She is obese. She is not ill-appearing or diaphoretic.  Neck:     Vascular: No JVD.  Cardiovascular:     Rate and Rhythm: Normal rate and regular rhythm.     Pulses: Normal pulses.     Heart sounds: Normal heart sounds. No murmur heard. No friction rub. No gallop.   Pulmonary:     Effort: Pulmonary effort is normal. No respiratory  distress.     Breath sounds: Normal breath sounds. No wheezing or rales.  Musculoskeletal:     Cervical back: Normal range of motion and neck supple.  Neurological:     Mental Status: She is alert.     Results for orders placed or performed in visit on 06/14/20  POCT glycosylated hemoglobin (Hb A1C)  Result Value Ref Range   Hemoglobin A1C 8.4 (A) 4.0 - 5.6 %    Assessment & Plan     1. Essential (primary) hypertension  Stable. Diagnosis pulled for medication refill. Continue current medical treatment plan. - amLODipine (NORVASC) 10 MG tablet; TAKE 1 TABLET(10 MG) BY MOUTH DAILY  Dispense: 90 tablet; Refill: 3 - valsartan-hydrochlorothiazide (DIOVAN-HCT) 160-25 MG tablet; Take 1 tablet by mouth daily.  Dispense: 90 tablet; Refill: 3  2. Hypercholesteremia Stable continue current medical treatment plan.   3. Type 2 diabetes mellitus without complication, without long-term current use of insulin (HCC) A1c increased to 8.4. Increase metformin to 1000mg   BID. F/U in 3 months.  - POCT glycosylated hemoglobin (Hb A1C) - metFORMIN (GLUCOPHAGE) 500 MG tablet; Take 2 tablets (1,000 mg total) by mouth daily with breakfast.  Dispense: 180 tablet; Refill: 1    No follow-ups on file.      Delmer Islam, PA-C, have reviewed all documentation for this visit. The documentation on 06/25/20 for the exam, diagnosis, procedures, and orders are all accurate and complete.   Reine Just  [facility] [phone] (phone) [phone] (fax)  [facility]

## 2020-06-25 ENCOUNTER — Encounter: Payer: Self-pay | Admitting: Physician Assistant

## 2020-07-11 IMAGING — MG DIGITAL DIAGNOSTIC BILAT W/ TOMO W/ CAD
6 of 10 series · 6 of 30 positions shown · non-contrast
Comparison: Previous exam(s).

ACR Breast Density Category a: The breast tissue is almost entirely
fatty.

CLINICAL DATA: 58-year-old female presenting with complaint of a
small bump on the left nipple. She was seen previously in Saturday December, 2017 for the same issue, which at that time resolved on its own. No
abnormalities were identified on that exam.

EXAM:
DIGITAL DIAGNOSTIC BILATERAL MAMMOGRAM WITH CAD AND TOMO
ULTRASOUND LEFT BREAST

[R MLO synth-2D]
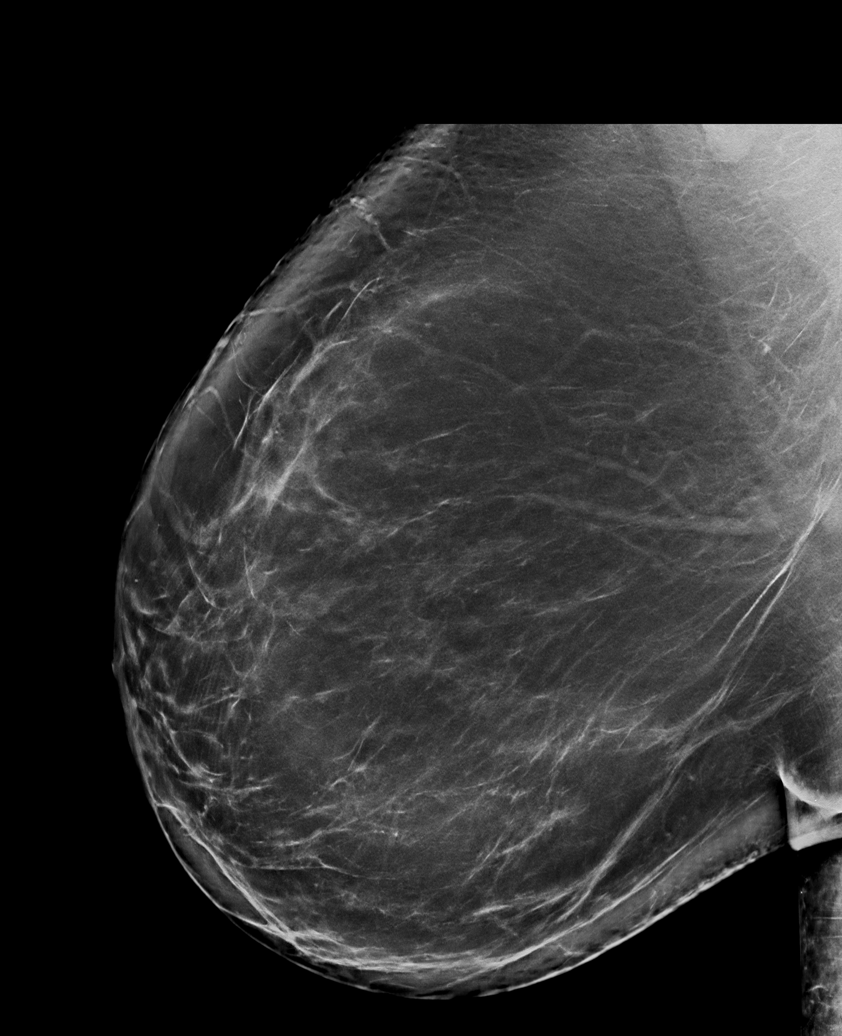

[L CC synth-2D (1 of 2)]
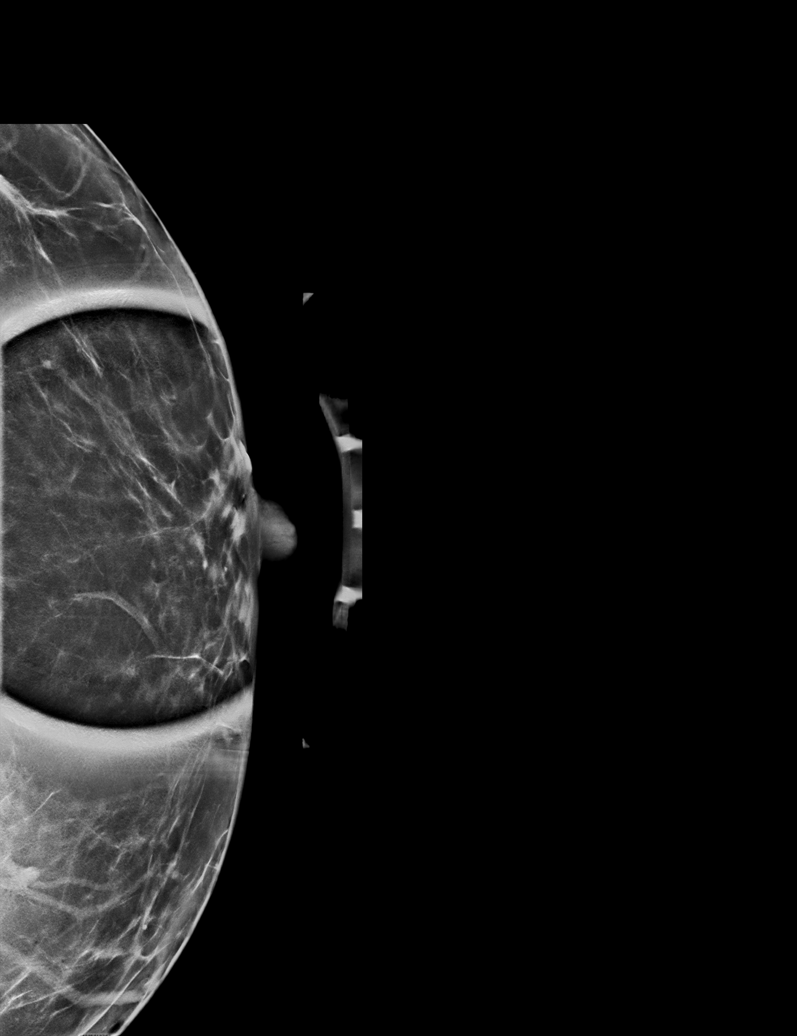

[L MLO synth-2D]
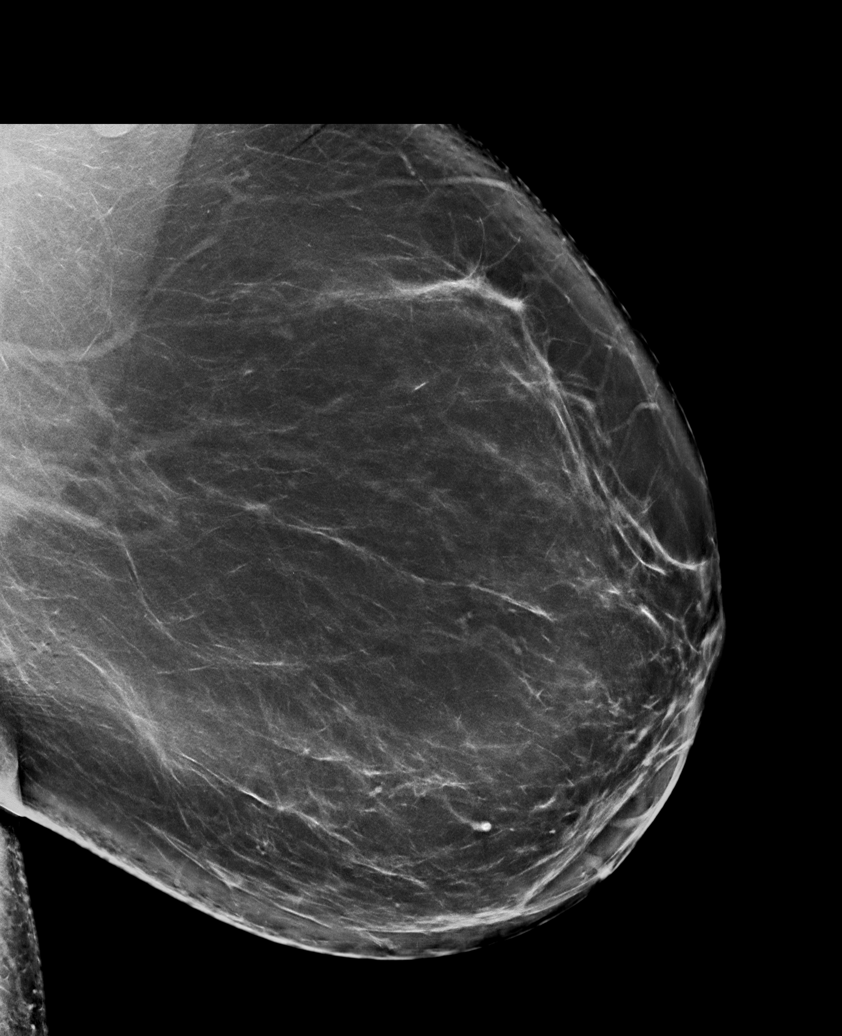

[L CC synth-2D (2 of 2)]
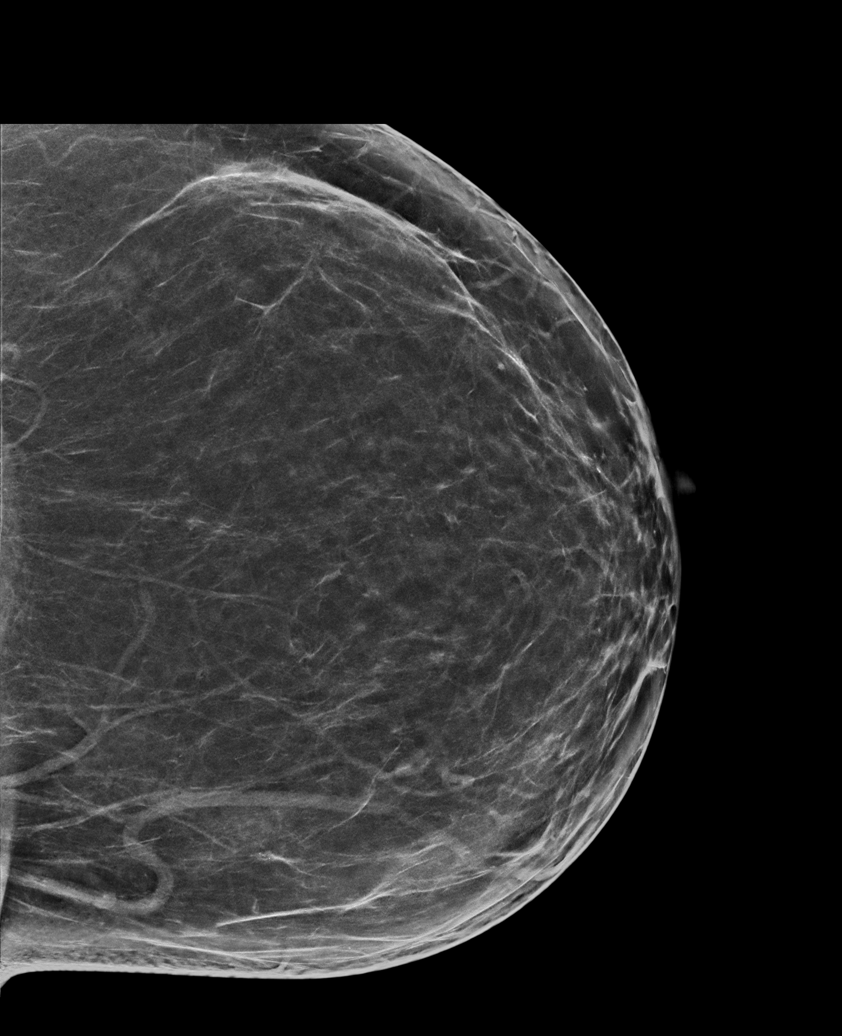

[R CC synth-2D]
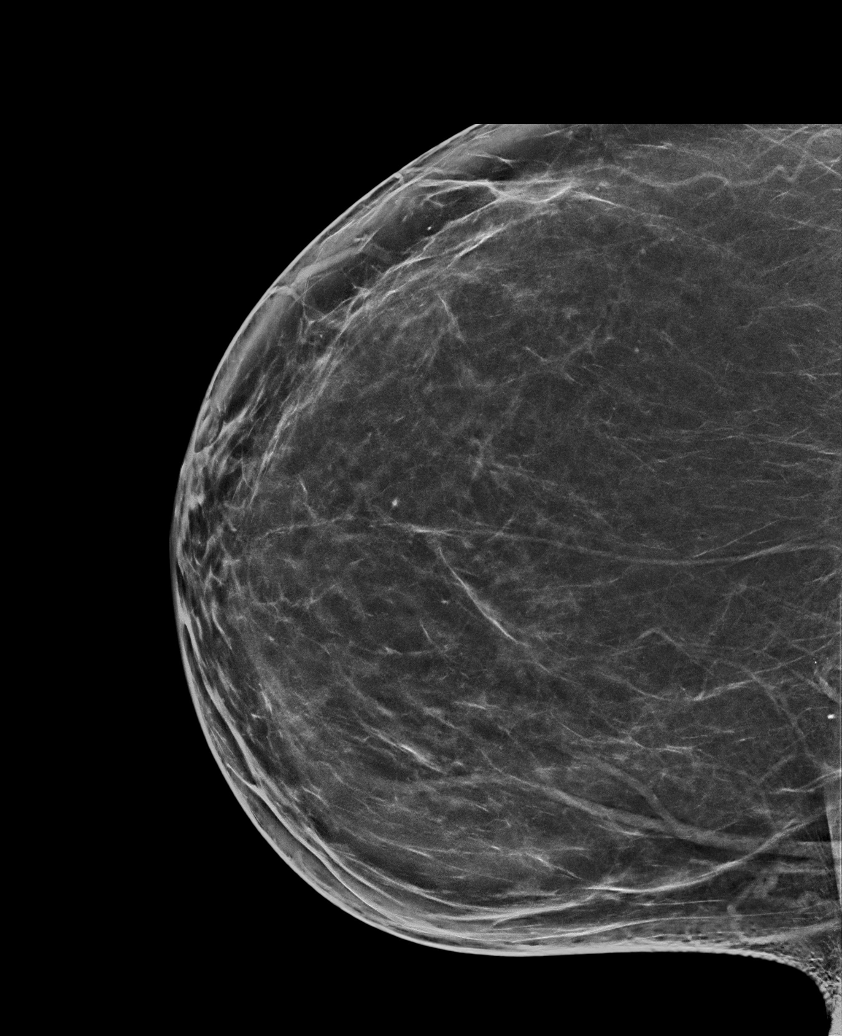

[R MLO tomo · tomo slice 56/111.0]
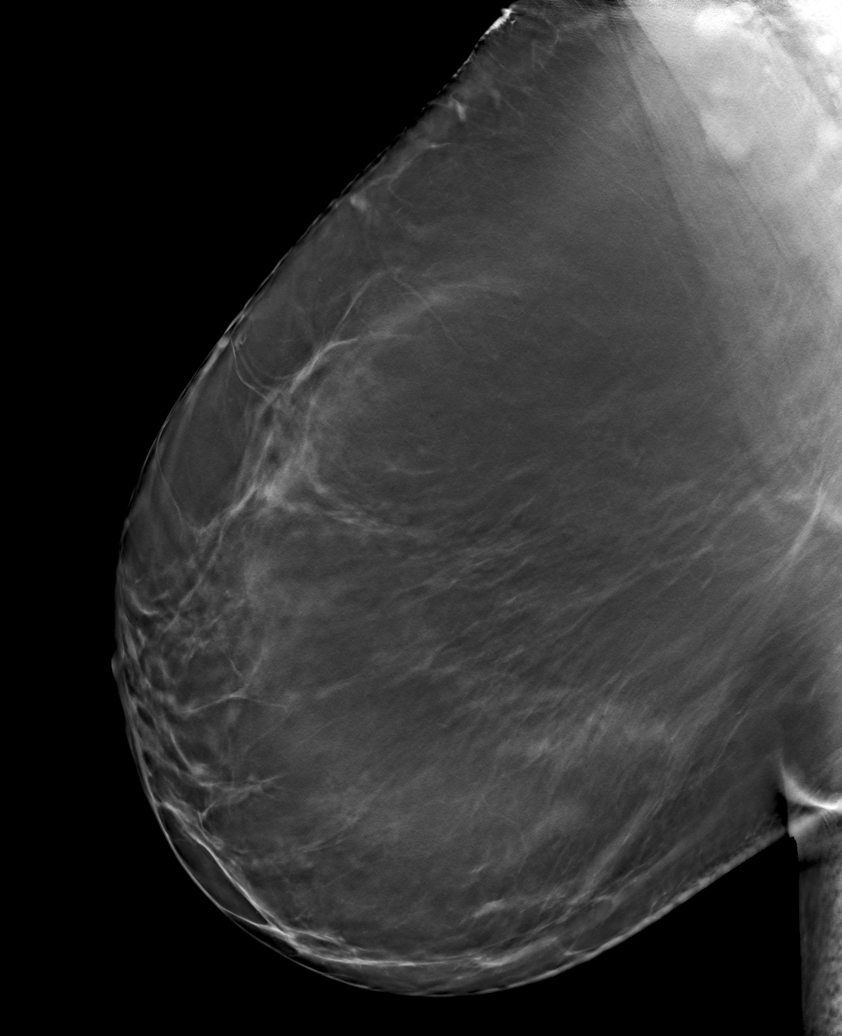

[6 of 30 positions shown; findings below may reference images not displayed]

FINDINGS: No suspicious calcifications, masses or areas of distortion are seen
in the bilateral breasts.

Mammographic images were processed with CAD.

On physical exam, the left nipple is asymmetrically enlarged as
compared to the right. The area of fullness is primarily in the
lower-inner aspect of the nipple, and the overlying skin has
smoothed. There is no rash or discoloration of the skin of the
nipple or areola.

Targeted ultrasound is performed, showing a hypoechoic area within
the nipple measuring approximately 1.2 cm. There is no blood flow
within the hypoechoic region on color Doppler imaging.
IMPRESSION: 1. There is a hypoechoic area with in the lower-inner aspect of the
left nipple, possibly a clogged duct with debris. Given that the
patient has had history of this problem previously which resolved on
its own, this is likely the same process.

2.  No mammographic evidence of malignancy in the bilateral breasts.

RECOMMENDATION:
1. I advised the patient to discuss further treatment with her
doctor. Conservative treatment with warm compresses, time and/or
possibly a course of antibiotics can be considered (though there are
no clear signs of infection). If this does not resolve with
conservative treatment, surgical consultation is recommended.

2. Six-month follow-up left breast ultrasound is recommended if
surgery is not required to ensure resolution.

I have discussed the findings and recommendations with the patient.
If applicable, a reminder letter will be sent to the patient
regarding the next appointment.

BI-RADS CATEGORY  3: Probably benign.

## 2020-09-22 ENCOUNTER — Ambulatory Visit: Payer: BC Managed Care – PPO | Admitting: Family Medicine

## 2020-10-13 ENCOUNTER — Encounter: Payer: Self-pay | Admitting: Family Medicine

## 2020-10-13 ENCOUNTER — Other Ambulatory Visit: Payer: Self-pay

## 2020-10-13 ENCOUNTER — Ambulatory Visit (INDEPENDENT_AMBULATORY_CARE_PROVIDER_SITE_OTHER): Payer: BC Managed Care – PPO | Admitting: Family Medicine

## 2020-10-13 VITALS — BP 159/94 | HR 81 | Temp 98.4°F | Wt 185.0 lb

## 2020-10-13 DIAGNOSIS — I10 Essential (primary) hypertension: Secondary | ICD-10-CM

## 2020-10-13 DIAGNOSIS — E559 Vitamin D deficiency, unspecified: Secondary | ICD-10-CM | POA: Diagnosis not present

## 2020-10-13 DIAGNOSIS — E119 Type 2 diabetes mellitus without complications: Secondary | ICD-10-CM

## 2020-10-13 DIAGNOSIS — Z636 Dependent relative needing care at home: Secondary | ICD-10-CM | POA: Insufficient documentation

## 2020-10-13 DIAGNOSIS — E7800 Pure hypercholesterolemia, unspecified: Secondary | ICD-10-CM

## 2020-10-13 DIAGNOSIS — Z720 Tobacco use: Secondary | ICD-10-CM

## 2020-10-13 LAB — POCT GLYCOSYLATED HEMOGLOBIN (HGB A1C): Hemoglobin A1C: 7.2 % — AB (ref 4.0–5.6)

## 2020-10-13 NOTE — Assessment & Plan Note (Signed)
Chronic Education provided Aware of risks Unable to remove of lifestyle given current stressors Pt aware of resources

## 2020-10-13 NOTE — Addendum Note (Signed)
Addended by: Janey Greaser D on: 10/13/2020 02:54 PM   Modules accepted: Orders

## 2020-10-13 NOTE — Assessment & Plan Note (Signed)
Pt currently taking 3 different BP meds Metop xl diovan norvasc  Denies chest pain, shortness of breath, or palpitations  No edema on exam  Current regimen stable, given stressors- continue mgmt, may increase dose next time if indicated

## 2020-10-13 NOTE — Assessment & Plan Note (Signed)
Able to tolerate metformin 1000 bid without difficulty Notes that she does not take every day or with every meal A1c is down 1.2% in 5 months, congrats- this has improved from prev readings enforced medication compliance to assist A1C in goal of <7%  Discussed diet and exercise Schedule eye exam On Statin F/u in 3 months

## 2020-10-13 NOTE — Progress Notes (Signed)
Established patient visit   Patient: Madison Ritter   DOB: 01-30-1961   60 y.o. Female  MRN: [id number] Visit Date: 10/13/2020  Today's healthcare provider: Jacky Kindle, FNP   No chief complaint on file.  Subjective    HPI  Diabetes Mellitus Type II, Follow-up  Lab Results  Component Value Date   HGBA1C 8.4 (A) 06/14/2020   HGBA1C 8.0 (H) 03/15/2020   HGBA1C 7.4 (A) 08/09/2019   Wt Readings from Last 3 Encounters:  10/13/20 185 lb (83.9 kg)  06/14/20 194 lb (88 kg)  03/15/20 196 lb 6.4 oz (89.1 kg)   Last seen for diabetes 4 months ago.  Management since then includes none. She reports good compliance with treatment. She is not having side effects.  Symptoms: Yes fatigue No foot ulcerations  Yes appetite changes No nausea  No paresthesia of the feet  No polydipsia  No polyuria No visual disturbances   No vomiting     Home blood sugar records:  not being checked  Episodes of hypoglycemia? No    Current insulin regiment: none Most Recent Eye Exam: patient is due.  Pertinent Labs: Lab Results  Component Value Date   CHOL 230 (H) 03/15/2020   HDL 61 03/15/2020   LDLCALC 139 (H) 03/15/2020   TRIG 167 (H) 03/15/2020   CHOLHDL 3.8 03/15/2020   Lab Results  Component Value Date   NA 139 03/15/2020   K 3.7 03/15/2020   CREATININE 0.77 03/15/2020   GFRNONAA 86 03/15/2020   GFRAA 99 03/15/2020   GLUCOSE 192 (H) 03/15/2020     --------------------------------------------------------------------------------------------------- Hypothyroid, follow-up  Lab Results  Component Value Date   TSH 0.443 (L) 03/15/2020   TSH 0.574 01/07/2019   TSH 0.585 12/05/2017   Wt Readings from Last 3 Encounters:  10/13/20 185 lb (83.9 kg)  06/14/20 194 lb (88 kg)  03/15/20 196 lb 6.4 oz (89.1 kg)    She was last seen for hypothyroid 4 months ago.  Management since that visit includes none. She reports good compliance with treatment. She is not having side  effects.   Symptoms: Yes change in energy level No constipation  No diarrhea No heat / cold intolerance  No nervousness No palpitations  No weight changes    -----------------------------------------------------------------------------------------       Medications: Outpatient Medications Prior to Visit  Medication Sig   acetaminophen (TYLENOL) 500 MG tablet Take 1,000 mg by mouth every 6 (six) hours as needed (for pain.).   amLODipine (NORVASC) 10 MG tablet TAKE 1 TABLET(10 MG) BY MOUTH DAILY   atorvastatin (LIPITOR) 20 MG tablet Take 1 tablet (20 mg total) by mouth daily.   estradiol (ESTRACE VAGINAL) 0.1 MG/GM vaginal cream Place 1 Applicatorful vaginally 3 (three) times a week.   metFORMIN (GLUCOPHAGE) 500 MG tablet Take 2 tablets (1,000 mg total) by mouth daily with breakfast.   metoprolol succinate (TOPROL-XL) 50 MG 24 hr tablet TAKE 1 TABLET BY MOUTH DAILY. TAKE WITH OR IMMEDIATELY FOLLOWING A MEAL   valsartan-hydrochlorothiazide (DIOVAN-HCT) 160-25 MG tablet Take 1 tablet by mouth daily.   Vitamin D, Ergocalciferol, (DRISDOL) 1.25 MG (50000 UNIT) CAPS capsule Take 1 capsule (50,000 Units total) by mouth every 7 (seven) days.   No facility-administered medications prior to visit.    Review of Systems  Constitutional: Negative.   Respiratory: Negative.    Cardiovascular: Negative.   Endocrine: Negative.       Objective    BP (!) 159/94 (BP Location:  Right Arm, Patient Position: Sitting, Cuff Size: Normal)   Pulse 81   Temp 98.4 F (36.9 C) (Oral)   Wt 185 lb (83.9 kg)   SpO2 99%   BMI 31.76 kg/m     Physical Exam Vitals and nursing note reviewed.  Constitutional:      Appearance: She is obese.  HENT:     Head: Normocephalic.  Cardiovascular:     Rate and Rhythm: Normal rate and regular rhythm.     Pulses: Normal pulses.          Carotid pulses are 2+ on the right side and 2+ on the left side.      Radial pulses are 2+ on the right side and 2+ on the left  side.       Femoral pulses are 2+ on the right side and 2+ on the left side.      Popliteal pulses are 2+ on the right side and 2+ on the left side.       Dorsalis pedis pulses are 2+ on the right side and 2+ on the left side.       Posterior tibial pulses are 2+ on the right side and 2+ on the left side.     Heart sounds: Normal heart sounds, S1 normal and S2 normal. No murmur heard.   No friction rub. No gallop.  Pulmonary:     Effort: Pulmonary effort is normal.     Breath sounds: Normal breath sounds.  Abdominal:     General: Bowel sounds are normal.     Palpations: Abdomen is soft.     Tenderness: There is no abdominal tenderness.  Musculoskeletal:     Right lower leg: No edema.     Left lower leg: No edema.  Skin:    General: Skin is warm and dry.          Comments: Pt expressed concern over return of inverse vitiligo at 1) axillary fold and 2) lip line- derm referral at later date if cont to worsen  Neurological:     Mental Status: She is alert.  Psychiatric:        Attention and Perception: Attention normal.        Mood and Affect: Mood normal.     No results found for any visits on 10/13/20.  Assessment & Plan     Problem List Items Addressed This Visit       Cardiovascular and Mediastinum   Essential (primary) hypertension    Pt currently taking 3 different BP meds Metop xl diovan norvasc  Denies chest pain, shortness of breath, or palpitations  No edema on exam  Current regimen stable, given stressors- continue mgmt, may increase dose next time if indicated       Relevant Orders   TSH   Comprehensive metabolic panel   CBC     Endocrine   Diabetes mellitus type 2, uncomplicated (HCC) - Primary    Able to tolerate metformin 1000 bid without difficulty Notes that she does not take every day or with every meal A1c is down 1.2% in 5 months, congrats- this has improved from prev readings enforced medication compliance to assist A1C in goal of <7%   Discussed diet and exercise Schedule eye exam On Statin F/u in 3 months          Relevant Orders   Lipid Panel With LDL/HDL Ratio   CBC     Other   Hypercholesteremia    Stable  On statin Repeat labs today recommend diet low in saturated fat and regular exercise - 30 min at least 5 times per week        Relevant Orders   Comprehensive metabolic panel   Vitamin D deficiency, unspecified    Hx of Vit D level is low.    If remains low on today's lab work- Recommend starting OTC Vit D3 1000-2000 units daily in place of previous Rx to aid in compliance        Relevant Orders   Vitamin D (25 hydroxy)   Current tobacco use    Chronic Education provided Aware of risks Unable to remove of lifestyle given current stressors Pt aware of resources        Caregiver burden    Husband currently inpatient at [facility] -hx of amputation -on iHD for ESRD -prev fluid imbalance Mom with chronic health concerns Children request for pt to be seen by counselor to assist with stressors  Amb ref placed today, pt agreeable to sharing burden with someone other than friend or family and learning resources and coping tools       Relevant Orders   Ambulatory referral to Psychiatry    No follow-ups on file.      Leilani Merl, FNP, have reviewed all documentation for this visit. The documentation on 10/13/20 for the exam, diagnosis, procedures, and orders are all accurate and complete.    Jacky Kindle, FNP  [facility] [phone] (phone) [phone] (fax)  [facility]

## 2020-10-13 NOTE — Assessment & Plan Note (Signed)
Husband currently inpatient at [facility] -hx of amputation -on iHD for ESRD -prev fluid imbalance Mom with chronic health concerns Children request for pt to be seen by counselor to assist with stressors  Amb ref placed today, pt agreeable to sharing burden with someone other than friend or family and learning resources and coping tools

## 2020-10-13 NOTE — Assessment & Plan Note (Signed)
Stable  On statin Repeat labs today recommend diet low in saturated fat and regular exercise - 30 min at least 5 times per week

## 2020-10-13 NOTE — Assessment & Plan Note (Signed)
Hx of Vit D level is low.    If remains low on today's lab work- Recommend starting OTC Vit D3 1000-2000 units daily in place of previous Rx to aid in compliance

## 2020-10-18 ENCOUNTER — Other Ambulatory Visit: Payer: Self-pay | Admitting: Family Medicine

## 2020-10-18 DIAGNOSIS — E0590 Thyrotoxicosis, unspecified without thyrotoxic crisis or storm: Secondary | ICD-10-CM

## 2020-10-18 DIAGNOSIS — E119 Type 2 diabetes mellitus without complications: Secondary | ICD-10-CM

## 2020-10-18 LAB — LIPID PANEL WITH LDL/HDL RATIO
Cholesterol, Total: 201 mg/dL — ABNORMAL HIGH (ref 100–199)
HDL: 63 mg/dL (ref >39)
LDL Chol Calc (NIH): 117 mg/dL — ABNORMAL HIGH (ref 0–99)
LDL/HDL Ratio: 1.9 ratio (ref 0.0–3.2)
Triglycerides: 121 mg/dL (ref 0–149)
VLDL Cholesterol Cal: 21 mg/dL (ref 5–40)

## 2020-10-18 LAB — CBC
Hematocrit: 47 % — ABNORMAL HIGH (ref 34.0–46.6)
Hemoglobin: 14.8 g/dL (ref 11.1–15.9)
MCH: 30.5 pg (ref 26.6–33.0)
MCHC: 31.5 g/dL (ref 31.5–35.7)
MCV: 97 fL (ref 79–97)
Platelets: 314 10*3/uL (ref 150–450)
RBC: 4.86 x10E6/uL (ref 3.77–5.28)
RDW: 12.9 % (ref 11.7–15.4)
WBC: 9.9 10*3/uL (ref 3.4–10.8)

## 2020-10-18 LAB — COMPREHENSIVE METABOLIC PANEL
ALT: 27 IU/L (ref 0–32)
AST: 11 IU/L (ref 0–40)
Albumin/Globulin Ratio: 1.5 (ref 1.2–2.2)
Albumin: 4.3 g/dL (ref 3.8–4.9)
Alkaline Phosphatase: 169 IU/L — ABNORMAL HIGH (ref 44–121)
BUN/Creatinine Ratio: 19 (ref 9–23)
BUN: 15 mg/dL (ref 6–24)
Bilirubin Total: 0.3 mg/dL (ref 0.0–1.2)
CO2: 22 mmol/L (ref 20–29)
Calcium: 10 mg/dL (ref 8.7–10.2)
Chloride: 103 mmol/L (ref 96–106)
Creatinine, Ser: 0.77 mg/dL (ref 0.57–1.00)
Globulin, Total: 2.8 g/dL (ref 1.5–4.5)
Glucose: 135 mg/dL — ABNORMAL HIGH (ref 65–99)
Potassium: 3.6 mmol/L (ref 3.5–5.2)
Sodium: 145 mmol/L — ABNORMAL HIGH (ref 134–144)
Total Protein: 7.1 g/dL (ref 6.0–8.5)
eGFR: 89 mL/min/{1.73_m2} (ref >59)

## 2020-10-18 LAB — VITAMIN D 25 HYDROXY (VIT D DEFICIENCY, FRACTURES): Vit D, 25-Hydroxy: 13.4 ng/mL — ABNORMAL LOW (ref 30.0–100.0)

## 2020-10-18 LAB — TSH: TSH: 0.289 u[IU]/mL — ABNORMAL LOW (ref 0.450–4.500)

## 2020-10-26 ENCOUNTER — Other Ambulatory Visit: Payer: Self-pay

## 2020-10-26 ENCOUNTER — Telehealth: Payer: Self-pay | Admitting: Family Medicine

## 2020-10-26 DIAGNOSIS — E119 Type 2 diabetes mellitus without complications: Secondary | ICD-10-CM

## 2020-10-26 MED ORDER — METFORMIN HCL 500 MG PO TABS: 1000.0000 mg | ORAL_TABLET | Freq: Every day | ORAL | 1 refills | Status: DC

## 2020-10-26 NOTE — Telephone Encounter (Signed)
[facility] faxed refill request for the following medications:   metFORMIN (GLUCOPHAGE) 500 MG tablet    Please advise.  

## 2020-11-07 ENCOUNTER — Telehealth: Payer: Self-pay

## 2020-11-07 NOTE — Telephone Encounter (Signed)
Copied from CRM [id number]. Topic: General - Other >> Nov 07, 2020  4:03 PM Jaquita Rector A wrote: Reason for CRM: Patient called in stated that she is needing to get her biometric screening done this week asking for a call back for an appointment with Merita Norton. Please call Ph# [phone]

## 2020-11-10 ENCOUNTER — Ambulatory Visit: Payer: BC Managed Care – PPO | Admitting: Family Medicine

## 2020-11-22 ENCOUNTER — Telehealth: Payer: Self-pay

## 2020-11-22 NOTE — Telephone Encounter (Signed)
Patient advised to go to Urgent Care or televisit through mychart.  She said she would go to urgent care as she needed to make sure she wasn't contagious because of going to [facility] to see her husband there.

## 2020-11-22 NOTE — Telephone Encounter (Signed)
Patient called in needing to schedule an appointment for strep concerns. Per patient she took Covid test but it was negative and asking to be seen please call Ph# [phone]

## 2020-11-22 NOTE — Telephone Encounter (Signed)
Copied from CRM [id number]. Topic: Appointment Scheduling - Scheduling Inquiry for Clinic >> Nov 21, 2020  3:53 PM Gwenlyn Fudge wrote: Reason for CRM: Pt called to schedule an appt with Pcp for possible strep throat. Attempted to transfer pt to office. Please advise.

## 2021-01-04 ENCOUNTER — Telehealth: Payer: Self-pay | Admitting: Family Medicine

## 2021-01-04 DIAGNOSIS — I10 Essential (primary) hypertension: Secondary | ICD-10-CM

## 2021-01-04 MED ORDER — METOPROLOL SUCCINATE ER 50 MG PO TB24: 50.0000 mg | ORAL_TABLET | Freq: Every day | ORAL | 3 refills | Status: DC

## 2021-01-04 NOTE — Telephone Encounter (Signed)
[facility] faxed refill request for the following medications:  metoprolol succinate (TOPROL-XL) 50 MG 24 hr tablet  Last Rx: 12/19/19 LOV: 10/13/20 NOV: 01/15/21 Please advise. Thanks TNP

## 2021-01-15 ENCOUNTER — Ambulatory Visit (INDEPENDENT_AMBULATORY_CARE_PROVIDER_SITE_OTHER): Payer: BC Managed Care – PPO | Admitting: Family Medicine

## 2021-01-15 ENCOUNTER — Other Ambulatory Visit: Payer: Self-pay

## 2021-01-15 ENCOUNTER — Encounter: Payer: Self-pay | Admitting: Family Medicine

## 2021-01-15 ENCOUNTER — Ambulatory Visit: Payer: BC Managed Care – PPO | Admitting: Family Medicine

## 2021-01-15 VITALS — BP 193/105 | HR 75 | Temp 97.0°F | Ht 64.0 in | Wt 186.9 lb

## 2021-01-15 DIAGNOSIS — R21 Rash and other nonspecific skin eruption: Secondary | ICD-10-CM | POA: Diagnosis not present

## 2021-01-15 DIAGNOSIS — F4329 Adjustment disorder with other symptoms: Secondary | ICD-10-CM

## 2021-01-15 DIAGNOSIS — Z23 Encounter for immunization: Secondary | ICD-10-CM | POA: Diagnosis not present

## 2021-01-15 DIAGNOSIS — I10 Essential (primary) hypertension: Secondary | ICD-10-CM | POA: Diagnosis not present

## 2021-01-15 DIAGNOSIS — E119 Type 2 diabetes mellitus without complications: Secondary | ICD-10-CM | POA: Diagnosis not present

## 2021-01-15 DIAGNOSIS — N62 Hypertrophy of breast: Secondary | ICD-10-CM

## 2021-01-15 DIAGNOSIS — F5104 Psychophysiologic insomnia: Secondary | ICD-10-CM

## 2021-01-15 LAB — POCT GLYCOSYLATED HEMOGLOBIN (HGB A1C): Hemoglobin A1C: 7 % — AB (ref 4.0–5.6)

## 2021-01-15 MED ORDER — CLOBETASOL PROPIONATE 0.05 % EX OINT: 1.0000 "application " | TOPICAL_OINTMENT | Freq: Two times a day (BID) | CUTANEOUS | 0 refills | Status: DC

## 2021-01-15 MED ORDER — TRAZODONE HCL 50 MG PO TABS: 25.0000 mg | ORAL_TABLET | Freq: Every evening | ORAL | 3 refills | Status: DC | PRN

## 2021-01-15 NOTE — Progress Notes (Signed)
Established patient visit   Patient: Madison Ritter   DOB: 02-28-1961   60 y.o. Female  MRN: [id number] Visit Date: 01/15/2021  Today's healthcare provider: Gwyneth Sprout, FNP   Chief Complaint  Patient presents with   Hyperlipidemia   Hypertension   Diabetes   Subjective    HPI  Diabetes Mellitus Type II, follow-up  Lab Results  Component Value Date   HGBA1C 7.0 (A) 01/15/2021   HGBA1C 7.2 (A) 10/13/2020   HGBA1C 8.4 (A) 06/14/2020   Last seen for diabetes 3 months ago.  Management since then includes continuing the same treatment. She reports fair compliance with treatment. She is not having side effects.   Home blood sugar records:  none  Episodes of hypoglycemia? Yes weakness   Current insulin regiment:none Most Recent Eye Exam: Needs to update appt  --------------------------------------------------------------------------------------------------- Hypertension, follow-up  BP Readings from Last 3 Encounters:  01/15/21 (!) 193/105  10/13/20 (!) 159/94  06/14/20 (!) 153/102   Wt Readings from Last 3 Encounters:  01/15/21 186 lb 14.4 oz (84.8 kg)  10/13/20 185 lb (83.9 kg)  06/14/20 194 lb (88 kg)     She was last seen for hypertension 3 months ago.  BP at that visit was 159/94. Management since that visit includes continue same treatment. She reports fair compliance with treatment. She is not having side effects.  She is not exercising. She is adherent to low salt diet.   Outside blood pressures are diastolic is typically high  She does smoke.  Use of agents associated with hypertension: none.   --------------------------------------------------------------------------------------------------- Lipid/Cholesterol, follow-up  Last Lipid Panel: Lab Results  Component Value Date   CHOL 201 (H) 10/13/2020   LDLCALC 117 (H) 10/13/2020   HDL 63 10/13/2020   TRIG 121 10/13/2020    She was last seen for this 3 months ago.  Management since that  visit includes continue current medication.  She reports fair compliance with treatment. She is not having side effects.   Symptoms: Yes appetite changes No foot ulcerations  No chest pain No chest pressure/discomfort  No dyspnea No orthopnea  Yes fatigue No lower extremity edema  No palpitations No paroxysmal nocturnal dyspnea  No nausea No numbness or tingling of extremity  No polydipsia No polyuria  No speech difficulty No syncope   She is following a Low Sodium diet. Current exercise: none  Last metabolic panel Lab Results  Component Value Date   GLUCOSE 135 (H) 10/13/2020   NA 145 (H) 10/13/2020   K 3.6 10/13/2020   BUN 15 10/13/2020   CREATININE 0.77 10/13/2020   EGFR 89 10/13/2020   GFRNONAA 86 03/15/2020   CALCIUM 10.0 10/13/2020   AST 11 10/13/2020   ALT 27 10/13/2020   The 10-year ASCVD risk score (Arnett DK, et al., 2019) is: 62.8%  ---------------------------------------------------------------------------------------------------     Medications: Outpatient Medications Prior to Visit  Medication Sig   acetaminophen (TYLENOL) 500 MG tablet Take 1,000 mg by mouth every 6 (six) hours as needed (for pain.).   amLODipine (NORVASC) 10 MG tablet TAKE 1 TABLET(10 MG) BY MOUTH DAILY   atorvastatin (LIPITOR) 20 MG tablet Take 1 tablet (20 mg total) by mouth daily.   estradiol (ESTRACE VAGINAL) 0.1 MG/GM vaginal cream Place 1 Applicatorful vaginally 3 (three) times a week.   metFORMIN (GLUCOPHAGE) 500 MG tablet Take 2 tablets (1,000 mg total) by mouth daily with breakfast.   metoprolol succinate (TOPROL-XL) 50 MG 24 hr tablet Take 1  tablet (50 mg total) by mouth daily. TAKE WITH OR IMMEDIATELY FOLLOWING A MEAL.   valsartan-hydrochlorothiazide (DIOVAN-HCT) 160-25 MG tablet Take 1 tablet by mouth daily.   Vitamin D, Ergocalciferol, (DRISDOL) 1.25 MG (50000 UNIT) CAPS capsule Take 1 capsule (50,000 Units total) by mouth every 7 (seven) days.   No facility-administered  medications prior to visit.    Review of Systems     Objective    BP (!) 193/105 (BP Location: Right Arm, Patient Position: Sitting, Cuff Size: Normal)   Pulse 75   Temp (!) 97 F (36.1 C) (Temporal)   Ht 5' 4" (1.626 m)   Wt 186 lb 14.4 oz (84.8 kg)   SpO2 98%   BMI 32.08 kg/m    Physical Exam Vitals and nursing note reviewed.  Constitutional:      General: She is not in acute distress.    Appearance: Normal appearance. She is obese. She is not ill-appearing, toxic-appearing or diaphoretic.  HENT:     Head: Normocephalic and atraumatic.  Cardiovascular:     Rate and Rhythm: Normal rate and regular rhythm.     Pulses: Normal pulses.     Heart sounds: Normal heart sounds. No murmur heard.   No friction rub. No gallop.  Pulmonary:     Effort: Pulmonary effort is normal. No respiratory distress.     Breath sounds: Normal breath sounds. No stridor. No wheezing, rhonchi or rales.  Chest:     Chest wall: No tenderness.  Abdominal:     General: Bowel sounds are normal.     Palpations: Abdomen is soft.  Musculoskeletal:        General: No swelling, tenderness, deformity or signs of injury. Normal range of motion.     Right lower leg: No edema.     Left lower leg: No edema.  Skin:    General: Skin is warm and dry.     Capillary Refill: Capillary refill takes less than 2 seconds.     Coloration: Skin is not jaundiced or pale.     Findings: No bruising, erythema, lesion or rash.  Neurological:     General: No focal deficit present.     Mental Status: She is alert and oriented to person, place, and time. Mental status is at baseline.     Cranial Nerves: No cranial nerve deficit.     Sensory: No sensory deficit.     Motor: No weakness.     Coordination: Coordination normal.  Psychiatric:        Attention and Perception: Attention normal.        Mood and Affect: Mood is depressed. Affect is tearful.        Speech: Speech normal.        Behavior: Behavior normal.         Thought Content: Thought content normal.        Cognition and Memory: Cognition normal.        Judgment: Judgment normal.      Results for orders placed or performed in visit on 01/15/21  POCT HgB A1C  Result Value Ref Range   Hemoglobin A1C 7.0 (A) 4.0 - 5.6 %   HbA1c POC (<> result, manual entry)     HbA1c, POC (prediabetic range)     HbA1c, POC (controlled diabetic range)      Assessment & Plan     Problem List Items Addressed This Visit       Cardiovascular and Mediastinum   Essential (primary) hypertension  Unstable, chronic Noted poor compliance with meds due to stress Advised sticky notes, alarms, prominent places, etc to remind herself RTC 2 weeks for BP check Denies CP, SOB        Endocrine   Type 2 diabetes mellitus without complication, without long-term current use of insulin (HCC) - Primary    A1c at goal; continue metformin Recommend smaller more frequent meals, add exercise      Relevant Orders   POCT HgB A1C (Completed)     Musculoskeletal and Integument   Skin rash in pelvic region    Denies d/c; no time for exam today- trial of steroid ointment, recommend sparingly use      Relevant Medications   clobetasol ointment (TEMOVATE) 0.05 %     Other   Stress and adjustment reaction    Husband continues to remain ill; now in SNF and Dementia is getting worse      Flu vaccine need    Provided today      Relevant Orders   Flu Vaccine QUAD 81moIM (Fluarix, Fluzone & Alfiuria Quad PF) (Completed)   Psychophysiological insomnia    Trial of trazodone      Relevant Medications   traZODone (DESYREL) 50 MG tablet   Gynecomastia    Request for referral for breast reduction surgery      Relevant Orders   Ambulatory referral to Plastic Surgery     Return in about 2 weeks (around 01/29/2021) for blood pressure check, nurse follow up.      IVonna Kotyk FNP, have reviewed all documentation for this visit. The documentation on 01/15/21 for  the exam, diagnosis, procedures, and orders are all accurate and complete.    EGwyneth Sprout [phone](phone) [phone](fax)  [place]

## 2021-01-15 NOTE — Assessment & Plan Note (Signed)
Husband continues to remain ill; now in SNF and Dementia is getting worse

## 2021-01-15 NOTE — Assessment & Plan Note (Signed)
Denies d/c; no time for exam today- trial of steroid ointment, recommend sparingly use

## 2021-01-15 NOTE — Assessment & Plan Note (Signed)
Trial of trazodone. 

## 2021-01-15 NOTE — Assessment & Plan Note (Signed)
Unstable, chronic Noted poor compliance with meds due to stress Advised sticky notes, alarms, prominent places, etc to remind herself RTC 2 weeks for BP check Denies CP, SOB

## 2021-01-15 NOTE — Assessment & Plan Note (Signed)
Provided today 

## 2021-01-15 NOTE — Assessment & Plan Note (Signed)
A1c at goal; continue metformin Recommend smaller more frequent meals, add exercise

## 2021-01-15 NOTE — Assessment & Plan Note (Signed)
Request for referral for breast reduction surgery

## 2021-01-22 ENCOUNTER — Ambulatory Visit: Payer: BC Managed Care – PPO | Admitting: Internal Medicine

## 2021-01-22 NOTE — Progress Notes (Deleted)
Patient ID: Madison Ritter, female   DOB: 10/25/1960, 60 y.o.   MRN: [id number]  This visit occurred during the SARS-CoV-2 public health emergency.  Safety protocols were in place, including screening questions prior to the visit, additional usage of staff PPE, and extensive cleaning of exam room while observing appropriate contact time as indicated for disinfecting solutions.   HPI: Madison Ritter is a 61 y.o.-year-old female, referred by her PCP, Jacky Kindle, FNP, for management of DM2, dx in ***, non-insulin-dependent, uncontrolled, without long-term complications, and thyrotoxicosis.  DM2: Reviewed HbA1c: Lab Results  Component Value Date   HGBA1C 7.0 (A) 01/15/2021   HGBA1C 7.2 (A) 10/13/2020   HGBA1C 8.4 (A) 06/14/2020   HGBA1C 8.0 (H) 03/15/2020   HGBA1C 7.4 (A) 08/09/2019   HGBA1C 7.6 (A) 05/10/2019   HGBA1C 7.7 (H) 01/07/2019   HGBA1C 7.0 (H) 12/05/2017   HGBA1C 7.1 (H) 12/03/2016   HGBA1C 6.7 12/06/2015   Pt is on a regimen of: - Metformin 1000 mg with breakfast  Pt checks her sugars *** a day and they are: - am: n/c - 2h after b'fast: n/c - before lunch: n/c - 2h after lunch: n/c - before dinner: n/c - 2h after dinner: n/c - bedtime: n/c - nighttime: n/c Lowest sugar was ***; she has hypoglycemia awareness at 70.  Highest sugar was ***.  Glucometer:***  Pt's meals are: - Breakfast: - Lunch: - Dinner: - Snacks:  - no CKD, last BUN/creatinine:  Lab Results  Component Value Date   BUN 15 10/13/2020   BUN 12 03/15/2020   CREATININE 0.77 10/13/2020   CREATININE 0.77 03/15/2020  On valsartan 160 mg daily  - + HL; last set of lipids: Lab Results  Component Value Date   CHOL 201 (H) 10/13/2020   HDL 63 10/13/2020   LDLCALC 117 (H) 10/13/2020   TRIG 121 10/13/2020   CHOLHDL 3.8 03/15/2020  On Lipitor 20 mg daily.  - last eye exam was in 03/2017: No DR.   - no numbness and tingling in her feet.  Pt has FH of DM in ***.  Thyrotoxicosis:  Patient  has a history of a slightly low TSH at least since 2017.  Reviewed patient's TFTs: Lab Results  Component Value Date   TSH 0.289 (L) 10/13/2020   TSH 0.443 (L) 03/15/2020   TSH 0.574 01/07/2019   TSH 0.585 12/05/2017   TSH 0.370 (L) 12/03/2016   TSH 0.429 (L) 08/22/2015   TSH 0.51 05/03/2013   No results found for: FREET4, T3FREE  No results found for: TSI  He has a history of thyroid nodularity:  Thyroid U/S (01/15/2019): Parenchymal Echotexture: Normal  Isthmus: 4 mm  Right lobe: 5.2 x 1.9 x 2.0 cm  Left lobe: 4.9 x 1.9 x 1.7 cm _________________________________   Nodule # 1: Location: Right; Inferior  Maximum size: 1.8 cm; Other 2 dimensions: 1.1 x 1.8 cm  Composition: mixed cystic and solid (1)  Echogenicity: isoechoic (1)    This nodule does NOT meet TI-RADS criteria for biopsy or dedicated follow-up. This nodule has been down graded, previously TR 3. _________________________________________________________   There are a few additional scattered subcentimeter cystic and isoechoic nodules noted bilaterally all measuring 6 mm or less in size. These would not meet criteria for any biopsy or follow-up.   Normal vascularity   No regional adenopathy   IMPRESSION: 1.8 cm right inferior TR 2 nodule (down graded from a previous TR 3). This nodule does not meet criteria for  any biopsy or follow-up.   Additional benign subcentimeter nodularity noted.  Pt denies: - feeling nodules in neck - hoarseness - dysphagia - choking - SOB with lying down  No FH of thyroid cancer. No h/o radiation tx to head or neck.  No seaweed or kelp. No recent contrast studies. No herbal supplements. No Biotin use. No recent steroids use.   ROS: Constitutional: no weight gain, + weight loss, no fatigue, no subjective hyperthermia, no subjective hypothermia, no nocturia Eyes: no blurry vision, no xerophthalmia ENT: no sore throat, no nodules palpated in neck, no dysphagia, no  odynophagia, no hoarseness, no tinnitus, no hypoacusis Cardiovascular: no CP, no SOB, no palpitations, no leg swelling Respiratory: no cough, no SOB, no wheezing Gastrointestinal: no N, no V, no D, no C, no acid reflux Musculoskeletal: no muscle, no joint aches Skin: no rash, no hair loss Neurological: no tremors, no numbness or tingling/no dizziness/no HAs Psychiatric: no depression, no anxiety  PE: There were no vitals taken for this visit. Wt Readings from Last 3 Encounters:  01/15/21 186 lb 14.4 oz (84.8 kg)  10/13/20 185 lb (83.9 kg)  06/14/20 194 lb (88 kg)   Constitutional: overweight, in NAD Eyes: PERRLA, EOMI, no exophthalmos ENT: moist mucous membranes, no thyromegaly, no cervical lymphadenopathy Cardiovascular: RRR, No MRG Respiratory: CTA B Gastrointestinal: abdomen soft, NT, ND, BS+ Musculoskeletal: no deformities, strength intact in all 4 Skin: moist, warm, no rashes Neurological: no tremor with outstretched hands, DTR normal in all 4  ASSESSMENT: 1. DM2, non-insulin-dependent, uncontrolled, without long-term complications  2. Low TSH  PLAN:  1. Patient with long-standing, uncontrolled diabetes, on oral antidiabetic regimen, which became insufficient. - I suggested to:  There are no Patient Instructions on file for this visit. - Strongly advised her to start checking sugars at different times of the day - check 1x a day, rotating checks - discussed about CBG targets for treatment: 80-130 mg/dL before meals and <960 mg/dL after meals; target [id number] <7%. - given sugar log and advised how to fill it and to bring it at next appt  - given foot care handout and explained the principles  - given instructions for hypoglycemia management "15-15 rule"  - advised for yearly eye exams  - Return to clinic in 3 mo with sugar log   2. Low TSH -Patient with a longer history of low TSH, without thyrotoxic sxs: No unintentional weight loss, heat intolerance, hyperdefecation,  palpitations, anxiety.  - she does not appear to have exogenous causes for the low TSH.  - We discussed that possible causes of thyrotoxicosis are:  Graves ds   Thyroiditis toxic multinodular goiter/ toxic adenoma (I cannot feel nodules at palpation of her thyroid). - will check the TSH, fT3 and fT4 and also add thyroid stimulating antibodies to screen for Graves' disease.  - If the tests remain abnormal, we may need an uptake and scan to differentiate between the 3 above possible etiologies  - we discussed about possible modalities of treatment for the above conditions, to include methimazole use, radioactive iodine ablation or (last resort) surgery. - I do not feel that we need to add beta blockers at this time, since she is not tachycardic or tremulous - no signs of Graves' ophthalmopathy: she does not have any double vision, blurry vision, eye pain, chemosis. - RTC in 3 months, but likely sooner for repeat labs  Carlus Pavlov, MD PhD [facility] Endocrinology   Carlus Pavlov, MD PhD [facility] Endocrinology

## 2021-01-29 ENCOUNTER — Encounter: Payer: Self-pay | Admitting: Family Medicine

## 2021-01-29 ENCOUNTER — Other Ambulatory Visit: Payer: Self-pay

## 2021-01-29 ENCOUNTER — Ambulatory Visit (INDEPENDENT_AMBULATORY_CARE_PROVIDER_SITE_OTHER): Payer: BC Managed Care – PPO | Admitting: Family Medicine

## 2021-01-29 VITALS — BP 144/84 | HR 67

## 2021-01-29 DIAGNOSIS — I10 Essential (primary) hypertension: Secondary | ICD-10-CM

## 2021-01-29 NOTE — Progress Notes (Signed)
Patient doing well taking blood pressure medications; remains slightly elevated compared to goal standards; however, large improvement from previous appointments.  No POC changes made today.

## 2021-02-19 ENCOUNTER — Encounter: Payer: Self-pay | Admitting: Plastic Surgery

## 2021-02-19 ENCOUNTER — Ambulatory Visit (INDEPENDENT_AMBULATORY_CARE_PROVIDER_SITE_OTHER): Payer: BC Managed Care – PPO | Admitting: Plastic Surgery

## 2021-02-19 ENCOUNTER — Other Ambulatory Visit: Payer: Self-pay

## 2021-02-19 VITALS — BP 154/88 | HR 74 | Ht 64.0 in | Wt 184.0 lb

## 2021-02-19 DIAGNOSIS — G8929 Other chronic pain: Secondary | ICD-10-CM | POA: Diagnosis not present

## 2021-02-19 DIAGNOSIS — M546 Pain in thoracic spine: Secondary | ICD-10-CM

## 2021-02-19 DIAGNOSIS — L989 Disorder of the skin and subcutaneous tissue, unspecified: Secondary | ICD-10-CM | POA: Diagnosis not present

## 2021-02-19 DIAGNOSIS — N62 Hypertrophy of breast: Secondary | ICD-10-CM | POA: Diagnosis not present

## 2021-02-19 NOTE — Progress Notes (Signed)
Referring Provider Jacky Kindle, FNP [address] [place],  [place] [address]   CC:  Breast hypertrophy and back pain   Madison Ritter is an 60 y.o. female.  HPI: The patient is a 60 year old who is interested in breast reduction. Mammary Hyperplasia: The patient is a 60 y.o. female with a history of mammary hyperplasia for several years.  She has extremely large breasts causing symptoms that include the following: Back pain in the upper and lower back, including neck pain. She pulls or pins her bra straps to provide better lift and relief of the pressure and pain. She notices relief by holding her breast up manually.  Her shoulder straps cause grooves and pain and pressure that requires padding for relief. Pain medication is sometimes required with motrin and tylenol.  Activities that are hindered by enlarged breasts include: exercise and running.  She has tried supportive clothing as well as fitted bras without improvement.  Her breasts are extremely large and fairly symmetric.  She has hyperpigmentation of the inframammary area on both sides. .  Preoperative bra size = H cup.  T  Mammogram history: Jan 2022, no masses.  Family history of breast cancer:  maternal aunt and maternal greats aunt.  Tobacco use:  Every day smoker.   The patient expresses the desire to pursue surgical intervention.   Allergies  Allergen Reactions   Sulfa Antibiotics Hives    Outpatient Encounter Medications as of 02/19/2021  Medication Sig   acetaminophen (TYLENOL) 500 MG tablet Take 1,000 mg by mouth every 6 (six) hours as needed (for pain.).   amLODipine (NORVASC) 10 MG tablet TAKE 1 TABLET(10 MG) BY MOUTH DAILY   atorvastatin (LIPITOR) 20 MG tablet Take 1 tablet (20 mg total) by mouth daily.   clobetasol ointment (TEMOVATE) 0.05 % Apply 1 application topically 2 (two) times daily.   estradiol (ESTRACE VAGINAL) 0.1 MG/GM vaginal cream Place 1 Applicatorful vaginally 3 (three) times a week.   metFORMIN  (GLUCOPHAGE) 500 MG tablet Take 2 tablets (1,000 mg total) by mouth daily with breakfast.   metoprolol succinate (TOPROL-XL) 50 MG 24 hr tablet Take 1 tablet (50 mg total) by mouth daily. TAKE WITH OR IMMEDIATELY FOLLOWING A MEAL.   traZODone (DESYREL) 50 MG tablet Take 0.5-1 tablets (25-50 mg total) by mouth at bedtime as needed for sleep.   valsartan-hydrochlorothiazide (DIOVAN-HCT) 160-25 MG tablet Take 1 tablet by mouth daily.   Vitamin D, Ergocalciferol, (DRISDOL) 1.25 MG (50000 UNIT) CAPS capsule Take 1 capsule (50,000 Units total) by mouth every 7 (seven) days.   No facility-administered encounter medications on file as of 02/19/2021.     Past Medical History:  Diagnosis Date   Hypertension     Past Surgical History:  Procedure Laterality Date   ABDOMINAL HYSTERECTOMY  2004   BREAST DUCTAL SYSTEM EXCISION Left 04/14/2019   Procedure: EXCISION DUCTAL SYSTEM BREAST - NO SENTINEL NODE;  Surgeon: Campbell Lerner, MD;  Location: [facility] ORS;  Service: General;  Laterality: Left;   BREAST EXCISIONAL BIOPSY Left 03/2019   EPIDERMAL INCLUSION CYST.   TUBAL LIGATION      Family History  Problem Relation Age of Onset   Diabetes Mother    Hypertension Mother    Congestive Heart Failure Mother    Sleep apnea Mother    Stroke Mother    Hypertension Father    Head & neck cancer Father    Thyroid disease Sister    Hypertension Brother    Stroke Brother  Hypertension Brother    Breast cancer Maternal Aunt 62   Breast cancer Other     SH:  +tobacco use, every day smoker  Review of Systems General: Denies fevers, chills, weight loss CV: Denies chest pain, shortness of breath, palpitations   Physical Exam Vitals with BMI 02/19/2021 01/29/2021 01/15/2021  Height 5\' 4"  - 5\' 4"   Weight 184 lbs - 186 lbs 14 oz  BMI 31.57 - 32.07  Systolic 154 144  Diastolic 88 84 105  Pulse 74 67 75    General:  No acute distress,  Alert and oriented, Non-Toxic, Normal speech and  affect Breast:  The sternal to nipple distance on the right is 33 cm and the left is 34 cm.  The IMF distance is 17 cm on the right and 16 cm on the left. Base width is 20 cm bilaterally.   Assessment/Plan The patient has bilateral symptomatic macromastia.  She is a good candidate for a breast reduction.  She is interested in pursuing surgical treatment.  She has tried supportive garments and fitted bras with no relief.  The details of breast reduction surgery were discussed.  I explained the procedure in detail along the with the expected scars.  The risks were discussed in detail and include bleeding, infection, damage to surrounding structures, need for additional procedures, nipple loss, change in nipple sensation, persistent pain, contour irregularities and asymmetries.  I explained that breast feeding is often not possible after breast reduction surgery.  We discussed the expected postoperative course with an overall recovery period of about 1 month.  She demonstrated full understanding of all risks.  We discussed her personal risk factors that include diabetes.  The patient is interested in pursuing surgical treatment.  We discussed 1 month nicotine free and then we can order a urine cotinine test prior to scheduling for a minimum of 2 months nicotine free.  I anticipate approximately 800 g of tissue removed from each side.   Time based coding: 32 minutes were spent with the patient.  Greater than 50% was spent on counseling cordination of care.  We discussed incisions and expectations for surgery as well as smoking cessation.  02/19/2021, 11:37 AM

## 2021-02-22 ENCOUNTER — Ambulatory Visit: Payer: BC Managed Care – PPO | Admitting: Internal Medicine

## 2021-02-22 NOTE — Progress Notes (Deleted)
Patient ID: Keigan Ruis, female   DOB: 1960/09/01, 60 y.o.   MRN: [id number]  This visit occurred during the SARS-CoV-2 public health emergency.  Safety protocols were in place, including screening questions prior to the visit, additional usage of staff PPE, and extensive cleaning of exam room while observing appropriate contact time as indicated for disinfecting solutions.   HPI: Cash Ord is a 60 y.o.-year-old female, referred by her PCP, Jacky Kindle, FNP, for management of DM2, dx in ***, non-insulin-dependent, uncontrolled, without long-term complications.  Reviewed HbA1c: Lab Results  Component Value Date   HGBA1C 7.0 (A) 01/15/2021   HGBA1C 7.2 (A) 10/13/2020   HGBA1C 8.4 (A) 06/14/2020   HGBA1C 8.0 (H) 03/15/2020   HGBA1C 7.4 (A) 08/09/2019   HGBA1C 7.6 (A) 05/10/2019   HGBA1C 7.7 (H) 01/07/2019   HGBA1C 7.0 (H) 12/05/2017   HGBA1C 7.1 (H) 12/03/2016   HGBA1C 6.7 12/06/2015   Pt is on a regimen of: - Metformin 1000 mg with breakfast  Pt checks her sugars *** a day and they are: - am: n/c - 2h after b'fast: n/c - before lunch: n/c - 2h after lunch: n/c - before dinner: n/c - 2h after dinner: n/c - bedtime: n/c - nighttime: n/c Lowest sugar was ***; she has hypoglycemia awareness at 70.  Highest sugar was ***.  Glucometer:***  Pt's meals are: - Breakfast: - Lunch: - Dinner: - Snacks: No exercise.  - no CKD, last BUN/creatinine:  Lab Results  Component Value Date   BUN 15 10/13/2020   BUN 12 03/15/2020   CREATININE 0.77 10/13/2020   CREATININE 0.77 03/15/2020  On valsartan 160 mg daily  -+ HL; last set of lipids: Lab Results  Component Value Date   CHOL 201 (H) 10/13/2020   HDL 63 10/13/2020   LDLCALC 117 (H) 10/13/2020   TRIG 121 10/13/2020   CHOLHDL 3.8 03/15/2020  She is on atorvastatin 20 mg daily.  - last eye exam was in ***. No DR.   - no numbness and tingling in her feet.  Pt has FH of DM in ***.  She has a history of obesity.   She is a smoker.  ROS: Constitutional: no weight gain, no weight loss, no fatigue, no subjective hyperthermia, no subjective hypothermia, no nocturia Eyes: no blurry vision, no xerophthalmia ENT: no sore throat, no nodules palpated in neck, no dysphagia, no odynophagia, no hoarseness, no tinnitus, no hypoacusis Cardiovascular: no CP, no SOB, no palpitations, no leg swelling Respiratory: no cough, no SOB, no wheezing Gastrointestinal: no N, no V, no D, no C, no acid reflux Musculoskeletal: no muscle, no joint aches Skin: no rash, no hair loss Neurological: no tremors, no numbness or tingling/no dizziness/no HAs Psychiatric: no depression, no anxiety  PE: There were no vitals taken for this visit. Wt Readings from Last 3 Encounters:  02/19/21 184 lb (83.5 kg)  01/15/21 186 lb 14.4 oz (84.8 kg)  10/13/20 185 lb (83.9 kg)   Constitutional: overweight, in NAD Eyes: PERRLA, EOMI, no exophthalmos ENT: moist mucous membranes, no thyromegaly, no cervical lymphadenopathy Cardiovascular: RRR, No MRG Respiratory: CTA B Gastrointestinal: abdomen soft, NT, ND, BS+ Musculoskeletal: no deformities, strength intact in all 4 Skin: moist, warm, no rashes Neurological: no tremor with outstretched hands, DTR normal in all 4  ASSESSMENT: 1. DM2, non-insulin-dependent, uncontrolled, without long-term complications, but with hyperglycemia  PLAN:  1. Patient with long-standing, uncontrolled diabetes, on oral antidiabetic regimen with submaximal metformin dose, with recent improvement in control. -  I suggested to:  There are no Patient Instructions on file for this visit. - Strongly advised her to start checking sugars at different times of the day - check 1x a day, rotating checks - discussed about CBG targets for treatment: 80-130 mg/dL before meals and <809 mg/dL after meals; target XIP3A <7%. - given sugar log and advised how to fill it and to bring it at next appt  - given foot care handout and  explained the principles  - given instructions for hypoglycemia management "15-15 rule"  - advised for yearly eye exams  - Return to clinic in 3 mo with sugar log   Carlus Pavlov, MD PhD [facility] Endocrinology

## 2021-04-10 ENCOUNTER — Other Ambulatory Visit: Payer: Self-pay | Admitting: Physician Assistant

## 2021-04-10 ENCOUNTER — Other Ambulatory Visit: Payer: Self-pay | Admitting: Family Medicine

## 2021-04-10 DIAGNOSIS — E119 Type 2 diabetes mellitus without complications: Secondary | ICD-10-CM

## 2021-04-10 DIAGNOSIS — I10 Essential (primary) hypertension: Secondary | ICD-10-CM

## 2021-04-10 DIAGNOSIS — E7800 Pure hypercholesterolemia, unspecified: Secondary | ICD-10-CM

## 2021-04-10 MED ORDER — ATORVASTATIN CALCIUM 20 MG PO TABS: 20.0000 mg | ORAL_TABLET | Freq: Every day | ORAL | 1 refills | Status: DC

## 2021-04-10 NOTE — Telephone Encounter (Signed)
[facility] faxed refill request for the following medications:  atorvastatin (LIPITOR) 20 MG tablet   Please advise.   [facility]  [address]  [place] [address]

## 2021-04-10 NOTE — Telephone Encounter (Signed)
Requested Prescriptions  Pending Prescriptions Disp Refills   metFORMIN (GLUCOPHAGE) 500 MG tablet [Pharmacy Med Name: METFORMIN 500MG TABLETS] 180 tablet 1    Sig: TAKE 2 TABLETS(1000 MG) BY MOUTH DAILY WITH BREAKFAST     Endocrinology:  Diabetes - Biguanides Failed - 04/10/2021  7:12 AM      Failed - AA eGFR in normal range and within 360 days    GFR calc Af Amer  Date Value Ref Range Status  03/15/2020 99 >59 mL/min/1.73 Final    Comment:    **In accordance with recommendations from the NKF-ASN Task force,**   [facility] is in the process of updating its eGFR calculation to the   2021 CKD-EPI creatinine equation that estimates kidney function   without a race variable.    GFR calc non Af Amer  Date Value Ref Range Status  03/15/2020 86 >59 mL/min/1.73 Final   eGFR  Date Value Ref Range Status  10/13/2020 89 >59 mL/min/1.73 Final         Passed - Cr in normal range and within 360 days    Creatinine, Ser  Date Value Ref Range Status  10/13/2020 0.77 0.57 - 1.00 mg/dL Final         Passed - HBA1C is between 0 and 7.9 and within 180 days    Hemoglobin A1C  Date Value Ref Range Status  01/15/2021 7.0 (A) 4.0 - 5.6 % Final   Hgb A1c MFr Bld  Date Value Ref Range Status  03/15/2020 8.0 (H) 4.8 - 5.6 % Final    Comment:             Prediabetes: 5.7 - 6.4          Diabetes: >6.4          Glycemic control for adults with diabetes: <7.0          Passed - Valid encounter within last 6 months    Recent Outpatient Visits          2 months ago Type 2 diabetes mellitus without complication, without long-term current use of insulin [facility])   [facility] Tally Joe T, FNP   5 months ago Type 2 diabetes mellitus without complication, without long-term current use of insulin [facility])   [facility] Gwyneth Sprout, FNP   10 months ago Essential hypertension   [place], [place], [place]   1 year ago Annual physical exam    [place], [place], [place]   1 year ago Type 2 diabetes mellitus without complication, without long-term current use of insulin [facility])   [place], [place], [place]

## 2021-04-12 ENCOUNTER — Other Ambulatory Visit: Payer: Self-pay | Admitting: Physician Assistant

## 2021-06-28 ENCOUNTER — Other Ambulatory Visit: Payer: Self-pay | Admitting: Family Medicine

## 2021-06-28 DIAGNOSIS — Z1231 Encounter for screening mammogram for malignant neoplasm of breast: Secondary | ICD-10-CM

## 2021-07-05 ENCOUNTER — Ambulatory Visit (INDEPENDENT_AMBULATORY_CARE_PROVIDER_SITE_OTHER): Payer: BC Managed Care – PPO | Admitting: Family Medicine

## 2021-07-05 ENCOUNTER — Encounter: Payer: Self-pay | Admitting: Family Medicine

## 2021-07-05 VITALS — BP 158/102 | HR 75 | Temp 98.5°F | Resp 16 | Wt 183.0 lb

## 2021-07-05 DIAGNOSIS — L819 Disorder of pigmentation, unspecified: Secondary | ICD-10-CM | POA: Insufficient documentation

## 2021-07-05 DIAGNOSIS — E1159 Type 2 diabetes mellitus with other circulatory complications: Secondary | ICD-10-CM | POA: Diagnosis not present

## 2021-07-05 DIAGNOSIS — E1165 Type 2 diabetes mellitus with hyperglycemia: Secondary | ICD-10-CM | POA: Insufficient documentation

## 2021-07-05 DIAGNOSIS — Z1331 Encounter for screening for depression: Secondary | ICD-10-CM | POA: Diagnosis not present

## 2021-07-05 DIAGNOSIS — I152 Hypertension secondary to endocrine disorders: Secondary | ICD-10-CM

## 2021-07-05 NOTE — Progress Notes (Signed)
?  ? ?I,Roshena L Chambers,acting as a scribe for Gwyneth Sprout, FNP.,have documented all relevant documentation on the behalf of Gwyneth Sprout, FNP,as directed by  Gwyneth Sprout, FNP while in the presence of Gwyneth Sprout, FNP.  ? ? ?Established patient visit ? ? ?Patient: Madison Ritter   DOB: [birth year]   61 y.o. Female  MRN: [id number] ?Visit Date: 07/05/2021 ? ?Today's healthcare provider: Gwyneth Sprout, FNP  ?Re Introduced to nurse practitioner role and practice setting.  All questions answered.  Discussed provider/patient relationship and expectations. ? ? ?Chief Complaint  ?Patient presents with  ? Diabetes  ? Hypertension  ? ?Subjective  ?  ?HPI  ?Diabetes Mellitus Type II, Follow-up ? ?Lab Results  ?Component Value Date  ? HGBA1C 7.0 (A) 01/15/2021  ? HGBA1C 7.2 (A) 10/13/2020  ? HGBA1C 8.4 (A) 06/14/2020  ? ?Wt Readings from Last 3 Encounters:  ?07/05/21 183 lb (83 kg)  ?02/19/21 184 lb (83.5 kg)  ?01/15/21 186 lb 14.4 oz (84.8 kg)  ? ?Last seen for diabetes 5 months ago.  ?Management since then includes continue metformin ?Recommend smaller more frequent meals, add exercise. ?She reports good compliance with treatment. ?She is not having side effects.  ?Symptoms: ?Yes fatigue No foot ulcerations  ?No appetite changes No nausea  ?No paresthesia of the feet  No polydipsia  ?No polyuria No visual disturbances   ?No vomiting   ? ? ?Home blood sugar records:  blood sugars are not checked ? ?Episodes of hypoglycemia? No  ?  ?Current insulin regiment: none ?Most Recent Eye Exam: >1 year ago ?Current exercise: none ?Current diet habits: in general, an "unhealthy" diet ? ?Pertinent Labs: ?Lab Results  ?Component Value Date  ? CHOL 201 (H) 10/13/2020  ? HDL 63 10/13/2020  ? [place] 117 (H) 10/13/2020  ? TRIG 121 10/13/2020  ? CHOLHDL 3.8 03/15/2020  ? Lab Results  ?Component Value Date  ? NA 145 (H) 10/13/2020  ? K 3.6 10/13/2020  ? CREATININE 0.77 10/13/2020  ? EGFR 89 10/13/2020  ? MICROALBUR Negative 12/05/2017  ?   ? ?---------------------------------------------------------------------------------------------------  ? ?Hypertension, follow-up ? ?BP Readings from Last 3 Encounters:  ?07/05/21 (!) 158/102  ?02/19/21 (!) 154/88  ?01/29/21 (!) 144/84  ? Wt Readings from Last 3 Encounters:  ?07/05/21 183 lb (83 kg)  ?02/19/21 184 lb (83.5 kg)  ?01/15/21 186 lb 14.4 oz (84.8 kg)  ?  ? ?She was last seen for hypertension 5 months ago.  ?BP at that visit was 193/105. Noted poor compliance with meds due to stress ?Advised sticky notes, alarms, prominent places, etc to remind herself. ? ?She reports good compliance with treatment. ?She is not having side effects.  ?She is following a Regular diet. ?She is not exercising. ?She does smoke. ? ?Use of agents associated with hypertension: none.  ? ?Outside blood pressures are not checked. ?Symptoms: ?No chest pain No chest pressure  ?No palpitations No syncope  ?No dyspnea No orthopnea  ?No paroxysmal nocturnal dyspnea No lower extremity edema  ? ?Pertinent labs ?Lab Results  ?Component Value Date  ? CHOL 201 (H) 10/13/2020  ? HDL 63 10/13/2020  ? [place] 117 (H) 10/13/2020  ? TRIG 121 10/13/2020  ? CHOLHDL 3.8 03/15/2020  ? Lab Results  ?Component Value Date  ? NA 145 (H) 10/13/2020  ? K 3.6 10/13/2020  ? CREATININE 0.77 10/13/2020  ? EGFR 89 10/13/2020  ? GLUCOSE 135 (H) 10/13/2020  ? TSH 0.289 (L) 10/13/2020  ?  ? ?  The 10-year ASCVD risk score (Arnett DK, et al., 2019) is: 42.5% ? ?---------------------------------------------------------------------------------------------------  ? ?Medications: ?Outpatient Medications Prior to Visit  ?Medication Sig  ? acetaminophen (TYLENOL) 500 MG tablet Take 1,000 mg by mouth every 6 (six) hours as needed (for pain.).  ? amLODipine (NORVASC) 10 MG tablet TAKE 1 TABLET(10 MG) BY MOUTH DAILY  ? atorvastatin (LIPITOR) 20 MG tablet Take 1 tablet (20 mg total) by mouth daily.  ? clobetasol ointment (TEMOVATE) 9.45 % Apply 1 application topically 2 (two)  times daily.  ? estradiol (ESTRACE) 0.1 MG/GM vaginal cream INSERT 1 APPLICATORFUL VAGINALLY AT BEDTIME  ? metFORMIN (GLUCOPHAGE) 500 MG tablet TAKE 2 TABLETS(1000 MG) BY MOUTH DAILY WITH BREAKFAST  ? metoprolol succinate (TOPROL-XL) 50 MG 24 hr tablet Take 1 tablet (50 mg total) by mouth daily. TAKE WITH OR IMMEDIATELY FOLLOWING A MEAL.  ? traZODone (DESYREL) 50 MG tablet Take 0.5-1 tablets (25-50 mg total) by mouth at bedtime as needed for sleep.  ? valsartan-hydrochlorothiazide (DIOVAN-HCT) 160-25 MG tablet TAKE 1 TABLET BY MOUTH DAILY  ? Vitamin D, Ergocalciferol, (DRISDOL) 1.25 MG (50000 UNIT) CAPS capsule Take 1 capsule (50,000 Units total) by mouth every 7 (seven) days.  ? ?No facility-administered medications prior to visit.  ? ? ?Review of Systems  ?Constitutional:  Positive for fatigue. Negative for appetite change, chills and fever.  ?Respiratory:  Negative for chest tightness and shortness of breath.   ?Cardiovascular:  Negative for chest pain and palpitations.  ?Gastrointestinal:  Negative for abdominal pain, nausea and vomiting.  ?Neurological:  Negative for dizziness and weakness.  ? ? ?  Objective  ?  ?BP (!) 158/102 (BP Location: Left Arm, Patient Position: Sitting, Cuff Size: Normal)   Pulse 75   Temp 98.5 ?F (36.9 ?C) (Oral)   Resp 16   Wt 183 lb (83 kg)   SpO2 95% Comment: room air  BMI 31.41 kg/m?  ? ?Today's Vitals  ? 07/05/21 1512 07/05/21 1516  ?BP: (!) 137/92 (!) 158/102  ?Pulse: 75   ?Resp: 16   ?Temp: 98.5 ?F (36.9 ?C)   ?TempSrc: Oral   ?SpO2: 95%   ?Weight: 183 lb (83 kg)   ? ?Body mass index is 31.41 kg/m?.  ?  ?Physical Exam ?Vitals and nursing note reviewed.  ?Constitutional:   ?   General: She is not in acute distress. ?   Appearance: Normal appearance. She is obese. She is not ill-appearing, toxic-appearing or diaphoretic.  ?HENT:  ?   Head: Normocephalic and atraumatic.  ?Cardiovascular:  ?   Rate and Rhythm: Normal rate and regular rhythm.  ?   Pulses: Normal pulses.  ?    Heart sounds: Normal heart sounds. No murmur heard. ?  No friction rub. No gallop.  ?Pulmonary:  ?   Effort: Pulmonary effort is normal. No respiratory distress.  ?   Breath sounds: Normal breath sounds. No stridor. No wheezing, rhonchi or rales.  ?Chest:  ?   Chest wall: No tenderness.  ?Abdominal:  ?   General: Bowel sounds are normal.  ?   Palpations: Abdomen is soft.  ?Musculoskeletal:     ?   General: No swelling, tenderness, deformity or signs of injury. Normal range of motion.  ?   Right lower leg: No edema.  ?   Left lower leg: No edema.  ?Skin: ?   General: Skin is warm and dry.  ?   Capillary Refill: Capillary refill takes less than 2 seconds.  ?   Coloration:  Skin is not jaundiced or pale.  ?   Findings: No bruising, erythema, lesion or rash.  ?Neurological:  ?   General: No focal deficit present.  ?   Mental Status: She is alert and oriented to person, place, and time. Mental status is at baseline.  ?   Cranial Nerves: No cranial nerve deficit.  ?   Sensory: No sensory deficit.  ?   Motor: No weakness.  ?   Coordination: Coordination normal.  ?Psychiatric:     ?   Mood and Affect: Mood normal.     ?   Behavior: Behavior normal.     ?   Thought Content: Thought content normal.     ?   Judgment: Judgment normal.  ?  ? ?No results found for any visits on 07/05/21. ? Assessment & Plan  ?  ? ?Problem List Items Addressed This Visit   ? ?  ? Cardiovascular and Mediastinum  ? Hypertension associated with diabetes ([place])  ?  Chronic, elevated; however, pt has been without medication today and acute stress noted yesterday with loss of her spouse s/p chronic illness ?Denies CP ?Denies SOB/ DOE ?Denies low blood pressure/hypotension ?Denies vision changes ?No LE Edema noted on exam ?Continue medication, Norvasc 3m, Toprol 50 mg, Diovan 160-25 mg ?RTC 1 month ?Seek emergent care if you develop chest pain or chest pressure ? ?  ?  ?  ? Endocrine  ? Type 2 diabetes mellitus with hyperglycemia, without long-term  current use of insulin (HCC) - Primary  ?  Chronic, previously stable ?Repeat A1c ?On statin- lipitor 20 mg ?On arb- diovan 160/25 ?Due for eye exam ?Will do urine micro ?Continue 1000 mg metformin qd ?Continue

## 2021-07-05 NOTE — Assessment & Plan Note (Signed)
Chronic, previously stable ?Repeat A1c ?On statin- lipitor 20 mg ?On arb- diovan 160/25 ?Due for eye exam ?Will do urine micro ?Continue 1000 mg metformin qd ?Continue to recommend balanced, lower carb meals. Smaller meal size, adding snacks. Choosing water as drink of choice and increasing purposeful exercise. ? ?

## 2021-07-05 NOTE — Assessment & Plan Note (Signed)
Chronic, likely situational given loss of spouse given last 5 year health battle with inpatient stay for past 8 months ?Denies starting medication at this time ?May wish to take time off from work, will submit FMLA papers max of 30 days once supplied  ?

## 2021-07-05 NOTE — Assessment & Plan Note (Signed)
Chronic, wax/wanes ?Around lip ?Request referral to derm ?

## 2021-07-05 NOTE — Assessment & Plan Note (Signed)
Chronic, elevated; however, pt has been without medication today and acute stress noted yesterday with loss of her spouse s/p chronic illness ?Denies CP ?Denies SOB/ DOE ?Denies low blood pressure/hypotension ?Denies vision changes ?No LE Edema noted on exam ?Continue medication, Norvasc 10mg , Toprol 50 mg, Diovan 160-25 mg ?RTC 1 month ?Seek emergent care if you develop chest pain or chest pressure ? ?

## 2021-07-06 LAB — HEMOGLOBIN A1C
Est. average glucose Bld gHb Est-mCnc: 163 mg/dL
Hgb A1c MFr Bld: 7.3 % — ABNORMAL HIGH (ref 4.8–5.6)

## 2021-07-06 LAB — MICROALBUMIN / CREATININE URINE RATIO
Creatinine, Urine: 243.2 mg/dL
Microalb/Creat Ratio: 9 mg/g creat (ref 0–29)
Microalbumin, Urine: 22.7 ug/mL

## 2021-07-12 ENCOUNTER — Other Ambulatory Visit: Payer: Self-pay | Admitting: Family Medicine

## 2021-07-12 DIAGNOSIS — Z1331 Encounter for screening for depression: Secondary | ICD-10-CM

## 2021-07-19 ENCOUNTER — Telehealth: Payer: Self-pay | Admitting: Family Medicine

## 2021-07-19 NOTE — Telephone Encounter (Signed)
Please update patient on referral. Order for Psychiatry referral was placed on 07/12/2021.  ?

## 2021-07-19 NOTE — Telephone Encounter (Signed)
Pt is calling to follow up on the status of the referral for a therapist since Dane[employer] Dan took the pt out of work. Pt is on FMLA right now. ?CB- [phone] ?

## 2021-07-23 NOTE — Telephone Encounter (Addendum)
Pt called in to follow up on her referral to a therapist? Pt says that she received a call from [facility] says that they don't do therapy.  ? ? ? ?Also pt states that her provider took her out of work due to her husband's death. Pt says that she was updated by FMLA that her provider checked that she is able to work on her paperwork. Pt says that she was told to return to work on 5/22 by her provider. Pt says that she is confused and would like a call back to discuss this further. Pt says that her paperwork need to be updated to reflect what she was told.  ? ? ?Please assist pt further.  ? ? ?

## 2021-07-24 ENCOUNTER — Telehealth: Payer: Self-pay | Admitting: *Deleted

## 2021-07-24 ENCOUNTER — Other Ambulatory Visit: Payer: Self-pay | Admitting: Family Medicine

## 2021-07-24 DIAGNOSIS — F4321 Adjustment disorder with depressed mood: Secondary | ICD-10-CM | POA: Diagnosis not present

## 2021-07-24 DIAGNOSIS — F4329 Adjustment disorder with other symptoms: Secondary | ICD-10-CM

## 2021-07-24 NOTE — Telephone Encounter (Signed)
Pt would like nurse to give her a call back concerning her FMLA papers ?

## 2021-07-24 NOTE — Chronic Care Management (AMB) (Signed)
?  Care Management  ? ?Note ? ?07/24/2021 ?Name: Madison Ritter MRN: [id number] DOB: June 18, 1960 ? ?Madison Ritter is a 61 y.o. year old female who is a primary care patient of Jacky Kindle, FNP. I reached out to [facility] by phone today offer care coordination services.  ? ?[name] was given information about care management services today including:  ?Care management services include personalized support from designated clinical staff supervised by her physician, including individualized plan of care and coordination with other care providers ?24/7 contact phone numbers for assistance for urgent and routine care needs. ?The patient may stop care management services at any time by phone call to the office staff. ? ?Patient agreed to services and verbal consent obtained.  ? ?Follow up plan: ?Telephone appointment with care management team member scheduled for: 07/26/2021 and 07/27/2021 ? ?Larrell Rapozo, CCMA ?Care Guide, Embedded Care Coordination ?[facility]  Care Management  ?Direct Dial: [phone] ? ? ?

## 2021-07-24 NOTE — Telephone Encounter (Signed)
Spoke to patient, she states she was upset and disagrees with the paperwork that was sent to [facility]. Says the location for psych referral she was given does not have a therapist.  ?

## 2021-07-26 ENCOUNTER — Ambulatory Visit: Payer: BC Managed Care – PPO | Admitting: *Deleted

## 2021-07-26 DIAGNOSIS — E1159 Type 2 diabetes mellitus with other circulatory complications: Secondary | ICD-10-CM

## 2021-07-26 DIAGNOSIS — F4329 Adjustment disorder with other symptoms: Secondary | ICD-10-CM

## 2021-07-26 DIAGNOSIS — E1165 Type 2 diabetes mellitus with hyperglycemia: Secondary | ICD-10-CM

## 2021-07-26 NOTE — Chronic Care Management (AMB) (Signed)
Care Management ?Clinical Social Work Note ? ?07/26/2021 ?Name: Madison Ritter MRN: [id number] DOB: 02/02/1961 ? ?Madison Ritter is a 61 y.o. year old female who is a primary care patient of Madison Kindle, FNP.  The Care Management team was consulted for assistance with chronic disease management and coordination needs. ? ?Engaged with patient by telephone for initial visit in response to provider referral for social work chronic care management and care coordination services ? ?Consent to Services:  ?[name] was given information about Care Management services today including:  ?Care Management services includes personalized support from designated clinical staff supervised by her physician, including individualized plan of care and coordination with other care providers ?24/7 contact phone numbers for assistance for urgent and routine care needs. ?The patient may stop case management services at any time by phone call to the office staff. ? ?Patient agreed to services and consent obtained.  ? ?Assessment: Review of patient past medical history, allergies, medications, and health status, including review of relevant consultants reports was performed today as part of a comprehensive evaluation and provision of chronic care management and care coordination services. ? ?SDOH (Social Determinants of Health) assessments and interventions performed:  ?SDOH Interventions   ? ?Flowsheet Row Most Recent Value  ?SDOH Interventions   ?SDOH Interventions for the Following Domains Depression  ?Depression Interventions/Treatment  Counseling, Referral to Psychiatry  ? ?  ?  ? ?Advanced Directives Status: Not addressed in this encounter. ? ?Care Plan ? ?Allergies  ?Allergen Reactions  ? Sulfa Antibiotics Hives  ? ? ?Outpatient Encounter Medications as of 07/26/2021  ?Medication Sig  ? acetaminophen (TYLENOL) 500 MG tablet Take 1,000 mg by mouth every 6 (six) hours as needed (for pain.).  ? amLODipine (NORVASC) 10 MG tablet TAKE 1  TABLET(10 MG) BY MOUTH DAILY  ? atorvastatin (LIPITOR) 20 MG tablet Take 1 tablet (20 mg total) by mouth daily.  ? clobetasol ointment (TEMOVATE) 0.05 % Apply 1 application topically 2 (two) times daily.  ? estradiol (ESTRACE) 0.1 MG/GM vaginal cream INSERT 1 APPLICATORFUL VAGINALLY AT BEDTIME  ? metFORMIN (GLUCOPHAGE) 500 MG tablet TAKE 2 TABLETS(1000 MG) BY MOUTH DAILY WITH BREAKFAST  ? metoprolol succinate (TOPROL-XL) 50 MG 24 hr tablet Take 1 tablet (50 mg total) by mouth daily. TAKE WITH OR IMMEDIATELY FOLLOWING A MEAL.  ? traZODone (DESYREL) 50 MG tablet Take 0.5-1 tablets (25-50 mg total) by mouth at bedtime as needed for sleep.  ? valsartan-hydrochlorothiazide (DIOVAN-HCT) 160-25 MG tablet TAKE 1 TABLET BY MOUTH DAILY  ? Vitamin D, Ergocalciferol, (DRISDOL) 1.25 MG (50000 UNIT) CAPS capsule Take 1 capsule (50,000 Units total) by mouth every 7 (seven) days.  ? ?No facility-administered encounter medications on file as of 07/26/2021.  ? ? ?Patient Active Problem List  ? Diagnosis Date Noted  ? Positive screening for depression on 9-item Patient Health Questionnaire (PHQ-9) 07/05/2021  ? Patchy loss of skin color 07/05/2021  ? Type 2 diabetes mellitus with hyperglycemia, without long-term current use of insulin (HCC) 07/05/2021  ? Hypertension associated with diabetes (HCC) 07/05/2021  ? Stress and adjustment reaction 01/15/2021  ? Type 2 diabetes mellitus without complication, without long-term current use of insulin (HCC) 01/15/2021  ? Flu vaccine need 01/15/2021  ? Psychophysiological insomnia 01/15/2021  ? Gynecomastia 01/15/2021  ? Caregiver burden 10/13/2020  ? Hematuria 08/18/2015  ? Obese 01/11/2015  ? Hypercholesteremia 01/11/2015  ? Vitamin D deficiency, unspecified 01/11/2015  ? Current tobacco use 02/13/2009  ? ? ?Conditions to be addressed/monitored: Stress and  adjustment reaction; Mental Health Concerns  ? ?Care Plan : General Social Work (Adult)  ?Updates made by Wenda Overland, LCSW since  07/26/2021 12:00 AM  ?  ? ?Problem: CHL AMB "PATIENT-SPECIFIC PROBLEM"   ?Note:   ?CARE PLAN ENTRY ?(see longitudinal plan of care for additional care plan information) ? ?Current Barriers:  ?Knowledge deficits related to accessing mental health provider in patient with Depression  ?Patient is experiencing symptoms of  depression and grief which seem to be exacerbated by recent death of spouse.     ?Patient needs Support, Education, and Care Coordination in order to meet unmet mental health needs  ?Mental Health Concerns  ? ?Clinical Social Work Goal(s):  ?Over the next 90 days, patient will work with SW bi-weekly by telephone or in person to reduce or manage symptoms of depression and grief  until connected for ongoing counseling resources.  ?Patient will implement clinical interventions discussed today to decreases symptoms of depression and increase knowledge and/or ability of: coping skills. ?Interventions:  ?Assessed patient's understanding, education, previous treatment and care coordination needs  ?Patient interviewed and appropriate assessments performed: PHQ 2 ?PHQ 9 ?Provided basic mental health support, education and interventions  ?Discussed recent loss of spouse, verbalization of feelings encouraged ?Educated patient on stages of grief and normalized grief process ?Confirmed positive network of support  ?Discussed need for FMLA due to grief response-provider aware per patient and is preparing paperwork ?Follow up with provider encouraged regarding FMLA paperwork submission to discuss her concerns ?Positive coping strategies discussed and reinforced ?Discussed several options for long term counseling based on need and insurance-patient agreeable to ongoing follow up once FMLA is settled ?Other interventions include: PHQ2/ PHQ9 completed ?Active listening / Reflection utilized  ?Emotional Support Provided  ? ?Patient Self Care Activities & Deficits:  ?Patient is unable to independently navigate community  resource options without care coordination support ?Patient is able to implement clinical interventions discussed today and is motivated for treatment  ?Patient will select one of the agencies from the list provided and call to schedule an appointment. ?Performs ADL's independently ?Performs IADL's independently ?Ability for insight ?Strong family or social support ? ?Initial goal documentation ? ?  ?  ? ?Follow Up Plan: SW will follow up with patient by phone over the next 7-14 business days ? ? ?Carvin Almas, LCSW ?Clinical Social Worker  ?[facility] ?[phone] ? ?

## 2021-07-26 NOTE — Patient Instructions (Signed)
Visit Information ? ?Thank you for taking time to visit with me today. Please don't hesitate to contact me if I can be of assistance to you before our next scheduled telephone appointment. ? ?Following are the goals we discussed today:  ? ?- begin personal counseling-list to be provided this CSW ?- join a support group ?- talk about feelings with a friend, family or spiritual advisor ?- practice positive thinking and self-talk  ? ?Our next appointment is by telephone on 08/02/21 at 9am ? ?Please call the care guide team at [phone] if you need to cancel or reschedule your appointment.  ? ?If you are experiencing a Mental Health or Behavioral Health Crisis or need someone to talk to, please call the Suicide and Crisis Lifeline: 988  ? ?Following is a copy of your full plan of care:  ?Care Plan : General Social Work (Adult)  ?Updates made by Wenda Overland, LCSW since 07/26/2021 12:00 AM  ?  ? ?Problem: CHL AMB "PATIENT-SPECIFIC PROBLEM"   ?Note:   ?CARE PLAN ENTRY ?(see longitudinal plan of care for additional care plan information) ? ?Current Barriers:  ?Knowledge deficits related to accessing mental health provider in patient with Depression  ?Patient is experiencing symptoms of  depression and grief which seem to be exacerbated by recent death of spouse.     ?Patient needs Support, Education, and Care Coordination in order to meet unmet mental health needs  ?Mental Health Concerns  ? ?Clinical Social Work Goal(s):  ?Over the next 90 days, patient will work with SW bi-weekly by telephone or in person to reduce or manage symptoms of depression and grief  until connected for ongoing counseling resources.  ?Patient will implement clinical interventions discussed today to decreases symptoms of depression and increase knowledge and/or ability of: coping skills. ?Interventions:  ?Assessed patient's understanding, education, previous treatment and care coordination needs  ?Patient interviewed and appropriate  assessments performed: PHQ 2 ?PHQ 9 ?Provided basic mental health support, education and interventions  ?Discussed recent loss of spouse, verbalization of feelings encouraged ?Educated patient on stages of grief and normalized grief process ?Discussed need for FMLA due to grief response-provider aware per patient and is preparing paperwork ?Follow up with provider encouraged regarding FMLA paperwork submission ?Positive coping strategies discussed and reinforced ?Discussed several options for long term counseling based on need and insurance-patient agreeable to ongoing follow up once FMLA is settled ?Other interventions include: PHQ2/ PHQ9 completed ?Active listening / Reflection utilized  ?Emotional Support Provided  ? ?Patient Self Care Activities & Deficits:  ?Patient is unable to independently navigate community resource options without care coordination support ?Patient is able to implement clinical interventions discussed today and is motivated for treatment  ?Patient will select one of the agencies from the list provided and call to schedule an appointment. ?Performs ADL's independently ?Performs IADL's independently ?Ability for insight ?Strong family or social support ? ?Initial goal documentation ? ?  ? ? ?Ms. Fung was given information about Care Management services by the embedded care coordination team including:  ?Care Management services include personalized support from designated clinical staff supervised by her physician, including individualized plan of care and coordination with other care providers ?24/7 contact phone numbers for assistance for urgent and routine care needs. ?The patient may stop CCM services at any time (effective at the end of the month) by phone call to the office staff. ? ?Patient agreed to services and verbal consent obtained.  ? ?Patient verbalizes understanding of instructions and care plan  provided today and agrees to view in MyChart. Active MyChart status confirmed  with patient.   ? ?Telephone follow up appointment with care management team member scheduled for: 08/02/21 ? ? ?Syrena Burges, LCSW ?Clinical Social Worker  ?[facility] ?[phone] ? ? ?  ?

## 2021-07-27 ENCOUNTER — Ambulatory Visit: Payer: BC Managed Care – PPO

## 2021-07-27 DIAGNOSIS — I10 Essential (primary) hypertension: Secondary | ICD-10-CM

## 2021-07-27 DIAGNOSIS — E1165 Type 2 diabetes mellitus with hyperglycemia: Secondary | ICD-10-CM

## 2021-07-27 DIAGNOSIS — E7800 Pure hypercholesterolemia, unspecified: Secondary | ICD-10-CM

## 2021-07-27 DIAGNOSIS — F4329 Adjustment disorder with other symptoms: Secondary | ICD-10-CM

## 2021-07-31 ENCOUNTER — Ambulatory Visit
Admission: RE | Admit: 2021-07-31 | Discharge: 2021-07-31 | Disposition: A | Discharge status: Home / Self Care | Payer: BC Managed Care – PPO | Source: Ambulatory Visit | Attending: Family Medicine | Admitting: Family Medicine

## 2021-07-31 DIAGNOSIS — Z1231 Encounter for screening mammogram for malignant neoplasm of breast: Secondary | ICD-10-CM | POA: Diagnosis not present

## 2021-08-01 ENCOUNTER — Ambulatory Visit: Payer: Self-pay

## 2021-08-01 DIAGNOSIS — I10 Essential (primary) hypertension: Secondary | ICD-10-CM

## 2021-08-01 DIAGNOSIS — F4329 Adjustment disorder with other symptoms: Secondary | ICD-10-CM

## 2021-08-01 NOTE — Chronic Care Management (AMB) (Signed)
Care Management    RN Visit Note  08/01/2021 Name: Madison Ritter MRN: [id number] DOB: Sep 01, 1960  Subjective: [name] is a 61 y.o. year old female who is a primary care patient of Jacky Kindle, FNP. The care management team was consulted for assistance with disease management and care coordination needs.    Engaged with patient by telephone for follow up visit in response to provider referral for case management and care coordination services.   Consent to Services:   Ms. Traeger was given information about Care Management services including:  Care Management services includes personalized support from designated clinical staff supervised by her physician, including individualized plan of care and coordination with other care providers 24/7 contact phone numbers for assistance for urgent and routine care needs. The patient may stop case management services at any time by phone call to the office staff.  Patient agreed to services and consent obtained.   Assessment: Review of patient past medical history, allergies, medications, health status, including review of consultants reports, laboratory and other test data, was performed as part of comprehensive evaluation and provision of chronic care management services.   SDOH (Social Determinants of Health) assessments and interventions performed: No  Care Plan  Allergies  Allergen Reactions   Sulfa Antibiotics Hives    Outpatient Encounter Medications as of 08/01/2021  Medication Sig   acetaminophen (TYLENOL) 500 MG tablet Take 1,000 mg by mouth every 6 (six) hours as needed (for pain.).   amLODipine (NORVASC) 10 MG tablet TAKE 1 TABLET(10 MG) BY MOUTH DAILY   atorvastatin (LIPITOR) 20 MG tablet Take 1 tablet (20 mg total) by mouth daily.   clobetasol ointment (TEMOVATE) 0.05 % Apply 1 application topically 2 (two) times daily.   estradiol (ESTRACE) 0.1 MG/GM vaginal cream INSERT 1 APPLICATORFUL VAGINALLY AT BEDTIME   metFORMIN  (GLUCOPHAGE) 500 MG tablet TAKE 2 TABLETS(1000 MG) BY MOUTH DAILY WITH BREAKFAST   metoprolol succinate (TOPROL-XL) 50 MG 24 hr tablet Take 1 tablet (50 mg total) by mouth daily. TAKE WITH OR IMMEDIATELY FOLLOWING A MEAL.   traZODone (DESYREL) 50 MG tablet Take 0.5-1 tablets (25-50 mg total) by mouth at bedtime as needed for sleep.   valsartan-hydrochlorothiazide (DIOVAN-HCT) 160-25 MG tablet TAKE 1 TABLET BY MOUTH DAILY   Vitamin D, Ergocalciferol, (DRISDOL) 1.25 MG (50000 UNIT) CAPS capsule Take 1 capsule (50,000 Units total) by mouth every 7 (seven) days.   No facility-administered encounter medications on file as of 08/01/2021.    Patient Active Problem List   Diagnosis Date Noted   Positive screening for depression on 9-item Patient Health Questionnaire (PHQ-9) 07/05/2021   Patchy loss of skin color 07/05/2021   Type 2 diabetes mellitus with hyperglycemia, without long-term current use of insulin (HCC) 07/05/2021   Hypertension associated with diabetes (HCC) 07/05/2021   Stress and adjustment reaction 01/15/2021   Type 2 diabetes mellitus without complication, without long-term current use of insulin (HCC) 01/15/2021   Flu vaccine need 01/15/2021   Psychophysiological insomnia 01/15/2021   Gynecomastia 01/15/2021   Caregiver burden 10/13/2020   Hematuria 08/18/2015   Obese 01/11/2015   Hypercholesteremia 01/11/2015   Vitamin D deficiency, unspecified 01/11/2015   Current tobacco use 02/13/2009    Patient Care Plan: RN Care Management Plan of Care     Problem Identified: HTN, Hypercholesterolemia, DM and Stress/Adjustment Disorder      Long-Range Goal: Disease Progression Prevented or Minimized   Start Date: 07/27/2021  Expected End Date: 10/25/2021  Priority: High  Note:  Current Barriers:  Care Management support and education needs related to HTN, HLD, DMII and Stress and Adjustment Disorder  RNCM Clinical Goal(s):  Patient will demonstrate Improved adherence to  prescribed treatment plan for HTN, HLD, DMII and Stress and Adjustment Disorder through collaboration with the provider, RN Care Manager and the care team.   Interventions: 1:1 collaboration with primary care provider regarding development and update of comprehensive plan of care as evidenced by provider attestation and co-signature Inter-disciplinary care team collaboration (see longitudinal plan of care) Evaluation of current treatment plan related to  self management and patient's adherence to plan as established by provider  Stress/Adjustment Disorder: (Status: New goal.) Long Term Goal Discussed plan for stress and grief related to recent loss of spouse. Reports experiencing continued grief and lack of focus since losing her spouse last month. She is interested in engaging with a Behavioral Health/Counseling group. Denies difficulty performing tasks in the home. Denies suicidal ideations. Reports having good support from family but stressed regarding loss and uncertainty regarding ability to perform when she returns to work. She has submitted documentation for FMLA. Confirmed speaking with the CCM LCSW regarding options for counseling. She was advised to contact [facility] [facility] [facility]. Reports not contacting the group to schedule initial outreach. Strongly advised to contact within the next few days. Requested assistance with contacting the [employer]. Reports documents were submitted by her PCP however the [employer] team is requesting clarification regarding her status as well as expected return to work date. Collaborated with the [employer] today. The team agreed to submit a new package to the clinic. Portions of the documentation will require updated review and signature by the PCP. Some forms will require review and recommendation by her Behavioral Health/Counseling team. Patient verbalized plan to schedule outreach with [facility]. Agreed to  contact the team if she requires assistance before completing counseling.  Update 08/01/2021: Follow up regarding discussion with the [employer]. Updated of providers intention to evaluate patient for follow up in the clinic next week. Provider will update documentation for expected return to work date if patient's condition changes.  Discussed plan with patient. Reports receiving a return call from [facility]. Anticipates being evaluated within the next week. Discussed current symptoms. Reports continuing to adjust. Denies urgent concerns. She is eager to engage with a counseling group. Active Listening during this encounter. Patient will update the team with concerns as needed. Anticipate follow up outreach within the next week.   Patient Goals/Self-Care Activities: Take all medications as prescribed Attend all scheduled provider appointments Call pharmacy for medication refills 3-7 days in advance of running out of medications Attend church or other social activities Perform all self care activities independently  Perform IADL's (shopping, preparing meals, housekeeping, managing finances) independently Call provider office for new concerns or questions    Follow Up Plan:   Will follow up within the next week.      PLAN A member of the care management team will follow up within the next month.  [facility] Care [facility] [phone]

## 2021-08-02 ENCOUNTER — Ambulatory Visit: Payer: BC Managed Care – PPO | Admitting: *Deleted

## 2021-08-02 DIAGNOSIS — E1165 Type 2 diabetes mellitus with hyperglycemia: Secondary | ICD-10-CM

## 2021-08-02 DIAGNOSIS — I152 Hypertension secondary to endocrine disorders: Secondary | ICD-10-CM

## 2021-08-02 DIAGNOSIS — F4322 Adjustment disorder with anxiety: Secondary | ICD-10-CM | POA: Diagnosis not present

## 2021-08-02 DIAGNOSIS — F4329 Adjustment disorder with other symptoms: Secondary | ICD-10-CM

## 2021-08-02 NOTE — Chronic Care Management (AMB) (Signed)
Care Management Clinical Social Work Note  08/02/2021 Name: Madison Ritter MRN: [id number] DOB: Nov 24, 1960  Madison Ritter is a 61 y.o. year old female who is a primary care patient of Jacky Kindle, FNP.  The Care Management team was consulted for assistance with chronic disease management and coordination needs.  Engaged with patient by telephone for follow up visit in response to provider referral for social work chronic care management and care coordination services  Consent to Services:  Madison Ritter was given information about Care Management services today including:  Care Management services includes personalized support from designated clinical staff supervised by her physician, including individualized plan of care and coordination with other care providers 24/7 contact phone numbers for assistance for urgent and routine care needs. The patient may stop case management services at any time by phone call to the office staff.  Patient agreed to services and consent obtained.   Assessment: Review of patient past medical history, allergies, medications, and health status, including review of relevant consultants reports was performed today as part of a comprehensive evaluation and provision of chronic care management and care coordination services.  SDOH (Social Determinants of Health) assessments and interventions performed:    Advanced Directives Status: Not addressed in this encounter.  Care Plan  Allergies  Allergen Reactions   Sulfa Antibiotics Hives    Outpatient Encounter Medications as of 08/02/2021  Medication Sig   acetaminophen (TYLENOL) 500 MG tablet Take 1,000 mg by mouth every 6 (six) hours as needed (for pain.).   amLODipine (NORVASC) 10 MG tablet TAKE 1 TABLET(10 MG) BY MOUTH DAILY   atorvastatin (LIPITOR) 20 MG tablet Take 1 tablet (20 mg total) by mouth daily.   clobetasol ointment (TEMOVATE) 0.05 % Apply 1 application topically 2 (two) times daily.   estradiol  (ESTRACE) 0.1 MG/GM vaginal cream INSERT 1 APPLICATORFUL VAGINALLY AT BEDTIME   metFORMIN (GLUCOPHAGE) 500 MG tablet TAKE 2 TABLETS(1000 MG) BY MOUTH DAILY WITH BREAKFAST   metoprolol succinate (TOPROL-XL) 50 MG 24 hr tablet Take 1 tablet (50 mg total) by mouth daily. TAKE WITH OR IMMEDIATELY FOLLOWING A MEAL.   traZODone (DESYREL) 50 MG tablet Take 0.5-1 tablets (25-50 mg total) by mouth at bedtime as needed for sleep.   valsartan-hydrochlorothiazide (DIOVAN-HCT) 160-25 MG tablet TAKE 1 TABLET BY MOUTH DAILY   Vitamin D, Ergocalciferol, (DRISDOL) 1.25 MG (50000 UNIT) CAPS capsule Take 1 capsule (50,000 Units total) by mouth every 7 (seven) days.   No facility-administered encounter medications on file as of 08/02/2021.    Patient Active Problem List   Diagnosis Date Noted   Positive screening for depression on 9-item Patient Health Questionnaire (PHQ-9) 07/05/2021   Patchy loss of skin color 07/05/2021   Type 2 diabetes mellitus with hyperglycemia, without long-term current use of insulin (HCC) 07/05/2021   Hypertension associated with diabetes (HCC) 07/05/2021   Stress and adjustment reaction 01/15/2021   Type 2 diabetes mellitus without complication, without long-term current use of insulin (HCC) 01/15/2021   Flu vaccine need 01/15/2021   Psychophysiological insomnia 01/15/2021   Gynecomastia 01/15/2021   Caregiver burden 10/13/2020   Hematuria 08/18/2015   Obese 01/11/2015   Hypercholesteremia 01/11/2015   Vitamin D deficiency, unspecified 01/11/2015   Current tobacco use 02/13/2009    Conditions to be addressed/monitored:   Stress and adjustment reaction; Mental Health Concerns  Care Plan : General Social Work (Adult)  Updates made by Wenda Overland, LCSW since 08/02/2021 12:00 AM     Problem: CH[facility] AMB "  PATIENT-SPECIFIC PROBLEM"   Note:   CARE PLAN ENTRY (see longitudinal plan of care for additional care plan information)  Current Barriers:  Knowledge deficits related to  accessing mental health provider in patient with Depression  Patient is experiencing symptoms of  depression and grief which seem to be exacerbated by recent death of spouse.     Patient needs Support, Education, and Care Coordination in order to meet unmet mental health needs  Mental Health Concerns   Clinical Social Work Goal(s):  Over the next 90 days, patient will work with SW bi-weekly by telephone or in person to reduce or manage symptoms of depression and grief  until connected for ongoing counseling resources.  Patient will implement clinical interventions discussed today to decreases symptoms of depression and increase knowledge and/or ability of: coping skills. Interventions:  Continued to assess patient's understanding, education, previous treatment and care coordination needs  Continued to provide basic mental health support, education and interventions related to recent loss of her spouse Patient continues to confirm symptoms of grief and loss-fatigue, lack of motivation-emotional support continues to be provided, grief response normalized, verbalization of feelings encouraged Confirmed  that FMLA paperwork at been approved through 08/06/21-mental health counseling appointment scheduled with [facility] today at 2pm  Confirmed positive network of support-patient's adult children and friends described as supportive Positive coping strategies discussed and reinforced-follow up with Grief Counseling strongly enocuraged Other interventions include: Active listening / Reflection utilized  Emotional Support Provided   Patient Self Care Activities & Deficits:  Patient is unable to independently navigate community resource options without care coordination support Patient is able to implement clinical interventions discussed today and is motivated for treatment  Patient will select one of the agencies from the list provided and call to schedule an appointment. Performs ADL's  independently Performs IADL's independently Ability for insight Strong family or social support  Please see past updates related to this goal by clicking on the "Past Updates" button in the selected goal        Follow Up Plan: SW will follow up with patient by phone over the next 14 business days   [employer], [place] Clinical Social Worker  [facility] [phone]

## 2021-08-02 NOTE — Progress Notes (Signed)
I,[name],acting as a scribe for Gwyneth Sprout, FNP.,have documented all relevant documentation on the behalf of Gwyneth Sprout, FNP,as directed by  Gwyneth Sprout, FNP while in the presence of Gwyneth Sprout, FNP.   Established patient visit   Patient: Madison Ritter   DOB: [birth year]   61 y.o. Female  MRN: [id number] Visit Date: 08/07/2021  Today's healthcare provider: Gwyneth Sprout, FNP  Re Introduced to nurse practitioner role and practice setting.  All questions answered.  Discussed provider/patient relationship and expectations.  Chief Complaint  Patient presents with   Follow-up   Subjective    HPI  Hypertension, follow-up  BP Readings from Last 3 Encounters:  08/07/21 130/90  07/05/21 (!) 158/102  02/19/21 (!) 154/88   Wt Readings from Last 3 Encounters:  08/07/21 187 lb 1.6 oz (84.9 kg)  07/05/21 183 lb (83 kg)  02/19/21 184 lb (83.5 kg)     She was last seen for hypertension 1 months ago.  BP at that visit was 158/102. Management since that visit includes Continue medication, Norvasc 4m, Toprol 50 mg, Diovan 160-25 mg.  She reports excellent compliance with treatment. She is not having side effects.  She is following a Regular diet. She is not exercising. She does smoke.  Outside blood pressures are not being checked regularly but when she does they are 130/80's-90 Symptoms: No chest pain No chest pressure  No palpitations No syncope  No dyspnea No orthopnea  No paroxysmal nocturnal dyspnea No lower extremity edema   Pertinent labs Lab Results  Component Value Date   CHOL 201 (H) 10/13/2020   HDL 63 10/13/2020   LDLCALC 117 (H) 10/13/2020   TRIG 121 10/13/2020   CHOLHDL 3.8 03/15/2020   Lab Results  Component Value Date   NA 145 (H) 10/13/2020   K 3.6 10/13/2020   CREATININE 0.77 10/13/2020   EGFR 89 10/13/2020   GLUCOSE 135 (H) 10/13/2020   TSH 0.289 (L) 10/13/2020     The 10-year ASCVD risk score (Arnett DK, et al., 2019) is:  26.2%  ---------------------------------------------------------------------------------------------------   Medications: Outpatient Medications Prior to Visit  Medication Sig   acetaminophen (TYLENOL) 500 MG tablet Take 1,000 mg by mouth every 6 (six) hours as needed (for pain.).   amLODipine (NORVASC) 10 MG tablet TAKE 1 TABLET(10 MG) BY MOUTH DAILY   atorvastatin (LIPITOR) 20 MG tablet Take 1 tablet (20 mg total) by mouth daily.   clobetasol ointment (TEMOVATE) 08.85% Apply 1 application topically 2 (two) times daily.   estradiol (ESTRACE) 0.1 MG/GM vaginal cream INSERT 1 APPLICATORFUL VAGINALLY AT BEDTIME   metFORMIN (GLUCOPHAGE) 500 MG tablet TAKE 2 TABLETS(1000 MG) BY MOUTH DAILY WITH BREAKFAST   metoprolol succinate (TOPROL-XL) 50 MG 24 hr tablet Take 1 tablet (50 mg total) by mouth daily. TAKE WITH OR IMMEDIATELY FOLLOWING A MEAL.   traZODone (DESYREL) 50 MG tablet Take 0.5-1 tablets (25-50 mg total) by mouth at bedtime as needed for sleep.   valsartan-hydrochlorothiazide (DIOVAN-HCT) 160-25 MG tablet TAKE 1 TABLET BY MOUTH DAILY   Vitamin D, Ergocalciferol, (DRISDOL) 1.25 MG (50000 UNIT) CAPS capsule Take 1 capsule (50,000 Units total) by mouth every 7 (seven) days.   No facility-administered medications prior to visit.    Review of Systems  Constitutional:  Positive for fatigue. Negative for appetite change, chills and fever.  Respiratory:  Negative for chest tightness and shortness of breath.   Cardiovascular:  Negative for chest pain and palpitations.  Gastrointestinal:  Negative for abdominal pain, nausea and vomiting.  Neurological:  Negative for dizziness and weakness.      Objective    BP 130/90 Comment: home  Pulse 78   Temp 98.5 F (36.9 C) (Oral)   Resp 16   Wt 187 lb 1.6 oz (84.9 kg)   BMI 32.12 kg/m    Physical Exam Vitals and nursing note reviewed.  Constitutional:      General: She is not in acute distress.    Appearance: Normal appearance. She is  obese. She is not ill-appearing, toxic-appearing or diaphoretic.  HENT:     Head: Normocephalic and atraumatic.  Cardiovascular:     Rate and Rhythm: Normal rate and regular rhythm.     Pulses: Normal pulses.     Heart sounds: Normal heart sounds. No murmur heard.   No friction rub. No gallop.  Pulmonary:     Effort: Pulmonary effort is normal. No respiratory distress.     Breath sounds: Normal breath sounds. No stridor. No wheezing, rhonchi or rales.  Chest:     Chest wall: No tenderness.  Abdominal:     General: Bowel sounds are normal.     Palpations: Abdomen is soft.  Musculoskeletal:        General: No swelling, tenderness, deformity or signs of injury. Normal range of motion.     Right lower leg: No edema.     Left lower leg: No edema.  Skin:    General: Skin is warm and dry.     Capillary Refill: Capillary refill takes less than 2 seconds.     Coloration: Skin is not jaundiced or pale.     Findings: No bruising, erythema, lesion or rash.  Neurological:     General: No focal deficit present.     Mental Status: She is alert and oriented to person, place, and time. Mental status is at baseline.     Cranial Nerves: No cranial nerve deficit.     Sensory: No sensory deficit.     Motor: No weakness.     Coordination: Coordination normal.  Psychiatric:        Mood and Affect: Mood normal.        Behavior: Behavior normal.        Thought Content: Thought content normal.        Judgment: Judgment normal.      No results found for any visits on 08/07/21.  Assessment & Plan     Problem List Items Addressed This Visit       Cardiovascular and Mediastinum   Hypertension associated with diabetes ([place]) - Primary    Chronic, stable on home readings Continue to encourage more regular dosing and regular timing of blood pressure medication Encourage 5-10% weight loss Encourage smoking cessation  Encourage DASH diet Encourage purposeful exercise- goal of 150 min/week Continue  Norvasc 10 mg, Toprol 50 mg, Diovan-HCT 160-25 mg         Other   Stress and adjustment reaction    Acute on chronic 1 month since passing of spouse Previous had caregiver burden d/t chronic illness of spouse for >5 years Now has connected with CCM and with psych and is appreciative to have someone to "hear her out"         Return in about 3 months (around 11/07/2021) for annual examination.      Vonna Kotyk, FNP, have reviewed all documentation for this visit. The documentation on 08/07/21 for the exam, diagnosis,  procedures, and orders are all accurate and complete.    Gwyneth Sprout, [place] [phone] (phone) [phone] (fax)  [place]

## 2021-08-02 NOTE — Patient Instructions (Signed)
Visit Information  Thank you for taking time to visit with me today. Please don't hesitate to contact me if I can be of assistance to you before our next scheduled telephone appointment.  Following are the goals we discussed today:  begin personal counseling-[facility]ointment [facility] 08/02/21 2pm - join a support group if needed - continue to talk about feelings with a friend, family or spiritual advisor - practice positive thinking and self-talk   Our next appointment is by telephone on 08/16/21 at 9am  Please call the care guide team at [phone] if you need to cancel or reschedule your appointment.   If you are experiencing a Mental Health or Behavioral Health Crisis or need someone to talk to, please call the Suicide and Crisis Lifeline: 988   Patient verbalizes understanding of instructions and care plan provided today and agrees to view in MyChart. Active MyChart status and patient understanding of how to access instructions and care plan via MyChart confirmed with patient.     Telephone follow up appointment with care management team member scheduled for: 08/16/21   Verna Czech, LCSW Clinical Social Worker  [facility] [phone]

## 2021-08-03 ENCOUNTER — Ambulatory Visit: Payer: BC Managed Care – PPO

## 2021-08-03 ENCOUNTER — Encounter: Payer: Self-pay | Admitting: Family Medicine

## 2021-08-03 DIAGNOSIS — F4329 Adjustment disorder with other symptoms: Secondary | ICD-10-CM

## 2021-08-07 ENCOUNTER — Encounter: Payer: Self-pay | Admitting: Family Medicine

## 2021-08-07 ENCOUNTER — Ambulatory Visit (INDEPENDENT_AMBULATORY_CARE_PROVIDER_SITE_OTHER): Payer: BC Managed Care – PPO | Admitting: Family Medicine

## 2021-08-07 VITALS — BP 130/90 | HR 78 | Temp 98.5°F | Resp 16 | Wt 187.1 lb

## 2021-08-07 DIAGNOSIS — F4329 Adjustment disorder with other symptoms: Secondary | ICD-10-CM | POA: Diagnosis not present

## 2021-08-07 DIAGNOSIS — I152 Hypertension secondary to endocrine disorders: Secondary | ICD-10-CM

## 2021-08-07 DIAGNOSIS — E1159 Type 2 diabetes mellitus with other circulatory complications: Secondary | ICD-10-CM

## 2021-08-07 NOTE — Assessment & Plan Note (Signed)
Chronic, stable on home readings Continue to encourage more regular dosing and regular timing of blood pressure medication Encourage 5-10% weight loss Encourage smoking cessation  Encourage DASH diet Encourage purposeful exercise- goal of 150 min/week Continue Norvasc 10 mg, Toprol 50 mg, Diovan-HCT 160-25 mg

## 2021-08-07 NOTE — Assessment & Plan Note (Signed)
Acute on chronic 1 month since passing of spouse Previous had caregiver burden d/t chronic illness of spouse for >5 years Now has connected with CCM and with psych and is appreciative to have someone to "hear her out"

## 2021-08-09 DIAGNOSIS — F4322 Adjustment disorder with anxiety: Secondary | ICD-10-CM | POA: Diagnosis not present

## 2021-08-16 ENCOUNTER — Ambulatory Visit: Payer: BC Managed Care – PPO | Admitting: *Deleted

## 2021-08-16 DIAGNOSIS — Z1331 Encounter for screening for depression: Secondary | ICD-10-CM

## 2021-08-16 DIAGNOSIS — F4329 Adjustment disorder with other symptoms: Secondary | ICD-10-CM

## 2021-08-16 NOTE — Patient Instructions (Signed)
Visit Information  Thank you for taking time to visit with me today. Please don't hesitate to contact me if I can be of assistance to you before our next scheduled telephone appointment.  Following are the goals we discussed today:  continue with begin personal counseling-Next Appointment [facility] scheduled for 08/21/21 - join a support group if needed - continue to talk about feelings with a friend, family or spiritual advisor - practice positive thinking and self-talk   Our next appointment is by telephone on 08/30/21 at 9am  Please call the care guide team at [phone] if you need to cancel or reschedule your appointment.   If you are experiencing a Mental Health or Behavioral Health Crisis or need someone to talk to, please call the Suicide and Crisis Lifeline: 988   Patient verbalizes understanding of instructions and care plan provided today and agrees to view in MyChart. Active MyChart status and patient understanding of how to access instructions and care plan via MyChart confirmed with patient.     Telephone follow up appointment with care management team member scheduled for: 08/30/21   Verna Czech, LCSW Clinical Social Worker  [facility] [phone]

## 2021-08-16 NOTE — Chronic Care Management (AMB) (Signed)
Care Management Clinical Social Work Note  08/16/2021 Name: Madison Ritter MRN: [id number] DOB: 1960-12-06  Madison Ritter is a 61 y.o. year old female who is a primary care patient of Jacky Kindle, FNP.  The Care Management team was consulted for assistance with chronic disease management and coordination needs.  Engaged with patient by telephone for follow up visit in response to provider referral for social work chronic care management and care coordination services  Consent to Services:  Ms. Larrivee was given information about Care Management services today including:  Care Management services includes personalized support from designated clinical staff supervised by her physician, including individualized plan of care and coordination with other care providers 24/7 contact phone numbers for assistance for urgent and routine care needs. The patient may stop case management services at any time by phone call to the office staff.  Patient agreed to services and consent obtained.   Assessment: Review of patient past medical history, allergies, medications, and health status, including review of relevant consultants reports was performed today as part of a comprehensive evaluation and provision of chronic care management and care coordination services.  SDOH (Social Determinants of Health) assessments and interventions performed:    Advanced Directives Status: Not addressed in this encounter.  Care Plan  Allergies  Allergen Reactions   Sulfa Antibiotics Hives    Outpatient Encounter Medications as of 08/16/2021  Medication Sig   acetaminophen (TYLENOL) 500 MG tablet Take 1,000 mg by mouth every 6 (six) hours as needed (for pain.).   amLODipine (NORVASC) 10 MG tablet TAKE 1 TABLET(10 MG) BY MOUTH DAILY   atorvastatin (LIPITOR) 20 MG tablet Take 1 tablet (20 mg total) by mouth daily.   clobetasol ointment (TEMOVATE) 0.05 % Apply 1 application topically 2 (two) times daily.   estradiol  (ESTRACE) 0.1 MG/GM vaginal cream INSERT 1 APPLICATORFUL VAGINALLY AT BEDTIME   metFORMIN (GLUCOPHAGE) 500 MG tablet TAKE 2 TABLETS(1000 MG) BY MOUTH DAILY WITH BREAKFAST   metoprolol succinate (TOPROL-XL) 50 MG 24 hr tablet Take 1 tablet (50 mg total) by mouth daily. TAKE WITH OR IMMEDIATELY FOLLOWING A MEAL.   traZODone (DESYREL) 50 MG tablet Take 0.5-1 tablets (25-50 mg total) by mouth at bedtime as needed for sleep.   valsartan-hydrochlorothiazide (DIOVAN-HCT) 160-25 MG tablet TAKE 1 TABLET BY MOUTH DAILY   Vitamin D, Ergocalciferol, (DRISDOL) 1.25 MG (50000 UNIT) CAPS capsule Take 1 capsule (50,000 Units total) by mouth every 7 (seven) days.   No facility-administered encounter medications on file as of 08/16/2021.    Patient Active Problem List   Diagnosis Date Noted   Positive screening for depression on 9-item Patient Health Questionnaire (PHQ-9) 07/05/2021   Patchy loss of skin color 07/05/2021   Type 2 diabetes mellitus with hyperglycemia, without long-term current use of insulin (HCC) 07/05/2021   Hypertension associated with diabetes (HCC) 07/05/2021   Stress and adjustment reaction 01/15/2021   Type 2 diabetes mellitus without complication, without long-term current use of insulin (HCC) 01/15/2021   Flu vaccine need 01/15/2021   Psychophysiological insomnia 01/15/2021   Gynecomastia 01/15/2021   Caregiver burden 10/13/2020   Hematuria 08/18/2015   Obese 01/11/2015   Hypercholesteremia 01/11/2015   Vitamin D deficiency, unspecified 01/11/2015   Current tobacco use 02/13/2009    Conditions to be addressed/monitored:  Stress and adjustment reaction ; Mental Health Concerns   Care Plan : General Social Work (Adult)  Updates made by Wenda Overland, LCSW since 08/16/2021 12:00 AM     Problem: CHL  AMB "PATIENT-SPECIFIC PROBLEM"   Note:   CARE PLAN ENTRY (see longitudinal plan of care for additional care plan information)  Current Barriers:  Knowledge deficits related to  accessing mental health provider in patient with Depression  Patient is experiencing symptoms of  depression and grief which seem to be exacerbated by recent death of spouse.     Patient needs Support, Education, and Care Coordination in order to meet unmet mental health needs  Mental Health Concerns   Clinical Social Work Goal(s):  Over the next 90 days, patient will work with SW bi-weekly by telephone or in person to reduce or manage symptoms of depression and grief  until connected for ongoing counseling resources.  Patient will implement clinical interventions discussed today to decreases symptoms of depression and increase knowledge and/or ability of: coping skills. Interventions:  Continued to assess patient's understanding, education, previous treatment and care coordination needs  Continued to provide basic mental health support, education and interventions related to recent loss of her spouse Patient continues to confirm symptoms of grief and loss, however patient confirms that activity level has improved-patient making plans for her future return to work and upcoming family gathering-patient looking forward to returning to a routine Patient confirmed commitment to developing a daily scheduled and routine Confirmed  that FMLA paperwork at been approved through 08/06/21-however needs updated paperwork to be submitted to reflect approximate return for 08/17/21-mental health counseling now in place with [facility]-next appointment scheduled for 08/21/21-RNCM contacted for an update Confirmed positive network of support-family get together scheduled for the upcoming weeks Positive coping strategies discussed and reinforced-follow up with Grief Counseling strongly enocuraged Other interventions include: Active listening / Reflection utilized  Emotional Support Provided   Patient Self Care Activities & Deficits:  Patient is unable to independently navigate community resource options without  care coordination support Patient is able to implement clinical interventions discussed today and is motivated for treatment  Patient will select one of the agencies from the list provided and call to schedule an appointment. Performs ADL's independently Performs IADL's independently Ability for insight Strong family or social support  Please see past updates related to this goal by clicking on the "Past Updates" button in the selected goal        Follow Up Plan: SW will follow up with patient by phone over the next 14 business days   [employer], [place] Clinical Social Worker  [facility] Care [facility] [phone]

## 2021-08-17 ENCOUNTER — Ambulatory Visit: Payer: Self-pay

## 2021-08-17 DIAGNOSIS — F4329 Adjustment disorder with other symptoms: Secondary | ICD-10-CM

## 2021-08-17 DIAGNOSIS — I152 Hypertension secondary to endocrine disorders: Secondary | ICD-10-CM

## 2021-08-17 NOTE — Chronic Care Management (AMB) (Signed)
Care Management   CCM RN Visit Note  08/17/2021 Name: Madison Ritter Tolen MRN: [id number] DOB: 05/27/1960  Subjective: Madison Ritter Gerken is a 61 y.o. year old female who is a primary care patient of Jacky Kindleayne, Elise T, FNP. The care management team was consulted for assistance with disease management and care coordination needs.    Engaged with patient by telephone for follow up visit in response to provider referral for case management and care coordination services.   Consent to Services:   Ms. Sidener was given information about Care Management services  including:  Care Management services includes personalized support from designated clinical staff supervised by her physician, including individualized plan of care and coordination with other care providers 24/7 contact phone numbers for assistance for urgent and routine care needs. The patient may stop case management services at any time by phone call to the office staff.  Patient agreed to services and consent obtained.   Assessment: Review of patient past medical history, allergies, medications, health status, including review of consultants reports, laboratory and other test data, was performed as part of comprehensive evaluation and provision of chronic care management services.   SDOH (Social Determinants of Health) assessments and interventions performed: No  CCM Care Plan  Allergies  Allergen Reactions   Sulfa Antibiotics Hives    Outpatient Encounter Medications as of 08/17/2021  Medication Sig   acetaminophen (TYLENOL) 500 MG tablet Take 1,000 mg by mouth every 6 (six) hours as needed (for pain.).   amLODipine (NORVASC) 10 MG tablet TAKE 1 TABLET(10 MG) BY MOUTH DAILY   atorvastatin (LIPITOR) 20 MG tablet Take 1 tablet (20 mg total) by mouth daily.   clobetasol ointment (TEMOVATE) 0.05 % Apply 1 application topically 2 (two) times daily.   estradiol (ESTRACE) 0.1 MG/GM vaginal cream INSERT 1 APPLICATORFUL VAGINALLY AT BEDTIME    metFORMIN (GLUCOPHAGE) 500 MG tablet TAKE 2 TABLETS(1000 MG) BY MOUTH DAILY WITH BREAKFAST   metoprolol succinate (TOPROL-XL) 50 MG 24 hr tablet Take 1 tablet (50 mg total) by mouth daily. TAKE WITH OR IMMEDIATELY FOLLOWING A MEAL.   traZODone (DESYREL) 50 MG tablet Take 0.5-1 tablets (25-50 mg total) by mouth at bedtime as needed for sleep.   valsartan-hydrochlorothiazide (DIOVAN-HCT) 160-25 MG tablet TAKE 1 TABLET BY MOUTH DAILY   Vitamin D, Ergocalciferol, (DRISDOL) 1.25 MG (50000 UNIT) CAPS capsule Take 1 capsule (50,000 Units total) by mouth every 7 (seven) days.   No facility-administered encounter medications on file as of 08/17/2021.    Patient Active Problem List   Diagnosis Date Noted   Positive screening for depression on 9-item Patient Health Questionnaire (PHQ-9) 07/05/2021   Patchy loss of skin color 07/05/2021   Type 2 diabetes mellitus with hyperglycemia, without long-term current use of insulin (HCC) 07/05/2021   Hypertension associated with diabetes (HCC) 07/05/2021   Stress and adjustment reaction 01/15/2021   Type 2 diabetes mellitus without complication, without long-term current use of insulin (HCC) 01/15/2021   Flu vaccine need 01/15/2021   Psychophysiological insomnia 01/15/2021   Gynecomastia 01/15/2021   Caregiver burden 10/13/2020   Hematuria 08/18/2015   Obese 01/11/2015   Hypercholesteremia 01/11/2015   Vitamin D deficiency, unspecified 01/11/2015   Current tobacco use 02/13/2009    Patient Care Plan: RN Care Management Plan of Care     Problem Identified: HTN, Hypercholesterolemia, DM and Stress/Adjustment Disorder      Long-Range Goal: Disease Progression Prevented or Minimized   Start Date: 07/27/2021  Expected End Date: 10/25/2021  Priority: High  Note:   Current Barriers:  Care Management support and education needs related to HTN, HLD, DMII and Stress and Adjustment Disorder  RNCM Clinical Goal(s):  Patient will demonstrate Improved adherence  to prescribed treatment plan for HTN, HLD, DMII and Stress and Adjustment Disorder through collaboration with the provider, RN Care Manager and the care team.   Interventions: 1:1 collaboration with primary care provider regarding development and update of comprehensive plan of care as evidenced by provider attestation and co-signature Inter-disciplinary care team collaboration (see longitudinal plan of care) Evaluation of current treatment plan related to  self management and patient's adherence to plan as established by provider  Stress/Adjustment Disorder: (Status: New goal.) Long Term Goal Discussed plan for stress and grief related to recent loss of spouse. Reports experiencing continued grief and lack of focus since losing her spouse last month. She is interested in engaging with a Behavioral Health/Counseling group. Denies difficulty performing tasks in the home. Denies suicidal ideations. Reports having good support from family but stressed regarding loss and uncertainty regarding ability to perform when she returns to work. She has submitted documentation for FMLA. Confirmed speaking with the CCM LCSW regarding options for counseling. She was advised to contact [employer] [facility]. Reports not contacting the group to schedule initial outreach. Strongly advised to contact within the next few days. Requested assistance with contacting the [employer]. Reports documents were submitted by her PCP however the [employer] team is requesting clarification regarding her status as well as expected return to work date. Collaborated with the [employer] today. The team agreed to submit a new package to the clinic. Portions of the documentation will require updated review and signature by the PCP. Some forms will require review and recommendation by her Behavioral Health/Counseling team. Patient verbalized plan to schedule outreach with [facility]. Agreed to  contact the team if she requires assistance before completing counseling.  Update 08/01/2021: Follow up regarding discussion with the [employer]. Updated of providers intention to evaluate patient for follow up in the clinic next week. Provider will update documentation for expected return to work date if patient's condition changes.  Discussed plan with patient. Reports receiving a return call from [facility]. Anticipates being evaluated within the next week. Discussed current symptoms. Reports continuing to adjust. Denies urgent concerns. She is eager to engage with a counseling group. Active Listening during this encounter. Patient will update the team with concerns as needed. Anticipate follow up outreach within the next week. Update 08/03/21: Received call from patient. 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 Reports additional documentation is required to confirm return to work date. The team is still processing her request for FMLA. Contacted the [employer] regarding specific needs. They are aware that she will be evaluated in the clinic next week. Information relayed to patient's PCP. A provider letter was faxed to the [facility] today specifying patient's recommended return to work date on 08/27/21.   Updated information relayed to patient. Advised to follow up as scheduled next week for follow up clinic evaluation. Agreed to call or contact the clinic if additional information/documentation is requested. Update 08/17/21: Patient called to inform the team that the [facility] has requested additional information regarding FMLA and leave dates. Requesting assistance from the team. Called placed to the [employer]. Thorough discussion regarding documents currently on file and submitted by patient's PCP. Discussed letter that was submitted on  08/03/21 regarding anticipated return to work date. The staff is aware that the date remains as submitted  and was not changed following the patient's clinic visit last month. [employer] staff reports that additional information will be faxed to the clinic if required. Fax number confirmed. Reports that the patient will still need to call and speak with a [employer] staff to confirm information submitted by the clinic. Discuss updates with patient. Informed of need to contact the [employer] to verify all needed information to prevent delays in documents being processed. Patient will attempt to follow up with the group today. Aware that the [employer] may not be available d/t end of business day. Agreed to follow up early Monday morning if she is unable to contact a [employer] today.   Patient Goals/Self-Care Activities: Take all medications as prescribed Attend all scheduled provider appointments Call pharmacy for medication refills 3-7 days in advance of running out of medications Attend church or other social activities Perform all self care activities independently  Perform IADL's (shopping, preparing meals, housekeeping, managing finances) independently Call provider office for new concerns or questions    Follow Up Plan:   Will follow up within the next month      PLAN: A member of the care management team will follow up within the next month.   [facility] [phone]

## 2021-08-23 DIAGNOSIS — F4322 Adjustment disorder with anxiety: Secondary | ICD-10-CM | POA: Diagnosis not present

## 2021-08-30 ENCOUNTER — Ambulatory Visit: Payer: BC Managed Care – PPO | Admitting: *Deleted

## 2021-08-30 DIAGNOSIS — F4329 Adjustment disorder with other symptoms: Secondary | ICD-10-CM

## 2021-08-30 DIAGNOSIS — E1159 Type 2 diabetes mellitus with other circulatory complications: Secondary | ICD-10-CM

## 2021-08-30 NOTE — Patient Instructions (Signed)
Visit Information  Thank you for taking time to visit with me today. Please don't hesitate to contact me if I can be of assistance to you before our next scheduled telephone appointment.  Following are the goals we discussed today:  continue with begin personal counseling if there is a continued need - join a support group if needed - continue to talk about feelings with a friend, family or spiritual advisor - practice positive thinking and self-talk     If you are experiencing a Mental Health or Behavioral Health Crisis or need someone to talk to, please call the Suicide and Crisis Lifeline: 988   Patient verbalizes understanding of instructions and care plan provided today and agrees to view in MyChart. Active MyChart status and patient understanding of how to access instructions and care plan via MyChart confirmed with patient.     No further follow up required: patient to contact this social worker with any additional community resource needs [phone]   Verna Czech, LCSW Clinical Social Worker  [facility] Care [facility] [phone]

## 2021-08-30 NOTE — Chronic Care Management (AMB) (Signed)
Care Management Clinical Social Work Note  08/30/2021 Name: Madison Ritter MRN: [id number] DOB: 1960/11/27  Madison Ritter is a 61 y.o. year old female who is a primary care patient of Madison Kindle, FNP.  The Care Management team was consulted for assistance with chronic disease management and coordination needs.  Engaged with patient by telephone for follow up visit in response to provider referral for social work chronic care management and care coordination services  Consent to Services:  [name] was given information about Care Management services today including:  Care Management services includes personalized support from designated clinical staff supervised by her physician, including individualized plan of care and coordination with other care providers 24/7 contact phone numbers for assistance for urgent and routine care needs. The patient may stop case management services at any time by phone call to the office staff.  Patient agreed to services and consent obtained.   Assessment: Review of patient past medical history, allergies, medications, and health status, including review of relevant consultants reports was performed today as part of a comprehensive evaluation and provision of chronic care management and care coordination services.  SDOH (Social Determinants of Health) assessments and interventions performed:    Advanced Directives Status: Not addressed in this encounter.  Care Plan  Allergies  Allergen Reactions   Sulfa Antibiotics Hives    Outpatient Encounter Medications as of 08/30/2021  Medication Sig   acetaminophen (TYLENOL) 500 MG tablet Take 1,000 mg by mouth every 6 (six) hours as needed (for pain.).   amLODipine (NORVASC) 10 MG tablet TAKE 1 TABLET(10 MG) BY MOUTH DAILY   atorvastatin (LIPITOR) 20 MG tablet Take 1 tablet (20 mg total) by mouth daily.   clobetasol ointment (TEMOVATE) 0.05 % Apply 1 application topically 2 (two) times daily.   estradiol  (ESTRACE) 0.1 MG/GM vaginal cream INSERT 1 APPLICATORFUL VAGINALLY AT BEDTIME   metFORMIN (GLUCOPHAGE) 500 MG tablet TAKE 2 TABLETS(1000 MG) BY MOUTH DAILY WITH BREAKFAST   metoprolol succinate (TOPROL-XL) 50 MG 24 hr tablet Take 1 tablet (50 mg total) by mouth daily. TAKE WITH OR IMMEDIATELY FOLLOWING A MEAL.   traZODone (DESYREL) 50 MG tablet Take 0.5-1 tablets (25-50 mg total) by mouth at bedtime as needed for sleep.   valsartan-hydrochlorothiazide (DIOVAN-HCT) 160-25 MG tablet TAKE 1 TABLET BY MOUTH DAILY   Vitamin D, Ergocalciferol, (DRISDOL) 1.25 MG (50000 UNIT) CAPS capsule Take 1 capsule (50,000 Units total) by mouth every 7 (seven) days.   No facility-administered encounter medications on file as of 08/30/2021.    Patient Active Problem List   Diagnosis Date Noted   Positive screening for depression on 9-item Patient Health Questionnaire (PHQ-9) 07/05/2021   Patchy loss of skin color 07/05/2021   Type 2 diabetes mellitus with hyperglycemia, without long-term current use of insulin (HCC) 07/05/2021   Hypertension associated with diabetes (HCC) 07/05/2021   Stress and adjustment reaction 01/15/2021   Type 2 diabetes mellitus without complication, without long-term current use of insulin (HCC) 01/15/2021   Flu vaccine need 01/15/2021   Psychophysiological insomnia 01/15/2021   Gynecomastia 01/15/2021   Caregiver burden 10/13/2020   Hematuria 08/18/2015   Obese 01/11/2015   Hypercholesteremia 01/11/2015   Vitamin D deficiency, unspecified 01/11/2015   Current tobacco use 02/13/2009    Conditions to be addressed/monitored: Depression; Mental Health Concerns   Care Plan : General Social Work (Adult)  Updates made by Madison Overland, LCSW since 08/30/2021 12:00 AM     Problem: CHL AMB "PATIENT-SPECIFIC PROBLEM"  Note:   CARE PLAN ENTRY (see longitudinal plan of care for additional care plan information)  Current Barriers:  Knowledge deficits related to accessing mental  health provider in patient with Depression  Patient is experiencing symptoms of  depression and grief which seem to be exacerbated by recent death of spouse.     Patient needs Support, Education, and Care Coordination in order to meet unmet mental health needs  Mental Health Concerns   Clinical Social Work Goal(s):  Over the next 90 days, patient will work with SW bi-weekly by telephone or in person to reduce or manage symptoms of depression and grief  until connected for ongoing counseling resources.  Patient will implement clinical interventions discussed today to decreases symptoms of depression and increase knowledge and/or ability of: coping skills. Interventions:  Continued to assess patient's understanding, education, previous treatment and care coordination needs  Continued to provide basic mental health support, education and interventions related to recent loss of her spouse Patient reports feeling better-beginning to settle and return to baseline Patient confirmed a positive support system and motivation to increase socialization activities Confirmed return back to work on 08/27/21, outpatient counseling discontinued due to improvements made Patient agreeable to journaling, increasing involvement in social activities, and accepting support of others Positive reinforcement provided for improvements made and coping strategies utilized Other interventions include: Active listening / Reflection utilized  Emotional Support Provided   Patient Self Care Activities & Deficits:  Patient is unable to independently navigate community resource options without care coordination support Patient is able to implement clinical interventions discussed today and is motivated for treatment  Patient will select one of the agencies from the list provided and call to schedule an appointment. Performs ADL's independently Performs IADL's independently Ability for insight Strong family or social  support  Please see past updates related to this goal by clicking on the "Past Updates" button in the selected goal        Follow Up Plan:  No further follow up required due to improvements made. Patient provided with this social worker's contact information if any community resource needs arise.   Verna Czech, LCSW Clinical Social Worker  [facility] [phone]

## 2021-09-03 NOTE — Patient Instructions (Signed)
Thank you for allowing the care management team to participate in your care. It was great speaking with you!  Care Plan : RN Care Management Plan of Care     Problem: HTN, Hypercholesterolemia, DM and Stress/Adjustment Disorder      Long-Range Goal: Disease Progression Prevented or Minimized   Start Date: 07/27/2021  Expected End Date: 10/25/2021  Priority: High  Note:   Current Barriers:  Care Management support and education needs related to HTN, HLD, DMII and Stress and Adjustment Disorder  RNCM Clinical Goal(s):  Patient will demonstrate Improved adherence to prescribed treatment plan for HTN, HLD, DMII and Stress and Adjustment Disorder through collaboration with the provider, RN Care Manager and the care team.   Interventions: 1:1 collaboration with primary care provider regarding development and update of comprehensive plan of care as evidenced by provider attestation and co-signature Inter-disciplinary care team collaboration (see longitudinal plan of care) Evaluation of current treatment plan related to  self management and patient's adherence to plan as established by provider   Diabetes Interventions:  (Status:  New goal.) Long Term Goal Assessed patient's understanding of A1c goal: <7% Lab Results  Component Value Date   HGBA1C 7.3 (H) 07/05/2021  Reviewed plan for diabetes management.  Provided information regarding importance of blood glucose monitoring. Encouraged to monitor and maintain a log. Reviewed s/sx of hypoglycemia and hyperglycemia along with recommended interventions. Discussed nutritional intake and importance of complying with a diabetic diet. Encouraged to read nutrition labels and limit intake of foods with added sugars.  Discussed and offered referrals for available Diabetes education classes. Declined current need for additional resources. A1C is currently slightly over goal. Will consider if A1C does not improve. Discussed importance of completing  recommended DM preventive care. Advised to complete regular footcare and annual eye exam as recommended.   Hyperlipidemia Interventions:  (Status:  New goal.) Long Term Goal Lab Results  Component Value Date   CHOL 201 (H) 10/13/2020   HDL 63 10/13/2020   LDLCALC 117 (H) 10/13/2020   TRIG 121 10/13/2020   CHOLHDL 3.8 03/15/2020    Medications reviewed  Provider established cholesterol goals reviewed Reviewed importance of completing regular laboratory monitoring as prescribed Reviewed role and benefits of statin for ASCVD risk reduction Discussed strategies to manage statin-induced myalgias Reviewed importance of limiting foods high in cholesterol Reviewed activity goals. Encouraged to engage in low impact activity a few times as week as tolerated.   Hypertension Interventions:  (Status:  New goal.) Long Term Goal Reviewed plan for hypertension management. Advised to take medications as prescribed and notify provider if unable to tolerate regimen. Provided information regarding established blood pressure parameters along with indications for notifying a provider. Advised to monitor and record readings.  Discussed compliance with recommended cardiac prudent diet. Encouraged to read nutrition labels and avoid highly processed foods when possible. Discussed complications of uncontrolled blood pressure.  Reviewed s/sx of heart attack, stroke and worsening symptoms that require immediate medical attention.  Stress/Adjustment Disorder: (Status: New goal.) Long Term Goal Discussed plan for stress and grief related to recent loss of spouse. Reports experiencing continued grief and lack of focus since losing her spouse last month. She is interested in engaging with a Behavioral Health/Counseling group. Denies difficulty performing tasks in the home. Denies suicidal ideations. Reports having good support from family but stressed regarding loss and uncertainty regarding ability to perform when she  returns to work. She has submitted documentation for FMLA. Confirmed speaking with the CCM LCSW regarding  options for counseling. She was advised to contact [facility]. Reports not contacting the group to schedule initial outreach. Strongly advised to contact within the next few days. Requested assistance with contacting the [employer]. Reports documents were submitted by her PCP however the [employer] team is requesting clarification regarding her status as well as expected return to work date. Collaborated with the [employer] today. The team agreed to submit a new package to the clinic. Portions of the documentation will require updated review and signature by the PCP. Some forms will require review and recommendation by her Behavioral Health/Counseling team. Patient verbalized plan to schedule outreach with [facility]. Agreed to contact the team if she requires assistance before completing counseling.   Patient Goals/Self-Care Activities: Take all medications as prescribed Attend all scheduled provider appointments Call pharmacy for medication refills 3-7 days in advance of running out of medications Attend church or other social activities Perform all self care activities independently  Perform IADL's (shopping, preparing meals, housekeeping, managing finances) independently Call provider office for new concerns or questions    Follow Up Plan:   Will follow up next week     The patient verbalized understanding of information discussed during the telephonic outreach. Declined need for mailed/printed instructions.   A member of the care management team will follow up next week.  [facility] Care Management [facility] [phone]

## 2021-09-03 NOTE — Chronic Care Management (AMB) (Signed)
Care Management    RN Visit Note   Name: Maralyn Hickel MRN: [id number] DOB: [birth year]  Subjective: Madison Ritter is a 61 y.o. year old female who is a primary care patient of Jacky Kindle, FNP. The care management team was consulted for assistance with disease management and care coordination needs.    Engaged with patient by telephone for follow up visit in response to provider referral for case management and care coordination services.   Consent to Services:   Madison Ritter was given information about Care Management services including:  Care Management services includes personalized support from designated clinical staff supervised by her physician, including individualized plan of care and coordination with other care providers 24/7 contact phone numbers for assistance for urgent and routine care needs. The patient may stop case management services at any time by phone call to the office staff.  Patient agreed to services and consent obtained.   Assessment: Review of patient past medical history, allergies, medications, health status, including review of consultants reports, laboratory and other test data, was performed as part of comprehensive evaluation and provision of chronic care management services.   SDOH (Social Determinants of Health) assessments and interventions performed: No  Care Plan  Allergies  Allergen Reactions   Sulfa Antibiotics Hives    Outpatient Encounter Medications as of 08/03/2021  Medication Sig   acetaminophen (TYLENOL) 500 MG tablet Take 1,000 mg by mouth every 6 (six) hours as needed (for pain.).   amLODipine (NORVASC) 10 MG tablet TAKE 1 TABLET(10 MG) BY MOUTH DAILY   atorvastatin (LIPITOR) 20 MG tablet Take 1 tablet (20 mg total) by mouth daily.   clobetasol ointment (TEMOVATE) 0.05 % Apply 1 application topically 2 (two) times daily.   estradiol (ESTRACE) 0.1 MG/GM vaginal cream INSERT 1 APPLICATORFUL VAGINALLY AT BEDTIME   metFORMIN  (GLUCOPHAGE) 500 MG tablet TAKE 2 TABLETS(1000 MG) BY MOUTH DAILY WITH BREAKFAST   metoprolol succinate (TOPROL-XL) 50 MG 24 hr tablet Take 1 tablet (50 mg total) by mouth daily. TAKE WITH OR IMMEDIATELY FOLLOWING A MEAL.   traZODone (DESYREL) 50 MG tablet Take 0.5-1 tablets (25-50 mg total) by mouth at bedtime as needed for sleep.   valsartan-hydrochlorothiazide (DIOVAN-HCT) 160-25 MG tablet TAKE 1 TABLET BY MOUTH DAILY   Vitamin D, Ergocalciferol, (DRISDOL) 1.25 MG (50000 UNIT) CAPS capsule Take 1 capsule (50,000 Units total) by mouth every 7 (seven) days.   No facility-administered encounter medications on file as of 08/03/2021.    Patient Active Problem List   Diagnosis Date Noted   Positive screening for depression on 9-item Patient Health Questionnaire (PHQ-9) 07/05/2021   Patchy loss of skin color 07/05/2021   Type 2 diabetes mellitus with hyperglycemia, without long-term current use of insulin (HCC) 07/05/2021   Hypertension associated with diabetes (HCC) 07/05/2021   Stress and adjustment reaction 01/15/2021   Type 2 diabetes mellitus without complication, without long-term current use of insulin (HCC) 01/15/2021   Flu vaccine need 01/15/2021   Psychophysiological insomnia 01/15/2021   Gynecomastia 01/15/2021   Caregiver burden 10/13/2020   Hematuria 08/18/2015   Obese 01/11/2015   Hypercholesteremia 01/11/2015   Vitamin D deficiency, unspecified 01/11/2015   Current tobacco use 02/13/2009    Patient Care Plan: RN Care Management Plan of Care     Problem Identified: HTN, Hypercholesterolemia, DM and Stress/Adjustment Disorder      Long-Range Goal: Disease Progression Prevented or Minimized   Start Date: 07/27/2021  Expected End Date: 10/25/2021  Priority: High  Note:  Current Barriers:  Care Management support and education needs related to HTN, HLD, DMII and Stress and Adjustment Disorder  RNCM Clinical Goal(s):  Patient will demonstrate Improved adherence to  prescribed treatment plan for HTN, HLD, DMII and Stress and Adjustment Disorder through collaboration with the provider, RN Care Manager and the care team.   Interventions: 1:1 collaboration with primary care provider regarding development and update of comprehensive plan of care as evidenced by provider attestation and co-signature Inter-disciplinary care team collaboration (see longitudinal plan of care) Evaluation of current treatment plan related to  self management and patient's adherence to plan as established by provider   footcare and annual eye exam as recommended.   Hyperlipidemia Interventions:  (Not Addressed During the Encounter) Lab Results  Component Value Date   CHOL 201 (H) 10/13/2020   HDL 63 10/13/2020   LDLCALC 117 (H) 10/13/2020   TRIG 121 10/13/2020   CHOLHDL 3.8 03/15/2020    Medications reviewed  Provider established cholesterol goals reviewed Reviewed importance of completing regular laboratory monitoring as prescribed Reviewed role and benefits of statin for ASCVD risk reduction Discussed strategies to manage statin-induced myalgias Reviewed importance of limiting foods high in cholesterol Reviewed activity goals. Encouraged to engage in low impact activity a few times as week as tolerated.   Hypertension Interventions: (Not Addressed During the Encounter) Reviewed plan for hypertension management. Advised to take medications as prescribed and notify provider if unable to tolerate regimen. Provided information regarding established blood pressure parameters along with indications for notifying a provider. Advised to monitor and record readings.  Discussed compliance with recommended cardiac prudent diet. Encouraged to read nutrition labels and avoid highly processed foods when possible. Discussed complications of uncontrolled blood pressure.  Reviewed s/sx of heart attack, stroke and worsening symptoms that require immediate medical  attention.  Stress/Adjustment Disorder:  Discussed plan for stress and grief related to recent loss of spouse. Reports experiencing continued grief and lack of focus since losing her spouse last month. She is interested in engaging with a Behavioral Health/Counseling group. Denies difficulty performing tasks in the home. Denies suicidal ideations. Reports having good support from family but stressed regarding loss and uncertainty regarding ability to perform when she returns to work. She has submitted documentation for FMLA. Confirmed speaking with the CCM LCSW regarding options for counseling. She was advised to contact [facility]. Reports not contacting the group to schedule initial outreach. Strongly advised to contact within the next few days. Requested assistance with contacting the [employer]. Reports documents were submitted by her PCP however the [employer] team is requesting clarification regarding her status as well as expected return to work date. Collaborated with the [employer] today. The team agreed to submit a new package to the clinic. Portions of the documentation will require updated review and signature by the PCP. Some forms will require review and recommendation by her Behavioral Health/Counseling team. Patient verbalized plan to schedule outreach with [facility]. Agreed to contact the team if she requires assistance before completing counseling.  Update 08/01/2021: Follow up regarding discussion with the [employer]. Updated of providers intention to evaluate patient for follow up in the clinic next week. Provider will update documentation for expected return to work date if patient's condition changes.  Discussed plan with patient. Reports receiving a return call from [facility]. Anticipates being evaluated within the next week. Discussed current symptoms. Reports continuing to adjust. Denies urgent  concerns. She is  eager to engage with a counseling group. Active Listening during this encounter. Patient will update the team with concerns as needed. Anticipate follow up outreach within the next week. Update 08/03/21: Received call from patient. 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 Reports additional documentation is required to confirm return to work date. The team is still processing her request for FMLA. Contacted the [employer] regarding specific needs. They are aware that she will be evaluated in the clinic next week. Information relayed to patient's PCP. A provider letter was faxed to the [facility] today specifying patient's recommended return to work date on 08/27/21.   Updated information relayed to patient. Advised to follow up as scheduled next week for follow up clinic evaluation. Agreed to call or contact the clinic if additional information/documentation is requested.   Patient Goals/Self-Care Activities: Take all medications as prescribed Attend all scheduled provider appointments Call pharmacy for medication refills 3-7 days in advance of running out of medications Attend church or other social activities Perform all self care activities independently  Perform IADL's (shopping, preparing meals, housekeeping, managing finances) independently Call provider office for new concerns or questions    Follow Up Plan:   Will follow up next month      PLAN A member of the care management team will follow up next month.   [facility] Care Management [facility] [phone]

## 2021-09-03 NOTE — Chronic Care Management (AMB) (Signed)
Care Management    RN Visit Note   Name: Madison Ritter MRN: [id number] DOB: 1960-12-21  Subjective: Madison Ritter is a 61 y.o. year old female who is a primary care patient of Madison Kindle, FNP. The care management team was consulted for assistance with disease management and care coordination needs.    Engaged with patient by telephone for initial visit in response to provider referral for case management and care coordination services.   Consent to Services:   Madison Ritter was given information about Care Management services including:  Care Management services includes personalized support from designated clinical staff supervised by her physician, including individualized plan of care and coordination with other care providers 24/7 contact phone numbers for assistance for urgent and routine care needs. The patient may stop case management services at any time by phone call to the office staff.  Patient agreed to services and consent obtained.   Assessment: Review of patient past medical history, allergies, medications, health status, including review of consultants reports, laboratory and other test data, was performed as part of comprehensive evaluation and provision of chronic care management services.   SDOH (Social Determinants of Health) assessments and interventions performed:  No    Care Plan  Allergies  Allergen Reactions   Sulfa Antibiotics Hives    Outpatient Encounter Medications as of 07/27/2021  Medication Sig   acetaminophen (TYLENOL) 500 MG tablet Take 1,000 mg by mouth every 6 (six) hours as needed (for pain.).   amLODipine (NORVASC) 10 MG tablet TAKE 1 TABLET(10 MG) BY MOUTH DAILY   atorvastatin (LIPITOR) 20 MG tablet Take 1 tablet (20 mg total) by mouth daily.   clobetasol ointment (TEMOVATE) 0.05 % Apply 1 application topically 2 (two) times daily.   estradiol (ESTRACE) 0.1 MG/GM vaginal cream INSERT 1 APPLICATORFUL VAGINALLY AT BEDTIME   metFORMIN  (GLUCOPHAGE) 500 MG tablet TAKE 2 TABLETS(1000 MG) BY MOUTH DAILY WITH BREAKFAST   metoprolol succinate (TOPROL-XL) 50 MG 24 hr tablet Take 1 tablet (50 mg total) by mouth daily. TAKE WITH OR IMMEDIATELY FOLLOWING A MEAL.   traZODone (DESYREL) 50 MG tablet Take 0.5-1 tablets (25-50 mg total) by mouth at bedtime as needed for sleep.   valsartan-hydrochlorothiazide (DIOVAN-HCT) 160-25 MG tablet TAKE 1 TABLET BY MOUTH DAILY   Vitamin D, Ergocalciferol, (DRISDOL) 1.25 MG (50000 UNIT) CAPS capsule Take 1 capsule (50,000 Units total) by mouth every 7 (seven) days.   No facility-administered encounter medications on file as of 07/27/2021.    Patient Active Problem List   Diagnosis Date Noted   Positive screening for depression on 9-item Patient Health Questionnaire (PHQ-9) 07/05/2021   Patchy loss of skin color 07/05/2021   Type 2 diabetes mellitus with hyperglycemia, without long-term current use of insulin (HCC) 07/05/2021   Hypertension associated with diabetes (HCC) 07/05/2021   Stress and adjustment reaction 01/15/2021   Type 2 diabetes mellitus without complication, without long-term current use of insulin (HCC) 01/15/2021   Flu vaccine need 01/15/2021   Psychophysiological insomnia 01/15/2021   Gynecomastia 01/15/2021   Caregiver burden 10/13/2020   Hematuria 08/18/2015   Obese 01/11/2015   Hypercholesteremia 01/11/2015   Vitamin D deficiency, unspecified 01/11/2015   Current tobacco use 02/13/2009    Patient Care Plan: RN Care Management Plan of Care     Problem Identified: HTN, Hypercholesterolemia, DM and Stress/Adjustment Disorder      Long-Range Goal: Disease Progression Prevented or Minimized   Start Date: 07/27/2021  Expected End Date: 10/25/2021  Priority: High  Note:   Current Barriers:  Care Management support and education needs related to HTN, HLD, DMII and Stress and Adjustment Disorder  RNCM Clinical Goal(s):  Patient will demonstrate Improved adherence to  prescribed treatment plan for HTN, HLD, DMII and Stress and Adjustment Disorder through collaboration with the provider, RN Care Manager and the care team.   Interventions: 1:1 collaboration with primary care provider regarding development and update of comprehensive plan of care as evidenced by provider attestation and co-signature Inter-disciplinary care team collaboration (see longitudinal plan of care) Evaluation of current treatment plan related to  self management and patient's adherence to plan as established by provider   Diabetes Interventions:  (Status:  New goal.) Long Term Goal Assessed patient's understanding of A1c goal: <7% Lab Results  Component Value Date   HGBA1C 7.3 (H) 07/05/2021  Reviewed plan for diabetes management.  Provided information regarding importance of blood glucose monitoring. Encouraged to monitor and maintain a log. Reviewed s/sx of hypoglycemia and hyperglycemia along with recommended interventions. Discussed nutritional intake and importance of complying with a diabetic diet. Encouraged to read nutrition labels and limit intake of foods with added sugars.  Discussed and offered referrals for available Diabetes education classes. Declined current need for additional resources. A1C is currently slightly over goal. Will consider if A1C does not improve. Discussed importance of completing recommended DM preventive care. Advised to complete regular footcare and annual eye exam as recommended.   Hyperlipidemia Interventions:  (Status:  New goal.) Long Term Goal Lab Results  Component Value Date   CHOL 201 (H) 10/13/2020   HDL 63 10/13/2020   LDLCALC 117 (H) 10/13/2020   TRIG 121 10/13/2020   CHOLHDL 3.8 03/15/2020    Medications reviewed  Provider established cholesterol goals reviewed Reviewed importance of completing regular laboratory monitoring as prescribed Reviewed role and benefits of statin for ASCVD risk reduction Discussed strategies to  manage statin-induced myalgias Reviewed importance of limiting foods high in cholesterol Reviewed activity goals. Encouraged to engage in low impact activity a few times as week as tolerated.   Hypertension Interventions:  (Status:  New goal.) Long Term Goal Reviewed plan for hypertension management. Advised to take medications as prescribed and notify provider if unable to tolerate regimen. Provided information regarding established blood pressure parameters along with indications for notifying a provider. Advised to monitor and record readings.  Discussed compliance with recommended cardiac prudent diet. Encouraged to read nutrition labels and avoid highly processed foods when possible. Discussed complications of uncontrolled blood pressure.  Reviewed s/sx of heart attack, stroke and worsening symptoms that require immediate medical attention.  Stress/Adjustment Disorder: (Status: New goal.) Long Term Goal Discussed plan for stress and grief related to recent loss of spouse. Reports experiencing continued grief and lack of focus since losing her spouse last month. She is interested in engaging with a Behavioral Health/Counseling group. Denies difficulty performing tasks in the home. Denies suicidal ideations. Reports having good support from family but stressed regarding loss and uncertainty regarding ability to perform when she returns to work. She has submitted documentation for FMLA. Confirmed speaking with the CCM LCSW regarding options for counseling. She was advised to contact [facility]. Reports not contacting the group to schedule initial outreach. Strongly advised to contact within the next few days. Requested assistance with contacting the [employer]. Reports documents were submitted by her PCP however the [employer] team is requesting clarification regarding her status as well as expected return to work date. Thorough  discussion with the [employer] today. The team agreed to submit a new package to the clinic. Portions of the documentation will require updated review and signature by the PCP. Some forms will require review and recommendation by her Behavioral Health/Counseling team. Patient verbalized plan to schedule outreach with [facility]. Agreed to contact the team if she requires assistance before completing counseling.   Patient Goals/Self-Care Activities: Take all medications as prescribed Attend all scheduled provider appointments Call pharmacy for medication refills 3-7 days in advance of running out of medications Attend church or other social activities Perform all self care activities independently  Perform IADL's (shopping, preparing meals, housekeeping, managing finances) independently Call provider office for new concerns or questions    Follow Up Plan:   Will follow up next week      PLAN:  Will follow up next week.   [facility] Care Management [facility] [phone]

## 2021-09-11 ENCOUNTER — Ambulatory Visit: Payer: BC Managed Care – PPO

## 2021-09-11 DIAGNOSIS — E7800 Pure hypercholesterolemia, unspecified: Secondary | ICD-10-CM

## 2021-09-11 DIAGNOSIS — F4329 Adjustment disorder with other symptoms: Secondary | ICD-10-CM

## 2021-09-11 DIAGNOSIS — E1165 Type 2 diabetes mellitus with hyperglycemia: Secondary | ICD-10-CM

## 2021-09-11 NOTE — Chronic Care Management (AMB) (Signed)
Care Management    RN Visit Note  09/11/2021 Name: Madison Ritter MRN: [id number] DOB: 1961-02-07  Subjective: Madison Ritter is a 61 y.o. year old female who is a primary care patient of Madison Sprout, FNP. The care management team was consulted for assistance with disease management and care coordination needs.    Engaged with patient by telephone for follow up visit in response to provider referral for case management and care coordination services.   Consent to Services:   [name] was given information about Care Management services including:  Care Management services includes personalized support from designated clinical staff supervised by her physician, including individualized plan of care and coordination with other care providers 24/7 contact phone numbers for assistance for urgent and routine care needs. The patient may stop case management services at any time by phone call to the office staff.  Patient agreed to services and consent obtained.   Assessment: Review of patient past medical history, allergies, medications, health status, including review of consultants reports, laboratory and other test data, was performed as part of comprehensive evaluation and provision of chronic care management services.   SDOH (Social Determinants of Health) assessments and interventions performed: No Care Plan  Allergies  Allergen Reactions   Sulfa Antibiotics Hives    Outpatient Encounter Medications as of 09/11/2021  Medication Sig   acetaminophen (TYLENOL) 500 MG tablet Take 1,000 mg by mouth every 6 (six) hours as needed (for pain.).   amLODipine (NORVASC) 10 MG tablet TAKE 1 TABLET(10 MG) BY MOUTH DAILY   atorvastatin (LIPITOR) 20 MG tablet Take 1 tablet (20 mg total) by mouth daily.   clobetasol ointment (TEMOVATE) 8.10 % Apply 1 application topically 2 (two) times daily.   estradiol (ESTRACE) 0.1 MG/GM vaginal cream INSERT 1 APPLICATORFUL VAGINALLY AT BEDTIME   metFORMIN  (GLUCOPHAGE) 500 MG tablet TAKE 2 TABLETS(1000 MG) BY MOUTH DAILY WITH BREAKFAST   metoprolol succinate (TOPROL-XL) 50 MG 24 hr tablet Take 1 tablet (50 mg total) by mouth daily. TAKE WITH OR IMMEDIATELY FOLLOWING A MEAL.   traZODone (DESYREL) 50 MG tablet Take 0.5-1 tablets (25-50 mg total) by mouth at bedtime as needed for sleep.   valsartan-hydrochlorothiazide (DIOVAN-HCT) 160-25 MG tablet TAKE 1 TABLET BY MOUTH DAILY   Vitamin D, Ergocalciferol, (DRISDOL) 1.25 MG (50000 UNIT) CAPS capsule Take 1 capsule (50,000 Units total) by mouth every 7 (seven) days.   No facility-administered encounter medications on file as of 09/11/2021.    Patient Active Problem List   Diagnosis Date Noted   Positive screening for depression on 9-item Patient Health Questionnaire (PHQ-9) 07/05/2021   Patchy loss of skin color 07/05/2021   Type 2 diabetes mellitus with hyperglycemia, without long-term current use of insulin ([place]) 07/05/2021   Hypertension associated with diabetes ([place]) 07/05/2021   Stress and adjustment reaction 01/15/2021   Type 2 diabetes mellitus without complication, without long-term current use of insulin ([place]) 01/15/2021   Flu vaccine need 01/15/2021   Psychophysiological insomnia 01/15/2021   Gynecomastia 01/15/2021   Caregiver burden 10/13/2020   Hematuria 08/18/2015   Obese 01/11/2015   Hypercholesteremia 01/11/2015   Vitamin D deficiency, unspecified 01/11/2015   Current tobacco use 02/13/2009    Patient Care Plan: RN Care Management Plan of Care     Problem Identified: HTN, Hypercholesterolemia, DM and Stress/Adjustment Disorder      Long-Range Goal: Disease Progression Prevented or Minimized Completed 09/11/2021  Priority: High  Note:   Current Barriers:  Care Management support and  education needs related to HTN, HLD, DMII and Stress and Adjustment Disorder  RNCM Clinical Goal(s):  Patient will demonstrate Improved adherence to prescribed treatment plan for HTN, HLD,  DMII and Stress and Adjustment Disorder through collaboration with the provider, RN Care Manager and the care team.   Interventions: 1:1 collaboration with primary care provider regarding development and update of comprehensive plan of care as evidenced by provider attestation and co-signature Inter-disciplinary care team collaboration (see longitudinal plan of care) Evaluation of current treatment plan related to  self management and patient's adherence to plan as established by provider   Diabetes Interventions: Reviewed plan for diabetes management. Patient reports compliance with treatment plan.  Declined need for additional interventions.   Hyperlipidemia Interventions:   Reviewed plan for management of hyperlipidemia. Reports taking medications as prescribed and adhering to plan. Reports improved motivation to remain active. Reports overall doing well. Declined need for additional interventions  Hypertension Interventions:   Reviewed plan for hypertension management. Reports adherence with medications and treatment plan. Reports improvement since returning to her routine. She is motivated to adhere to plan and maintain her health. Reviewed worsening symptoms that require immediate medical attention. Declined need for additional interventions.  Stress/Adjustment Disorder: (Goal Met) Discussed plan for stress and grief related to recent loss of spouse. Reports experiencing continued grief and lack of focus since losing her spouse last month. She is interested in engaging with a Behavioral Health/Counseling group. Denies difficulty performing tasks in the home. Denies suicidal ideations. Reports having good support from family but stressed regarding loss and uncertainty regarding ability to perform when she returns to work. She has submitted documentation for FMLA. Confirmed speaking with the CCM LCSW regarding options for counseling. She was advised to contact [employer] and  Wellness. Reports not contacting the group to schedule initial outreach. Strongly advised to contact within the next few days. Requested assistance with contacting the [employer]. Reports documents were submitted by her PCP however the [place] team is requesting clarification regarding her status as well as expected return to work date. Collaborated with the [employer] today. The team agreed to submit a new package to the clinic. Portions of the documentation will require updated review and signature by the PCP. Some forms will require review and recommendation by her Behavioral Health/Counseling team. Patient verbalized plan to schedule outreach with [facility]. Agreed to contact the team if she requires assistance before completing counseling.  Update 08/01/2021: Follow up regarding discussion with the [employer]. Updated of providers intention to evaluate patient for follow up in the clinic next week. Provider will update documentation for expected return to work date if patient's condition changes.  Discussed plan with patient. Reports receiving a return call from [employer] [facility]. Anticipates being evaluated within the next week. Discussed current symptoms. Reports continuing to adjust. Denies urgent concerns. She is eager to engage with a counseling group. Active Listening during this encounter. Patient will update the team with concerns as needed. Anticipate follow up outreach within the next week. Update 08/03/21: Received call from patient. Reports receiving a call from the [employer] and speaking with her work Librarian, academic. Reports additional documentation is required to confirm return to work date. The team is still processing her request for FMLA. Contacted the [employer] regarding specific needs. They are aware that she will be evaluated in the clinic next week. Information relayed to patient's PCP. A provider letter was faxed to the [place]  today specifying patient's recommended return to work date on 08/27/21.   Updated information relayed to patient. Advised to follow up as scheduled next week for follow up clinic evaluation. Agreed to call or contact the clinic if additional information/documentation is requested. Update 08/17/21: Patient called to inform the team that the [facility] Group has requested additional information regarding FMLA and leave dates. Requesting assistance from the team. Called placed to the [employer]. Thorough discussion regarding documents currently on file and submitted by patient's PCP. Discussed letter that was submitted on 08/03/21 regarding anticipated return to work date. The staff is aware that the date remains as submitted and was not changed following the patient's clinic visit last month. [employer] staff reports that additional information will be faxed to the clinic if required. Fax number confirmed. Reports that the patient will still need to call and speak with a [employer] staff to confirm information submitted by the clinic. Discuss updates with patient. Informed of need to contact the [employer] to verify all needed information to prevent delays in documents being processed. Patient will attempt to follow up with the group today. Aware that the [employer] may not be available d/t end of business day. Agreed to follow up early Monday morning if she is unable to contact a [employer] today. Update 09/11/21: Patient reports doing well. She has returned to work. Reports she will contact the team at [facility] as needed but will likely transition to a counseling group within her insurance network. She is very motivated to reconnect and engage in community activities. Agreed to call if her condition changes and additional outreach is required.   Patient Goals/Self-Care Activities: Take all medications as prescribed Attend all scheduled provider appointments Call pharmacy for medication  refills 3-7 days in advance of running out of medications Attend church or other social activities Perform all self care activities independently  Perform IADL's (shopping, preparing meals, housekeeping, managing finances) independently Call provider office for new concerns or questions       PLAN Mrs. Qualls will contact the clinic if additional outreach is required.  The care team will gladly assist.   [facility] Care Management [phone]

## 2021-09-19 DIAGNOSIS — L720 Epidermal cyst: Secondary | ICD-10-CM | POA: Diagnosis not present

## 2021-09-19 DIAGNOSIS — L80 Vitiligo: Secondary | ICD-10-CM | POA: Diagnosis not present

## 2021-10-31 ENCOUNTER — Ambulatory Visit: Payer: Self-pay

## 2021-10-31 NOTE — Chronic Care Management (AMB) (Signed)
   10/31/2021  Madison Ritter 1960/11/02 [id number]  Documentation encounter created to complete case transition. Pending PCP appointment rescheduled as requested. The care management team will continue to follow [name] for care coordination.   [facility] Care Management [phone]

## 2021-11-02 ENCOUNTER — Ambulatory Visit: Payer: Self-pay

## 2021-11-02 NOTE — Patient Outreach (Signed)
  Care Coordination   Initial Visit Note   11/02/2021 Name: Nilani Monk MRN: [id number] DOB: Oct 23, 1960  Meghaan Foody is a 61 y.o. year old female who sees Jacky Kindle, FNP for primary care. I spoke with  Porfirio Mylar Buehrer by phone today  What matters to the patients health and wellness today?  Health Maintenance      SDOH assessments and interventions completed:  SDOH Interventions Today    Flowsheet Row Most Recent Value  SDOH Interventions   Food Insecurity Interventions Intervention Not Indicated  Transportation Interventions Intervention Not Indicated        Care Coordination Interventions Activated:  Yes  Care Coordination Interventions:  Yes, provided   Follow up plan: Follow up call scheduled for January 04, 2022    Encounter Outcome:  Pt. Visit Completed    [facility] Care Management [phone]

## 2021-11-02 NOTE — Patient Instructions (Signed)
Visit Information Thank you for allowing the Care Management team to participate in your care. It was great speaking with you today!  Following are the goals we discussed today:         We will follow up on January 04, 2022 at noon. Please don't hesitate to call if you require assistance prior to our next outreach.     The patient verbalized understanding of instructions, educational materials, and care plan provided today and DECLINED offer to receive copy of patient instructions, educational materials, and care plan.

## 2021-11-06 ENCOUNTER — Encounter: Payer: BC Managed Care – PPO | Admitting: Family Medicine

## 2021-12-05 NOTE — Progress Notes (Unsigned)
I,Madison Ritter,acting as a Education administrator for Madison Sprout, FNP.,have documented all relevant documentation on the behalf of Madison Sprout, FNP,as directed by  Madison Sprout, FNP while in the presence of Madison Sprout, FNP.  Complete physical exam   Patient: Madison Ritter   DOB: January 14, 1961   61 y.o. Female  MRN: [id number] Visit Date: 12/06/2021  Today's healthcare provider: Gwyneth Sprout, FNP  Re Introduced to nurse practitioner role and practice setting.  All questions answered.  Discussed provider/patient relationship and expectations.   Chief Complaint  Patient presents with   Annual Exam   Subjective    Madison Ritter is a 61 y.o. female who presents today for a complete physical exam.  She reports consuming a general diet. The patient does not participate in regular exercise at present. She generally feels fairly well. She reports sleeping fairly well. She does not have additional problems to discuss today.  HPI    Past Medical History:  Diagnosis Date   Hypertension    Past Surgical History:  Procedure Laterality Date   ABDOMINAL HYSTERECTOMY  2004   BREAST DUCTAL SYSTEM EXCISION Left 04/14/2019   Procedure: EXCISION DUCTAL SYSTEM BREAST - NO SENTINEL NODE;  Surgeon: Madison Bacon, MD;  Location: [facility] ORS;  Service: General;  Laterality: Left;   BREAST EXCISIONAL BIOPSY Left 03/2019   EPIDERMAL INCLUSION CYST.   TUBAL LIGATION     Social History   Socioeconomic History   Marital status: Widowed    Spouse name: Not on file   Number of children: 1   Years of education: Not on file   Highest education level: Not on file  Occupational History   Not on file  Tobacco Use   Smoking status: Every Day    Packs/day: 0.10    Years: 10.00    Total pack years: 1.00    Types: Cigars, Cigarettes   Smokeless tobacco: Never   Tobacco comments:    1 cigar daily  Vaping Use   Vaping Use: Never used  Substance and Sexual Activity   Alcohol use: Yes    Alcohol/week: 2.0  standard drinks of alcohol    Types: 2 Standard drinks or equivalent per week   Drug use: No   Sexual activity: Not on file  Other Topics Concern   Not on file  Social History Narrative   Not on file   Social Determinants of Health   Financial Resource Strain: Not on file  Food Insecurity: No Food Insecurity (11/02/2021)   Hunger Vital Sign    Worried About Running Out of Food in the Last Year: Never true    Ran Out of Food in the Last Year: Never true  Transportation Needs: No Transportation Needs (11/02/2021)   PRAPARE - Hydrologist (Medical): No    Lack of Transportation (Non-Medical): No  Physical Activity: Not on file  Stress: Stress Concern Present (07/26/2021)   [place]    Feeling of Stress : Very much  Social Connections: Not on file  Intimate Partner Violence: Not on file   Family Status  Relation Name Status   Mother  Alive   Father  Deceased at age 34   Sister  Madison Ritter  (Not Specified)   Other gr aunt Alive   Family History  Problem Relation Age of Onset   Diabetes  Mother    Hypertension Mother    Congestive Heart Failure Mother    Sleep apnea Mother    Stroke Mother    Hypertension Father    Head & neck cancer Father    Thyroid disease Sister    Hypertension Brother    Stroke Brother    Hypertension Brother    Breast cancer Maternal Aunt 66   Breast cancer Other    Allergies  Allergen Reactions   Sulfa Antibiotics Hives    Patient Care Team: Madison Sprout, FNP as PCP - General (Family Medicine)   Medications: Outpatient Medications Prior to Visit  Medication Sig   acetaminophen (TYLENOL) 500 MG tablet Take 1,000 mg by mouth every 6 (six) hours as needed (for pain.).   amLODipine (NORVASC) 10 MG tablet TAKE 1 TABLET(10 MG) BY MOUTH DAILY   atorvastatin (LIPITOR) 20 MG tablet Take 1 tablet (20 mg total) by  mouth daily.   clobetasol ointment (TEMOVATE) 4.03 % Apply 1 application topically 2 (two) times daily.   metFORMIN (GLUCOPHAGE) 500 MG tablet TAKE 2 TABLETS(1000 MG) BY MOUTH DAILY WITH BREAKFAST   metoprolol succinate (TOPROL-XL) 50 MG 24 hr tablet Take 1 tablet (50 mg total) by mouth daily. TAKE WITH OR IMMEDIATELY FOLLOWING A MEAL.   valsartan-hydrochlorothiazide (DIOVAN-HCT) 160-25 MG tablet TAKE 1 TABLET BY MOUTH DAILY   [DISCONTINUED] estradiol (ESTRACE) 0.1 MG/GM vaginal cream INSERT 1 APPLICATORFUL VAGINALLY AT BEDTIME   [DISCONTINUED] traZODone (DESYREL) 50 MG tablet Take 0.5-1 tablets (25-50 mg total) by mouth at bedtime as needed for sleep.   [DISCONTINUED] Vitamin D, Ergocalciferol, (DRISDOL) 1.25 MG (50000 UNIT) CAPS capsule Take 1 capsule (50,000 Units total) by mouth every 7 (seven) days.   No facility-administered medications prior to visit.    Review of Systems  Constitutional:  Positive for fatigue.  HENT:  Positive for congestion.   Eyes:  Positive for discharge.  Respiratory:  Positive for cough.   Skin:  Positive for color change.  All other systems reviewed and are negative.   Last CBC Lab Results  Component Value Date   WBC 9.9 10/13/2020   HGB 14.8 10/13/2020   HCT 47.0 (H) 10/13/2020   MCV 97 10/13/2020   MCH 30.5 10/13/2020   RDW 12.9 10/13/2020   PLT 314 47/42/5956   Last metabolic panel Lab Results  Component Value Date   GLUCOSE 135 (H) 10/13/2020   NA 145 (H) 10/13/2020   K 3.6 10/13/2020   CL 103 10/13/2020   CO2 22 10/13/2020   BUN 15 10/13/2020   CREATININE 0.77 10/13/2020   EGFR 89 10/13/2020   CALCIUM 10.0 10/13/2020   PROT 7.1 10/13/2020   ALBUMIN 4.3 10/13/2020   LABGLOB 2.8 10/13/2020   AGRATIO 1.5 10/13/2020   BILITOT 0.3 10/13/2020   ALKPHOS 169 (H) 10/13/2020   AST 11 10/13/2020   ALT 27 10/13/2020   ANIONGAP 10 04/12/2019   Last lipids Lab Results  Component Value Date   CHOL 201 (H) 10/13/2020   HDL 63 10/13/2020    LDLCALC 117 (H) 10/13/2020   TRIG 121 10/13/2020   CHOLHDL 3.8 03/15/2020   Last hemoglobin A1c Lab Results  Component Value Date   HGBA1C 7.3 (H) 07/05/2021   Last thyroid functions Lab Results  Component Value Date   TSH 0.289 (L) 10/13/2020   Last vitamin D Lab Results  Component Value Date   VD25OH 13.4 (L) 10/13/2020      Objective     BP (!) 135/90 (  BP Location: Left Arm, Patient Position: Sitting, Cuff Size: Large)   Pulse 80   Temp 98.5 F (36.9 C) (Oral)   Resp 16   Ht 5' 4"  (1.626 m)   Wt 187 lb 8 oz (85 kg)   BMI 32.18 kg/m  BP Readings from Last 3 Encounters:  12/06/21 (!) 135/90  08/07/21 130/90  07/05/21 (!) 158/102   Wt Readings from Last 3 Encounters:  12/06/21 187 lb 8 oz (85 kg)  08/07/21 187 lb 1.6 oz (84.9 kg)  07/05/21 183 lb (83 kg)     Physical Exam Vitals and nursing note reviewed.  Constitutional:      General: She is awake. She is not in acute distress.    Appearance: Normal appearance. She is well-developed and well-groomed. She is obese. She is not ill-appearing, toxic-appearing or diaphoretic.  HENT:     Head: Normocephalic and atraumatic.     Jaw: There is normal jaw occlusion. No trismus, tenderness, swelling or pain on movement.     Right Ear: Hearing, tympanic membrane, ear canal and external ear normal. There is no impacted cerumen.     Left Ear: Hearing, tympanic membrane, ear canal and external ear normal. There is no impacted cerumen.     Nose: Nose normal. No congestion or rhinorrhea.     Right Turbinates: Not enlarged, swollen or pale.     Left Turbinates: Not enlarged, swollen or pale.     Right Sinus: No maxillary sinus tenderness or frontal sinus tenderness.     Left Sinus: No maxillary sinus tenderness or frontal sinus tenderness.     Mouth/Throat:     Lips: Pink.     Mouth: Mucous membranes are moist. No injury.     Tongue: No lesions.     Pharynx: Oropharynx is clear. Uvula midline. No pharyngeal swelling,  oropharyngeal exudate, posterior oropharyngeal erythema or uvula swelling.     Tonsils: No tonsillar exudate or tonsillar abscesses.  Eyes:     General: Lids are normal. Lids are everted, no foreign bodies appreciated. Vision grossly intact. Gaze aligned appropriately. No allergic shiner or visual field deficit.       Right eye: No discharge.        Left eye: No discharge.     Extraocular Movements: Extraocular movements intact.     Conjunctiva/sclera: Conjunctivae normal.     Right eye: Right conjunctiva is not injected. No exudate.    Left eye: Left conjunctiva is not injected. No exudate.    Pupils: Pupils are equal, round, and reactive to light.  Neck:     Thyroid: No thyroid mass, thyromegaly or thyroid tenderness.     Vascular: No carotid bruit.     Trachea: Trachea normal.  Cardiovascular:     Rate and Rhythm: Normal rate and regular rhythm.     Pulses: Normal pulses.          Carotid pulses are 2+ on the right side and 2+ on the left side.      Radial pulses are 2+ on the right side and 2+ on the left side.       Dorsalis pedis pulses are 2+ on the right side and 2+ on the left side.       Posterior tibial pulses are 2+ on the right side and 2+ on the left side.     Heart sounds: Normal heart sounds, S1 normal and S2 normal. No murmur heard.    No friction rub. No gallop.  Pulmonary:  Effort: Pulmonary effort is normal. No respiratory distress.     Breath sounds: Normal breath sounds and air entry. No stridor. No wheezing, rhonchi or rales.  Chest:     Chest wall: No tenderness.  Abdominal:     General: Abdomen is flat. Bowel sounds are normal. There is no distension.     Palpations: Abdomen is soft. There is no mass.     Tenderness: There is no abdominal tenderness. There is no right CVA tenderness, left CVA tenderness, guarding or rebound.     Hernia: No hernia is present.  Genitourinary:    Comments: Exam deferred; denies complaints Musculoskeletal:        General:  No swelling, tenderness, deformity or signs of injury. Normal range of motion.     Cervical back: Full passive range of motion without pain, normal range of motion and neck supple. No edema, rigidity or tenderness. No muscular tenderness.     Right lower leg: No edema.     Left lower leg: No edema.  Lymphadenopathy:     Cervical: No cervical adenopathy.     Right cervical: No superficial, deep or posterior cervical adenopathy.    Left cervical: No superficial, deep or posterior cervical adenopathy.  Skin:    General: Skin is warm and dry.     Capillary Refill: Capillary refill takes less than 2 seconds.     Coloration: Skin is not jaundiced or pale.     Findings: No bruising, erythema, lesion or rash.  Neurological:     General: No focal deficit present.     Mental Status: She is alert and oriented to person, place, and time. Mental status is at baseline.     GCS: GCS eye subscore is 4. GCS verbal subscore is 5. GCS motor subscore is 6.     Sensory: Sensation is intact. No sensory deficit.     Motor: Motor function is intact. No weakness.     Coordination: Coordination is intact. Coordination normal.     Gait: Gait is intact. Gait normal.  Psychiatric:        Attention and Perception: Attention and perception normal.        Mood and Affect: Mood and affect normal.        Speech: Speech normal.        Behavior: Behavior normal. Behavior is cooperative.        Thought Content: Thought content normal.        Cognition and Memory: Cognition and memory normal.        Judgment: Judgment normal.      Last depression screening scores    12/06/2021   10:52 AM 07/26/2021   10:15 AM 07/05/2021    3:45 PM  PHQ 2/9 Scores  PHQ - 2 Score 2 2 2   PHQ- 9 Score 8 6 10    Last fall risk screening    12/06/2021   10:52 AM  Fall Risk   Falls in the past year? 0  Number falls in past yr: 0  Injury with Fall? 0  Risk for fall due to : No Fall Risks  Follow up Falls evaluation completed    Last Audit-C alcohol use screening    12/06/2021   10:53 AM  Alcohol Use Disorder Test (AUDIT)  1. How often do you have a drink containing alcohol? 2  2. How many drinks containing alcohol do you have on a typical day when you are drinking? 0  3. How often do you have  six or more drinks on one occasion? 0  AUDIT-C Score 2   A score of 3 or more in women, and 4 or more in men indicates increased risk for alcohol abuse, EXCEPT if all of the points are from question 1   No results found for any visits on 12/06/21.  Assessment & Plan    Routine Health Maintenance and Physical Exam  Exercise Activities and Dietary recommendations  Goals      Health Maintenance     Care Coordination Interventions:      Manage My Emotions     Timeframe:  Long-Range Goal Priority:  Medium Start Date:       07/26/21                      Expected End Date:      08/30/21                 - continue with begin personal counseling if there is a continued need - join a support group if needed - continue to talk about feelings with a friend, family or spiritual advisor - practice positive thinking and self-talk    Why is this important?   When you are stressed, down or upset, your body reacts too.  For example, your blood pressure may get higher; you may have a headache or stomachache.  When your emotions get the best of you, your body's ability to fight off cold and flu gets weak.  These steps will help you manage your emotions.     Notes:         Immunization History  Administered Date(s) Administered   Influenza,inj,Quad PF,6+ Mos 11/29/2013, 01/09/2017, 01/07/2019, 03/15/2020, 01/15/2021   Moderna Sars-Covid-2 Vaccination 05/24/2019, 06/21/2019   Pneumococcal Polysaccharide-23 08/31/2015   Tdap 08/18/2015    Health Maintenance  Topic Date Due   Zoster Vaccines- Shingrix (1 of 2) Never done   OPHTHALMOLOGY EXAM  04/14/2018   COVID-19 Vaccine (3 - Moderna risk series) 07/19/2019   FOOT  EXAM  01/07/2020   HEMOGLOBIN A1C  10/04/2021   Diabetic kidney evaluation - GFR measurement  10/13/2021   INFLUENZA VACCINE  06/16/2022 (Originally 10/16/2021)   Diabetic kidney evaluation - Urine ACR  07/06/2022   MAMMOGRAM  08/01/2023   COLONOSCOPY (Pts 45-51yr Insurance coverage will need to be confirmed)  05/02/2024   PAP SMEAR-Modifier  03/15/2025   TETANUS/TDAP  08/17/2025   Hepatitis C Screening  Completed   HIV Screening  Completed   HPV VACCINES  Aged Out    Discussed health benefits of physical activity, and encouraged her to engage in regular exercise appropriate for her age and condition.  Problem List Items Addressed This Visit       Cardiovascular and Mediastinum   Hypertension associated with diabetes (HAugust    Chronic, elevated Denies CP Denies SOB/ DOE Denies low blood pressure/hypotension Denies vision changes No LE Edema noted on exam Continue medication, Norvasc 10 mg, Toprol 50, Diovan 160-25 Denies side effects Seek emergent care if you develop chest pain or chest pressure       Relevant Orders   Comprehensive metabolic panel     Endocrine   Hyperlipidemia associated with type 2 diabetes mellitus (HCC)    Chronic, previously elevated at 117 Goal of 55-70 Repeat LP Previously on lipitor 20 mg        Relevant Orders   Lipid Panel With LDL/HDL Ratio   Type 2 diabetes mellitus with hyperglycemia, without  long-term current use of insulin (HCC)    Chronic, previously elevated Remains on 1000 mg Metformin BID Continue to recommend balanced, lower carb meals. Smaller meal size, adding snacks. Choosing water as drink of choice and increasing purposeful exercise.       Relevant Orders   Microalbumin / creatinine urine ratio   Hemoglobin A1c     Other   Encounter for annual health examination - Primary    Due for dental and vision Things to do to keep yourself healthy  - Exercise at least 30-45 minutes a day, 3-4 days a week.  - Eat a low-fat  diet with lots of fruits and vegetables, up to 7-9 servings per day.  - Seatbelts can save your life. Wear them always.  - Smoke detectors on every level of your home, check batteries every year.  - Eye Doctor - have an eye exam every 1-2 years  - Safe sex - if you may be exposed to STDs, use a condom.  - Alcohol -  If you drink, do it moderately, less than 2 drinks per day.  - [place]. Choose someone to speak for you if you are not able.  - Depression is common in our stressful world.If you're feeling down or losing interest in things you normally enjoy, please come in for a visit.  - Violence - If anyone is threatening or hurting you, please call immediately.        Relevant Orders   Comprehensive metabolic panel   Lipid Panel With LDL/HDL Ratio   VITAMIN D 25 Hydroxy (Vit-D Deficiency, Fractures)   Microalbumin / creatinine urine ratio   CBC with Differential/Platelet   TSH + free T4   Need for influenza vaccination    Consented; VIS made available; no immediate side effects following administration; plan to repeat annually        Vitamin D deficiency, unspecified    Chronic, previously low on high dose supplement Repeat Vit D levels       Relevant Orders   VITAMIN D 25 Hydroxy (Vit-D Deficiency, Fractures)     Return in about 6 months (around 06/06/2022) for chonic disease management.     Vonna Kotyk, FNP, have reviewed all documentation for this visit. The documentation on 12/06/21 for the exam, diagnosis, procedures, and orders are all accurate and complete.    Madison Ritter, [place] [phone] (phone) [phone] (fax)  [place]

## 2021-12-06 ENCOUNTER — Encounter: Payer: Self-pay | Admitting: Family Medicine

## 2021-12-06 ENCOUNTER — Ambulatory Visit (INDEPENDENT_AMBULATORY_CARE_PROVIDER_SITE_OTHER): Payer: BC Managed Care – PPO | Admitting: Family Medicine

## 2021-12-06 VITALS — BP 135/90 | HR 80 | Temp 98.5°F | Resp 16 | Ht 64.0 in | Wt 187.5 lb

## 2021-12-06 DIAGNOSIS — E1165 Type 2 diabetes mellitus with hyperglycemia: Secondary | ICD-10-CM | POA: Diagnosis not present

## 2021-12-06 DIAGNOSIS — I152 Hypertension secondary to endocrine disorders: Secondary | ICD-10-CM

## 2021-12-06 DIAGNOSIS — E785 Hyperlipidemia, unspecified: Secondary | ICD-10-CM

## 2021-12-06 DIAGNOSIS — Z23 Encounter for immunization: Secondary | ICD-10-CM | POA: Diagnosis not present

## 2021-12-06 DIAGNOSIS — E1169 Type 2 diabetes mellitus with other specified complication: Secondary | ICD-10-CM

## 2021-12-06 DIAGNOSIS — E7800 Pure hypercholesterolemia, unspecified: Secondary | ICD-10-CM | POA: Diagnosis not present

## 2021-12-06 DIAGNOSIS — E559 Vitamin D deficiency, unspecified: Secondary | ICD-10-CM | POA: Diagnosis not present

## 2021-12-06 DIAGNOSIS — Z0000 Encounter for general adult medical examination without abnormal findings: Secondary | ICD-10-CM | POA: Diagnosis not present

## 2021-12-06 DIAGNOSIS — E1159 Type 2 diabetes mellitus with other circulatory complications: Secondary | ICD-10-CM

## 2021-12-06 NOTE — Assessment & Plan Note (Signed)
Chronic, previously elevated Remains on 1000 mg Metformin BID Continue to recommend balanced, lower carb meals. Smaller meal size, adding snacks. Choosing water as drink of choice and increasing purposeful exercise.

## 2021-12-06 NOTE — Assessment & Plan Note (Signed)
Chronic, previously low on high dose supplement Repeat Vit D levels

## 2021-12-06 NOTE — Assessment & Plan Note (Signed)
Chronic, elevated Denies CP Denies SOB/ DOE Denies low blood pressure/hypotension Denies vision changes No LE Edema noted on exam Continue medication, Norvasc 10 mg, Toprol 50, Diovan 160-25 Denies side effects Seek emergent care if you develop chest pain or chest pressure

## 2021-12-06 NOTE — Assessment & Plan Note (Signed)
Due for dental and vision Things to do to keep yourself healthy  - Exercise at least 30-45 minutes a day, 3-4 days a week.  - Eat a low-fat diet with lots of fruits and vegetables, up to 7-9 servings per day.  - Seatbelts can save your life. Wear them always.  - Smoke detectors on every level of your home, check batteries every year.  - Eye Doctor - have an eye exam every 1-2 years  - Safe sex - if you may be exposed to STDs, use a condom.  - Alcohol -  If you drink, do it moderately, less than 2 drinks per day.  - Health Care Power of Attorney. Choose someone to speak for you if you are not able.  - Depression is common in our stressful world.If you're feeling down or losing interest in things you normally enjoy, please come in for a visit.  - Violence - If anyone is threatening or hurting you, please call immediately.  

## 2021-12-06 NOTE — Assessment & Plan Note (Signed)
Consented; VIS made available; no immediate side effects following administration; plan to repeat annually   

## 2021-12-06 NOTE — Assessment & Plan Note (Signed)
Chronic, previously elevated at 117 Goal of 55-70 Repeat LP Previously on lipitor 20 mg

## 2021-12-06 NOTE — Patient Instructions (Signed)
The CDC recommends two doses of Shingrix (the shingles vaccine) separated by 2 to 6 months for adults age 59 years and older. 

## 2021-12-07 ENCOUNTER — Other Ambulatory Visit: Payer: Self-pay | Admitting: Family Medicine

## 2021-12-07 DIAGNOSIS — E559 Vitamin D deficiency, unspecified: Secondary | ICD-10-CM

## 2021-12-07 LAB — MICROALBUMIN / CREATININE URINE RATIO
Creatinine, Urine: 43.8 mg/dL
Microalb/Creat Ratio: <7 mg/g creat (ref 0–29)
Microalbumin, Urine: <3 ug/mL

## 2021-12-07 LAB — CBC WITH DIFFERENTIAL/PLATELET
Basophils Absolute: 0.1 10*3/uL (ref 0.0–0.2)
Basos: 1 %
EOS (ABSOLUTE): 0.2 10*3/uL (ref 0.0–0.4)
Eos: 2 %
Hematocrit: 41 % (ref 34.0–46.6)
Hemoglobin: 13.8 g/dL (ref 11.1–15.9)
Immature Grans (Abs): 0 10*3/uL (ref 0.0–0.1)
Immature Granulocytes: 0 %
Lymphocytes Absolute: 3.4 10*3/uL — ABNORMAL HIGH (ref 0.7–3.1)
Lymphs: 29 %
MCH: 30.7 pg (ref 26.6–33.0)
MCHC: 33.7 g/dL (ref 31.5–35.7)
MCV: 91 fL (ref 79–97)
Monocytes Absolute: 0.8 10*3/uL (ref 0.1–0.9)
Monocytes: 7 %
Neutrophils Absolute: 7.1 10*3/uL — ABNORMAL HIGH (ref 1.4–7.0)
Neutrophils: 61 %
Platelets: 343 10*3/uL (ref 150–450)
RBC: 4.5 x10E6/uL (ref 3.77–5.28)
RDW: 12.1 % (ref 11.7–15.4)
WBC: 11.6 10*3/uL — ABNORMAL HIGH (ref 3.4–10.8)

## 2021-12-07 LAB — VITAMIN D 25 HYDROXY (VIT D DEFICIENCY, FRACTURES): Vit D, 25-Hydroxy: 14.4 ng/mL — ABNORMAL LOW (ref 30.0–100.0)

## 2021-12-07 LAB — COMPREHENSIVE METABOLIC PANEL
ALT: 22 IU/L (ref 0–32)
AST: 15 IU/L (ref 0–40)
Albumin/Globulin Ratio: 1.4 (ref 1.2–2.2)
Albumin: 4 g/dL (ref 3.8–4.9)
Alkaline Phosphatase: 120 IU/L (ref 44–121)
BUN/Creatinine Ratio: 13 (ref 9–23)
BUN: 9 mg/dL (ref 6–24)
Bilirubin Total: 0.4 mg/dL (ref 0.0–1.2)
CO2: 22 mmol/L (ref 20–29)
Calcium: 9.9 mg/dL (ref 8.7–10.2)
Chloride: 102 mmol/L (ref 96–106)
Creatinine, Ser: 0.71 mg/dL (ref 0.57–1.00)
Globulin, Total: 2.9 g/dL (ref 1.5–4.5)
Glucose: 168 mg/dL — ABNORMAL HIGH (ref 70–99)
Potassium: 3.7 mmol/L (ref 3.5–5.2)
Sodium: 140 mmol/L (ref 134–144)
Total Protein: 6.9 g/dL (ref 6.0–8.5)
eGFR: 98 mL/min/{1.73_m2} (ref >59)

## 2021-12-07 LAB — LIPID PANEL WITH LDL/HDL RATIO
Cholesterol, Total: 162 mg/dL (ref 100–199)
HDL: 62 mg/dL (ref >39)
LDL Chol Calc (NIH): 80 mg/dL (ref 0–99)
LDL/HDL Ratio: 1.3 ratio (ref 0.0–3.2)
Triglycerides: 111 mg/dL (ref 0–149)
VLDL Cholesterol Cal: 20 mg/dL (ref 5–40)

## 2021-12-07 LAB — TSH+FREE T4
Free T4: 1.44 ng/dL (ref 0.82–1.77)
TSH: 0.536 u[IU]/mL (ref 0.450–4.500)

## 2021-12-07 LAB — HEMOGLOBIN A1C
Est. average glucose Bld gHb Est-mCnc: 171 mg/dL
Hgb A1c MFr Bld: 7.6 % — ABNORMAL HIGH (ref 4.8–5.6)

## 2021-12-07 MED ORDER — VITAMIN D (ERGOCALCIFEROL) 1.25 MG (50000 UNIT) PO CAPS: 50000.0000 [IU] | ORAL_CAPSULE | ORAL | 0 refills | Status: DC

## 2021-12-07 NOTE — Progress Notes (Signed)
Blood chemistry continues to show elevated glucose levels. A1c has increased to 7.6%. Continue to recommend balanced, lower carb meals. Smaller meal size, adding snacks. Choosing water as drink of choice and increasing purposeful exercise. Recommend increase of metformin to twice daily dosing to assist in reduction and reduce risk for progression.   Vit D remains very low; continue to recommend high dose supplement weekly for 6 months.  Cholesterol shows improved total and bad/LDL cholesterol. The 10-year ASCVD risk score (Arnett DK, et al., 2019) is: 24.6%   Values used to calculate the score:     Age: 61 years     Sex: Female     Is Non-Hispanic African American: Yes     Diabetic: Yes     Tobacco smoker: Yes     Systolic Blood Pressure: 981 mmHg     Is BP treated: Yes     HDL Cholesterol: 62 mg/dL     Total Cholesterol: 162 mg/dL Heart attack and stroke risk is 25% estimated within the next 10 years which is elevated. Recommend increase in lipitor to 80 mg to assist with risk reduction. Continue to recommend diet and exercise as well as blood pressure control and smoking cessation.  Gwyneth Sprout, [address] [place], [place] [address] [phone] (phone) [phone] (fax) [place]

## 2021-12-12 ENCOUNTER — Other Ambulatory Visit: Payer: Self-pay | Admitting: Family Medicine

## 2021-12-12 DIAGNOSIS — E119 Type 2 diabetes mellitus without complications: Secondary | ICD-10-CM

## 2021-12-12 MED ORDER — METFORMIN HCL 500 MG PO TABS: 1000.0000 mg | ORAL_TABLET | Freq: Two times a day (BID) | ORAL | 1 refills | Status: DC

## 2021-12-12 MED ORDER — ATORVASTATIN CALCIUM 80 MG PO TABS: 80.0000 mg | ORAL_TABLET | Freq: Every day | ORAL | 3 refills | Status: DC

## 2021-12-31 ENCOUNTER — Ambulatory Visit: Payer: BC Managed Care – PPO | Admitting: Dermatology

## 2022-01-04 ENCOUNTER — Ambulatory Visit: Payer: Self-pay

## 2022-01-21 DIAGNOSIS — L821 Other seborrheic keratosis: Secondary | ICD-10-CM | POA: Diagnosis not present

## 2022-01-21 DIAGNOSIS — L728 Other follicular cysts of the skin and subcutaneous tissue: Secondary | ICD-10-CM | POA: Diagnosis not present

## 2022-01-21 DIAGNOSIS — L80 Vitiligo: Secondary | ICD-10-CM | POA: Diagnosis not present

## 2022-06-06 NOTE — Progress Notes (Unsigned)
I,J'ya E Emree Locicero,acting as a scribe for Gwyneth Sprout, FNP.,have documented all relevant documentation on the behalf of Gwyneth Sprout, FNP,as directed by  Gwyneth Sprout, FNP while in the presence of Gwyneth Sprout, FNP.   Established patient visit   Patient: Madison Ritter   DOB: 11/16/1960   62 y.o. Female  MRN: [id number] Visit Date: 06/07/2022  Today's healthcare provider: Gwyneth Sprout, FNP  Re Introduced to nurse practitioner role and practice setting.  All questions answered.  Discussed provider/patient relationship and expectations.   Chief Complaint  Patient presents with   Diabetes   Hypertension   Subjective    HPI  Diabetes Mellitus Type II, Follow-up  Lab Results  Component Value Date   HGBA1C 7.5 (H) 06/07/2022   HGBA1C 7.6 (H) 12/06/2021   HGBA1C 7.3 (H) 07/05/2021   Wt Readings from Last 3 Encounters:  06/07/22 186 lb 4.8 oz (84.5 kg)  12/06/21 187 lb 8 oz (85 kg)  08/07/21 187 lb 1.6 oz (84.9 kg)   Last seen for diabetes 6 months ago.  Management since then includes 1000 mg Metformin BID and lifestyle changes. She reports fair compliance with treatment. Pt reported that she does not eat at night, so is only taking the medication once a day.  She is having side effects. Patient reported that when taking the medication on an empty hurts her stomach.   Symptoms:  Yes fatigue Yes foot ulcerations  Yes appetite changes No nausea  No paresthesia of the feet  No polydipsia  No polyuria No visual disturbances   No vomiting    Pt reports that she has a sore spot on her right foot and believe she has "arthritis" on her left foot.    Episodes of hypoglycemia? Yes, patient says she "feels a way" and knows when to increase sugar intake.    Current insulin regiment: none Most Recent Eye Exam:  Pt understands that she will need an eye exam. Pt also expressed need for mammogram.  Current exercise: none Current diet habits: in general, an "unhealthy" diet, on  average, 1 meal  meals per day  Pertinent Labs: Lab Results  Component Value Date   CHOL 208 (H) 06/07/2022   HDL 69 06/07/2022   LDLCALC 112 (H) 06/07/2022   TRIG 156 (H) 06/07/2022   CHOLHDL 3.0 06/07/2022   Lab Results  Component Value Date   NA 141 06/07/2022   K 3.5 06/07/2022   CREATININE 0.73 06/07/2022   EGFR 94 06/07/2022   LABMICR <3.0 12/06/2021   MICRALBCREAT <7 12/06/2021     ---------------------------------------------------------------------------------------------------  Patient understands that she needs Shringrix and COVID vaccine.   Medications: Outpatient Medications Prior to Visit  Medication Sig   acetaminophen (TYLENOL) 500 MG tablet Take 1,000 mg by mouth every 6 (six) hours as needed (for pain.).   atorvastatin (LIPITOR) 80 MG tablet Take 1 tablet (80 mg total) by mouth daily.   metFORMIN (GLUCOPHAGE) 500 MG tablet Take 2 tablets (1,000 mg total) by mouth 2 (two) times daily with a meal.   Vitamin D, Ergocalciferol, (DRISDOL) 1.25 MG (50000 UNIT) CAPS capsule Take 1 capsule (50,000 Units total) by mouth every 7 (seven) days.   [DISCONTINUED] amLODipine (NORVASC) 10 MG tablet TAKE 1 TABLET(10 MG) BY MOUTH DAILY   [DISCONTINUED] clobetasol ointment (TEMOVATE) [id number] % Apply 1 application topically 2 (two) times daily.   [DISCONTINUED] metoprolol succinate (TOPROL-XL) 50 MG 24 hr tablet Take 1 tablet (50 mg total)  by mouth daily. TAKE WITH OR IMMEDIATELY FOLLOWING A MEAL.   [DISCONTINUED] valsartan-hydrochlorothiazide (DIOVAN-HCT) 160-25 MG tablet TAKE 1 TABLET BY MOUTH DAILY   No facility-administered medications prior to visit.    Review of Systems  Last CBC Lab Results  Component Value Date   WBC 11.6 (H) 06/07/2022   HGB 15.0 06/07/2022   HCT 44.4 06/07/2022   MCV 90 06/07/2022   MCH 30.3 06/07/2022   RDW 12.1 06/07/2022   PLT 369 123XX123   Last metabolic panel Lab Results  Component Value Date   GLUCOSE 157 (H) 06/07/2022   NA 141  06/07/2022   K 3.5 06/07/2022   CL 101 06/07/2022   CO2 26 06/07/2022   BUN 11 06/07/2022   CREATININE 0.73 06/07/2022   EGFR 94 06/07/2022   CALCIUM 10.1 06/07/2022   PROT 7.4 06/07/2022   ALBUMIN 4.3 06/07/2022   LABGLOB 3.1 06/07/2022   AGRATIO 1.4 06/07/2022   BILITOT 0.5 06/07/2022   ALKPHOS 163 (H) 06/07/2022   AST 13 06/07/2022   ALT 22 06/07/2022   ANIONGAP 10 04/12/2019   Last lipids Lab Results  Component Value Date   CHOL 208 (H) 06/07/2022   HDL 69 06/07/2022   LDLCALC 112 (H) 06/07/2022   TRIG 156 (H) 06/07/2022   CHOLHDL 3.0 06/07/2022   Last hemoglobin A1c Lab Results  Component Value Date   HGBA1C 7.5 (H) 06/07/2022   Last thyroid functions Lab Results  Component Value Date   TSH 0.536 12/06/2021   Last vitamin D Lab Results  Component Value Date   VD25OH 13.0 (L) 06/07/2022       Objective    BP (!) 170/111 (BP Location: Right Arm, Patient Position: Sitting, Cuff Size: Large)   Pulse 87   Temp 98.7 F (37.1 C) (Oral)   Resp 15   Wt 186 lb 4.8 oz (84.5 kg)   SpO2 97%   BMI 31.98 kg/m  BP Readings from Last 3 Encounters:  06/07/22 (!) 170/111  12/06/21 (!) 135/90  08/07/21 130/90   Wt Readings from Last 3 Encounters:  06/07/22 186 lb 4.8 oz (84.5 kg)  12/06/21 187 lb 8 oz (85 kg)  08/07/21 187 lb 1.6 oz (84.9 kg)   SpO2 Readings from Last 3 Encounters:  06/07/22 97%  07/05/21 95%  02/19/21 100%      Physical Exam Vitals and nursing note reviewed.  Constitutional:      General: She is not in acute distress.    Appearance: Normal appearance. She is obese. She is not ill-appearing, toxic-appearing or diaphoretic.  HENT:     Head: Normocephalic and atraumatic.  Cardiovascular:     Rate and Rhythm: Normal rate and regular rhythm.     Pulses: Normal pulses.     Heart sounds: Normal heart sounds. No murmur heard.    No friction rub. No gallop.  Pulmonary:     Effort: Pulmonary effort is normal. No respiratory distress.      Breath sounds: Normal breath sounds. No stridor. No wheezing, rhonchi or rales.  Chest:     Chest wall: No tenderness.  Musculoskeletal:        General: No swelling, tenderness, deformity or signs of injury. Normal range of motion.     Right lower leg: Edema present.     Left lower leg: Edema present.  Skin:    General: Skin is warm and dry.     Capillary Refill: Capillary refill takes less than 2 seconds.  Coloration: Skin is not jaundiced or pale.     Findings: No bruising, erythema, lesion or rash.  Neurological:     General: No focal deficit present.     Mental Status: She is alert and oriented to person, place, and time. Mental status is at baseline.     Cranial Nerves: No cranial nerve deficit.     Sensory: No sensory deficit.     Motor: No weakness.     Coordination: Coordination normal.  Psychiatric:        Mood and Affect: Mood is depressed. Affect is flat and tearful.        Behavior: Behavior normal.        Thought Content: Thought content normal. Thought content does not include homicidal or suicidal ideation. Thought content does not include homicidal or suicidal plan.        Judgment: Judgment normal.     Results for orders placed or performed in visit on 06/07/22  Comprehensive Metabolic Panel (CMET)  Result Value Ref Range   Glucose 157 (H) 70 - 99 mg/dL   BUN 11 8 - 27 mg/dL   Creatinine, Ser 0.73 0.57 - 1.00 mg/dL   eGFR 94 >59 mL/min/1.73   BUN/Creatinine Ratio 15 12 - 28   Sodium 141 134 - 144 mmol/L   Potassium 3.5 3.5 - 5.2 mmol/L   Chloride 101 96 - 106 mmol/L   CO2 26 20 - 29 mmol/L   Calcium 10.1 8.7 - 10.3 mg/dL   Total Protein 7.4 6.0 - 8.5 g/dL   Albumin 4.3 3.8 - 4.9 g/dL   Globulin, Total 3.1 1.5 - 4.5 g/dL   Albumin/Globulin Ratio 1.4 1.2 - 2.2   Bilirubin Total 0.5 0.0 - 1.2 mg/dL   Alkaline Phosphatase 163 (H) 44 - 121 IU/L   AST 13 0 - 40 IU/L   ALT 22 0 - 32 IU/L  Lipid panel  Result Value Ref Range   Cholesterol, Total 208 (H)  100 - 199 mg/dL   Triglycerides 156 (H) 0 - 149 mg/dL   HDL 69 >39 mg/dL   VLDL Cholesterol Cal 27 5 - 40 mg/dL   LDL Chol Calc (NIH) 112 (H) 0 - 99 mg/dL   Chol/HDL Ratio 3.0 0.0 - 4.4 ratio  Vitamin D (25 hydroxy)  Result Value Ref Range   Vit D, 25-Hydroxy 13.0 (L) 30.0 - 100.0 ng/mL  CBC with Differential/Platelet  Result Value Ref Range   WBC 11.6 (H) 3.4 - 10.8 x10E3/uL   RBC 4.95 3.77 - 5.28 x10E6/uL   Hemoglobin 15.0 11.1 - 15.9 g/dL   Hematocrit 44.4 34.0 - 46.6 %   MCV 90 79 - 97 fL   MCH 30.3 26.6 - 33.0 pg   MCHC 33.8 31.5 - 35.7 g/dL   RDW 12.1 11.7 - 15.4 %   Platelets 369 150 - 450 x10E3/uL   Neutrophils 59 Not Estab. %   Lymphs 29 Not Estab. %   Monocytes 7 Not Estab. %   Eos 4 Not Estab. %   Basos 1 Not Estab. %   Neutrophils Absolute 6.9 1.4 - 7.0 x10E3/uL   Lymphocytes Absolute 3.4 (H) 0.7 - 3.1 x10E3/uL   Monocytes Absolute 0.9 0.1 - 0.9 x10E3/uL   EOS (ABSOLUTE) 0.4 0.0 - 0.4 x10E3/uL   Basophils Absolute 0.1 0.0 - 0.2 x10E3/uL   Immature Granulocytes 0 Not Estab. %   Immature Grans (Abs) 0.0 0.0 - 0.1 x10E3/uL  Hemoglobin A1c  Result Value Ref Range  Hgb A1c MFr Bld 7.5 (H) 4.8 - 5.6 %   Est. average glucose Bld gHb Est-mCnc 169 mg/dL    Assessment & Plan     Problem List Items Addressed This Visit       Cardiovascular and Mediastinum   Hypertension associated with diabetes ([place])    Chronic, previously non adherent to medication plans previously -restart norvasc at 10 mg -restart metop at 50 mg -restart diovan hct 160-25 mg 1 month f/u recommended Goal <130/<80      Relevant Orders   Comprehensive Metabolic Panel (CMET) (Completed)   CBC with Differential/Platelet (Completed)     Endocrine   Hyperlipidemia associated with type 2 diabetes mellitus (HCC)    Chronic, previously elevated LDL goal of 55-70 Recommend restart of lipitor 80 mg in combination with lifestyle change- diet, exercise, tobacco reduction      Relevant Orders    Lipid panel (Completed)   Type 2 diabetes mellitus with hyperglycemia, without long-term current use of insulin (HCC)    Chronic, elevated Continue to recommend balanced, lower carb meals. Smaller meal size, adding snacks. Choosing water as drink of choice and increasing purposeful exercise. Continue to recommend use of metformin 1000 mg bid with meals Repeat A1c in 3 months Encourage eye exam and foot exam at next visit      Relevant Orders   Hemoglobin A1c (Completed)     Other   Current non-adherence to medical treatment - Primary   Current tobacco use    Chronic, elevated Continue to recommend reduction with goals of cessation; patient refers assistance at this time      Severe episode of recurrent major depressive disorder, without psychotic features ([employer])    Chronic, variable Continues to be grieving following loss of her spouse; family is supportive; however, patient shows anhedonia over work and self care  Continues to decline medication to assist; plans to reach back out to her counselor       Relevant Orders   Vitamin D (25 hydroxy) (Completed)   Stress and adjustment reaction   Vitamin D deficiency, unspecified    Chronic, contributing to mood disorder Continue to recommend labs for follow up and need for additional supplementation if indicated      Relevant Orders   Vitamin D (25 hydroxy) (Completed)   Other Visit Diagnoses     Breast cancer screening by mammogram       Relevant Orders   MM 3D SCREENING MAMMOGRAM BILATERAL BREAST        Return in about 4 weeks (around 07/05/2022) for HTN management- new provider .      Vonna Kotyk, FNP, have reviewed all documentation for this visit. The documentation on 06/09/22 for the exam, diagnosis, procedures, and orders are all accurate and complete.    Gwyneth Sprout, [place] [phone] (phone) [phone] (fax)  [place]

## 2022-06-07 ENCOUNTER — Other Ambulatory Visit: Payer: Self-pay | Admitting: Family Medicine

## 2022-06-07 ENCOUNTER — Other Ambulatory Visit: Payer: Self-pay

## 2022-06-07 ENCOUNTER — Encounter: Payer: Self-pay | Admitting: Family Medicine

## 2022-06-07 ENCOUNTER — Ambulatory Visit (INDEPENDENT_AMBULATORY_CARE_PROVIDER_SITE_OTHER): Payer: BC Managed Care – PPO | Admitting: Family Medicine

## 2022-06-07 VITALS — BP 170/111 | HR 87 | Temp 98.7°F | Resp 15 | Wt 186.3 lb

## 2022-06-07 DIAGNOSIS — E1159 Type 2 diabetes mellitus with other circulatory complications: Secondary | ICD-10-CM | POA: Diagnosis not present

## 2022-06-07 DIAGNOSIS — E785 Hyperlipidemia, unspecified: Secondary | ICD-10-CM | POA: Diagnosis not present

## 2022-06-07 DIAGNOSIS — I152 Hypertension secondary to endocrine disorders: Secondary | ICD-10-CM | POA: Diagnosis not present

## 2022-06-07 DIAGNOSIS — E1165 Type 2 diabetes mellitus with hyperglycemia: Secondary | ICD-10-CM

## 2022-06-07 DIAGNOSIS — E1169 Type 2 diabetes mellitus with other specified complication: Secondary | ICD-10-CM

## 2022-06-07 DIAGNOSIS — E559 Vitamin D deficiency, unspecified: Secondary | ICD-10-CM | POA: Diagnosis not present

## 2022-06-07 DIAGNOSIS — Z720 Tobacco use: Secondary | ICD-10-CM

## 2022-06-07 DIAGNOSIS — Z91199 Patient's noncompliance with other medical treatment and regimen due to unspecified reason: Secondary | ICD-10-CM | POA: Diagnosis not present

## 2022-06-07 DIAGNOSIS — Z1231 Encounter for screening mammogram for malignant neoplasm of breast: Secondary | ICD-10-CM

## 2022-06-07 DIAGNOSIS — F4329 Adjustment disorder with other symptoms: Secondary | ICD-10-CM

## 2022-06-07 DIAGNOSIS — I10 Essential (primary) hypertension: Secondary | ICD-10-CM

## 2022-06-07 DIAGNOSIS — F332 Major depressive disorder, recurrent severe without psychotic features: Secondary | ICD-10-CM

## 2022-06-07 DIAGNOSIS — R21 Rash and other nonspecific skin eruption: Secondary | ICD-10-CM

## 2022-06-07 MED ORDER — VALSARTAN-HYDROCHLOROTHIAZIDE 160-25 MG PO TABS: 1.0000 | ORAL_TABLET | Freq: Every day | ORAL | 1 refills | Status: DC

## 2022-06-07 MED ORDER — METOPROLOL SUCCINATE ER 50 MG PO TB24: 50.0000 mg | ORAL_TABLET | Freq: Every day | ORAL | 0 refills | Status: DC

## 2022-06-07 MED ORDER — CLOBETASOL PROPIONATE 0.05 % EX OINT: 1.0000 | TOPICAL_OINTMENT | Freq: Two times a day (BID) | CUTANEOUS | 0 refills | Status: AC

## 2022-06-07 MED ORDER — AMLODIPINE BESYLATE 10 MG PO TABS: 10.0000 mg | ORAL_TABLET | Freq: Every day | ORAL | 1 refills | Status: DC

## 2022-06-07 NOTE — Patient Instructions (Addendum)
Please take all medications each day; keep next appt in 4 weeks to ensure BP control.  The CDC recommends two doses of Shingrix (the new shingles vaccine) separated by 2 to 6 months for adults age 60 years and older. I recommend checking with your insurance plan regarding coverage for this vaccine.    Please call and schedule your mammogram:  [facility]  [place], [place],  [place]  [id number] Get Driving Directions Main: [phone]  Sunday:Closed Monday:7:20 AM - 5:00 PM Tuesday:7:20 AM - 5:00 PM Wednesday:7:20 AM - 5:00 PM Thursday:7:20 AM - 5:00 PM Friday:7:20 AM - 4:30 PM Saturday:Closed

## 2022-06-08 LAB — COMPREHENSIVE METABOLIC PANEL
ALT: 22 IU/L (ref 0–32)
AST: 13 IU/L (ref 0–40)
Albumin/Globulin Ratio: 1.4 (ref 1.2–2.2)
Albumin: 4.3 g/dL (ref 3.8–4.9)
Alkaline Phosphatase: 163 IU/L — ABNORMAL HIGH (ref 44–121)
BUN/Creatinine Ratio: 15 (ref 12–28)
BUN: 11 mg/dL (ref 8–27)
Bilirubin Total: 0.5 mg/dL (ref 0.0–1.2)
CO2: 26 mmol/L (ref 20–29)
Calcium: 10.1 mg/dL (ref 8.7–10.3)
Chloride: 101 mmol/L (ref 96–106)
Creatinine, Ser: 0.73 mg/dL (ref 0.57–1.00)
Globulin, Total: 3.1 g/dL (ref 1.5–4.5)
Glucose: 157 mg/dL — ABNORMAL HIGH (ref 70–99)
Potassium: 3.5 mmol/L (ref 3.5–5.2)
Sodium: 141 mmol/L (ref 134–144)
Total Protein: 7.4 g/dL (ref 6.0–8.5)
eGFR: 94 mL/min/{1.73_m2} (ref >59)

## 2022-06-08 LAB — VITAMIN D 25 HYDROXY (VIT D DEFICIENCY, FRACTURES): Vit D, 25-Hydroxy: 13 ng/mL — ABNORMAL LOW (ref 30.0–100.0)

## 2022-06-08 LAB — CBC WITH DIFFERENTIAL/PLATELET
Basophils Absolute: 0.1 10*3/uL (ref 0.0–0.2)
Basos: 1 %
EOS (ABSOLUTE): 0.4 10*3/uL (ref 0.0–0.4)
Eos: 4 %
Hematocrit: 44.4 % (ref 34.0–46.6)
Hemoglobin: 15 g/dL (ref 11.1–15.9)
Immature Grans (Abs): 0 10*3/uL (ref 0.0–0.1)
Immature Granulocytes: 0 %
Lymphocytes Absolute: 3.4 10*3/uL — ABNORMAL HIGH (ref 0.7–3.1)
Lymphs: 29 %
MCH: 30.3 pg (ref 26.6–33.0)
MCHC: 33.8 g/dL (ref 31.5–35.7)
MCV: 90 fL (ref 79–97)
Monocytes Absolute: 0.9 10*3/uL (ref 0.1–0.9)
Monocytes: 7 %
Neutrophils Absolute: 6.9 10*3/uL (ref 1.4–7.0)
Neutrophils: 59 %
Platelets: 369 10*3/uL (ref 150–450)
RBC: 4.95 x10E6/uL (ref 3.77–5.28)
RDW: 12.1 % (ref 11.7–15.4)
WBC: 11.6 10*3/uL — ABNORMAL HIGH (ref 3.4–10.8)

## 2022-06-08 LAB — LIPID PANEL
Chol/HDL Ratio: 3 ratio (ref 0.0–4.4)
Cholesterol, Total: 208 mg/dL — ABNORMAL HIGH (ref 100–199)
HDL: 69 mg/dL (ref >39)
LDL Chol Calc (NIH): 112 mg/dL — ABNORMAL HIGH (ref 0–99)
Triglycerides: 156 mg/dL — ABNORMAL HIGH (ref 0–149)
VLDL Cholesterol Cal: 27 mg/dL (ref 5–40)

## 2022-06-08 LAB — HEMOGLOBIN A1C
Est. average glucose Bld gHb Est-mCnc: 169 mg/dL
Hgb A1c MFr Bld: 7.5 % — ABNORMAL HIGH (ref 4.8–5.6)

## 2022-06-09 ENCOUNTER — Encounter: Payer: Self-pay | Admitting: Family Medicine

## 2022-06-09 DIAGNOSIS — Z91199 Patient's noncompliance with other medical treatment and regimen due to unspecified reason: Secondary | ICD-10-CM | POA: Insufficient documentation

## 2022-06-09 NOTE — Assessment & Plan Note (Signed)
Chronic, previously elevated LDL goal of 55-70 Recommend restart of lipitor 80 mg in combination with lifestyle change- diet, exercise, tobacco reduction

## 2022-06-09 NOTE — Assessment & Plan Note (Signed)
Chronic, variable Continues to be grieving following loss of her spouse; family is supportive; however, patient shows anhedonia over work and self care  Continues to decline medication to assist; plans to reach back out to her counselor

## 2022-06-09 NOTE — Assessment & Plan Note (Signed)
Chronic, elevated Continue to recommend reduction with goals of cessation; patient refers assistance at this time

## 2022-06-09 NOTE — Assessment & Plan Note (Signed)
Chronic, previously non adherent to medication plans previously -restart norvasc at 10 mg -restart metop at 50 mg -restart diovan hct 160-25 mg 1 month f/u recommended Goal <130/<80

## 2022-06-09 NOTE — Assessment & Plan Note (Signed)
Chronic, elevated Continue to recommend balanced, lower carb meals. Smaller meal size, adding snacks. Choosing water as drink of choice and increasing purposeful exercise. Continue to recommend use of metformin 1000 mg bid with meals Repeat A1c in 3 months Encourage eye exam and foot exam at next visit

## 2022-06-09 NOTE — Assessment & Plan Note (Signed)
Chronic, contributing to mood disorder Continue to recommend labs for follow up and need for additional supplementation if indicated

## 2022-06-09 NOTE — Progress Notes (Signed)
Blood chemistry shows -stable glucose elevated -returned alkaline phosphatase elevated  Cholesterol is increased; total is elevated as is fats and bad/LDL. Stroke risk/MI risk is current >50% in 10 years.  The 10-year ASCVD risk score (Arnett DK, et al., 2019) is: 50.7%   Values used to calculate the score:     Age: 62 years     Sex: Female     Is Non-Hispanic African American: Yes     Diabetic: Yes     Tobacco smoker: Yes     Systolic Blood Pressure: 123XX123 mmHg     Is BP treated: Yes     HDL Cholesterol: 69 mg/dL     Total Cholesterol: 208 mg/dL  Stable elevation in WBC and lymphocytes.  Low Vit D remains; recommend use of supplement 5000 IU daily OTC. Please pick up and take daily with meal.  A1c remains in diabetic range at 7.5%. Goal <7%. Continue to recommend balanced, lower carb meals. Smaller meal size, adding snacks. Choosing water as drink of choice and increasing purposeful exercise.

## 2022-06-10 NOTE — Telephone Encounter (Signed)
Requested Prescriptions  Refused Prescriptions Disp Refills   valsartan-hydrochlorothiazide (DIOVAN-HCT) 160-25 MG tablet [Pharmacy Med Name: VALSARTAN/HCTZ 160MG /25MG  TABLETS] 90 tablet     Sig: TAKE 1 TABLET BY MOUTH DAILY     Cardiovascular: ARB + Diuretic Combos Failed - 06/07/2022  1:52 PM      Failed - K in normal range and within 180 days    Potassium  Date Value Ref Range Status  06/07/2022 3.5 3.5 - 5.2 mmol/L Final         Failed - Na in normal range and within 180 days    Sodium  Date Value Ref Range Status  06/07/2022 141 134 - 144 mmol/L Final         Failed - Cr in normal range and within 180 days    Creatinine, Ser  Date Value Ref Range Status  06/07/2022 0.73 0.57 - 1.00 mg/dL Final         Failed - eGFR is 10 or above and within 180 days    GFR calc Af Amer  Date Value Ref Range Status  03/15/2020 99 >59 mL/min/1.73 Final    Comment:    **In accordance with recommendations from the NKF-ASN Task force,**   [facility] is in the process of updating its eGFR calculation to the   2021 CKD-EPI creatinine equation that estimates kidney function   without a race variable.    GFR calc non Af Amer  Date Value Ref Range Status  03/15/2020 86 >59 mL/min/1.73 Final   eGFR  Date Value Ref Range Status  06/07/2022 94 >59 mL/min/1.73 Final         Failed - Last BP in normal range    BP Readings from Last 1 Encounters:  06/07/22 (!) 170/111         Passed - Patient is not pregnant      Passed - Valid encounter within last 6 months    Recent Outpatient Visits           3 days ago Current non-adherence to medical treatment   [place] Gwyneth Sprout, FNP   6 months ago Encounter for annual health examination   [facility] Tally Joe T, FNP   10 months ago Hypertension associated with diabetes [facility])   [place] Tally Joe T, FNP   11 months ago Type 2 diabetes mellitus with  hyperglycemia, without long-term current use of insulin [facility])   [place] Tally Joe T, FNP   1 year ago Type 2 diabetes mellitus without complication, without long-term current use of insulin [facility])   [place] Tally Joe T, FNP       Future Appointments             In 4 weeks Simmons-Robinson, Riki Sheer, MD [facility], [facility]             metoprolol succinate (TOPROL-XL) 50 MG 24 hr tablet [Pharmacy Med Name: METOPROLOL ER SUCCINATE 50MG  TABS] 90 tablet     Sig: TAKE 1 TABLET(50 MG) BY MOUTH DAILY     Cardiovascular:  Beta Blockers Failed - 06/07/2022  1:52 PM      Failed - Last BP in normal range    BP Readings from Last 1 Encounters:  06/07/22 (!) 170/111         Passed - Last Heart Rate in normal range    Pulse Readings from Last  1 Encounters:  06/07/22 87         Passed - Valid encounter within last 6 months    Recent Outpatient Visits           3 days ago Current non-adherence to medical treatment   [facility] Gwyneth Sprout, FNP   6 months ago Encounter for annual health examination   [facility] Tally Joe T, FNP   10 months ago Hypertension associated with diabetes [facility])   [place] Tally Joe T, FNP   11 months ago Type 2 diabetes mellitus with hyperglycemia, without long-term current use of insulin [facility])   [place] Tally Joe T, FNP   1 year ago Type 2 diabetes mellitus without complication, without long-term current use of insulin [facility])   [place] Tally Joe T, FNP       Future Appointments             In 4 weeks Simmons-Robinson, Riki Sheer, MD [facility], [facility]             amLODipine ([place]) 10 MG tablet [Pharmacy Med Name: AMLODIPINE BESYLATE 10MG  TABLETS] 90 tablet     Sig: TAKE 1  TABLET(10 MG) BY MOUTH DAILY     Cardiovascular: Calcium Channel Blockers 2 Failed - 06/07/2022  1:52 PM      Failed - Last BP in normal range    BP Readings from Last 1 Encounters:  06/07/22 (!) 170/111         Passed - Last Heart Rate in normal range    Pulse Readings from Last 1 Encounters:  06/07/22 87         Passed - Valid encounter within last 6 months    Recent Outpatient Visits           3 days ago Current non-adherence to medical treatment   [facility] Gwyneth Sprout, FNP   6 months ago Encounter for annual health examination   [facility] Tally Joe T, FNP   10 months ago Hypertension associated with diabetes [facility])   [place] Tally Joe T, FNP   11 months ago Type 2 diabetes mellitus with hyperglycemia, without long-term current use of insulin [facility])   [place] Tally Joe T, FNP   1 year ago Type 2 diabetes mellitus without complication, without long-term current use of insulin [facility])   [place] Tally Joe T, FNP       Future Appointments             In 4 weeks Simmons-Robinson, Riki Sheer, MD [facility], [facility]

## 2022-07-08 NOTE — Progress Notes (Signed)
I,Madison Ritter,acting as a scribe for [employer], MD.,have documented all relevant documentation on the behalf of Madison Ramp, MD,as directed by  Madison Ramp, MD while in the presence of Madison Ramp, MD.   Established patient visit   Patient: Madison Ritter   DOB: Apr 21, 1960   62 y.o. Female  MRN: [id number] Visit Date: 07/09/2022  Today's healthcare provider: [name], MD   Chief Complaint  Patient presents with   follow-up HTN   Subjective    HPI  Hypertension, follow-up  BP Readings from Last 3 Encounters:  07/09/22 (!) 148/92  06/07/22 (!) 170/111  12/06/21 (!) 135/90   Wt Readings from Last 3 Encounters:  07/09/22 185 lb 1.6 oz (84 kg)  06/07/22 186 lb 4.8 oz (84.5 kg)  12/06/21 187 lb 8 oz (85 kg)     She was last seen for hypertension 1 months ago.  BP at that visit was 170/111. Management since that visit includes restart norvasc at 10 mg,restart metop at 50 mg,restart diovan hct 160-25 mg. Reports that out of the month she missed three days.  She reports excellent compliance with treatment. She is not having side effects.   Smokes: Yes, has been smoking tobacco for 10 years   Outside blood pressures are not being checked.  Symptoms: No chest pain No chest pressure  No palpitations No syncope  No dyspnea No orthopnea  No paroxysmal nocturnal dyspnea No lower extremity edema   Pertinent labs Lab Results  Component Value Date   CHOL 208 (H) 06/07/2022   HDL 69 06/07/2022   LDLCALC 112 (H) 06/07/2022   TRIG 156 (H) 06/07/2022   CHOLHDL 3.0 06/07/2022   Lab Results  Component Value Date   NA 141 06/07/2022   K 3.5 06/07/2022   CREATININE 0.73 06/07/2022   EGFR 94 06/07/2022   GLUCOSE 157 (H) 06/07/2022   TSH 0.536 12/06/2021     The 10-year ASCVD risk score (Arnett DK, et al., 2019) is: 37.5%  Reports that she has en eye appointment next month with  [facility]. ---------------------------------------------------------------------------------------------------   Diabetes  patient states she does not want to continue taking metformin because she has heard bad things, she states that she is afraid of the medication and SE, she reports that she has noticed that, she states that the last time she had her A1C checked after being in birthday mode eating and drinking a lot prior to her vacation. She states she does not check blood glucose at home. She reports that she often forgets to eat and will feel lightheaded. She reports usually takes 2 tablets of metformin once daily because she does not eat at night   Medications: Outpatient Medications Prior to Visit  Medication Sig   acetaminophen (TYLENOL) 500 MG tablet Take 1,000 mg by mouth every 6 (six) hours as needed (for pain.).   amLODipine (NORVASC) 10 MG tablet Take 1 tablet (10 mg total) by mouth daily.   atorvastatin (LIPITOR) 80 MG tablet Take 1 tablet (80 mg total) by mouth daily.   clobetasol ointment (TEMOVATE) 0.05 % Apply 1 Application topically 2 (two) times daily.   metFORMIN (GLUCOPHAGE) 500 MG tablet Take 2 tablets (1,000 mg total) by mouth 2 (two) times daily with a meal.   metoprolol succinate (TOPROL-XL) 50 MG 24 hr tablet Take 1 tablet (50 mg total) by mouth daily.   valsartan-hydrochlorothiazide (DIOVAN-HCT) 160-25 MG tablet Take 1 tablet by mouth daily.   Vitamin D, Ergocalciferol, (DRISDOL) 1.25 MG (  50000 UNIT) CAPS capsule Take 1 capsule (50,000 Units total) by mouth every 7 (seven) days.   No facility-administered medications prior to visit.    Review of Systems     Objective    BP (!) 148/92 (BP Location: Left Arm, Patient Position: Sitting, Cuff Size: Large)   Pulse 68   Temp 98.7 F (37.1 C) (Oral)   Resp 16   Ht 5\' 4"  (1.626 m)   Wt 185 lb 1.6 oz (84 kg)   BMI 31.77 kg/m    Physical Exam Vitals reviewed.  Constitutional:      General: She is not in acute  distress.    Appearance: Normal appearance. She is not ill-appearing, toxic-appearing or diaphoretic.  Eyes:     Conjunctiva/sclera: Conjunctivae normal.  Cardiovascular:     Rate and Rhythm: Normal rate and regular rhythm.     Pulses: Normal pulses.     Heart sounds: Normal heart sounds. No murmur heard.    No friction rub. No gallop.  Pulmonary:     Effort: Pulmonary effort is normal. No respiratory distress.     Breath sounds: Normal breath sounds. No stridor. No wheezing, rhonchi or rales.  Abdominal:     General: Bowel sounds are normal. There is no distension.     Palpations: Abdomen is soft.     Tenderness: There is no abdominal tenderness.  Musculoskeletal:     Right lower leg: No edema.     Left lower leg: No edema.  Skin:    Findings: No erythema or rash.  Neurological:     Mental Status: She is alert and oriented to person, place, and time.       No results found for any visits on 07/09/22.  Assessment & Plan     Problem List Items Addressed This Visit       Cardiovascular and Mediastinum   Hypertension associated with diabetes - Primary    Chronic  Bp elevated in office, not at goal  Patient will monitor BP over the next 2 weeks and follow up with ambulatory measurements one hour after taking medications  Patient given chart to record BP and HR  Counseled patient on importance of smoking cessation to optimize overall health especially cardiovascular health and BP control, patient voiced understanding  She will continue current medications         Endocrine   Type 2 diabetes mellitus without complication, without long-term current use of insulin    Chronic  Patient advised to continue metformin 1000mg  twice daily  Counseled to take twice daily with a snack at dinner time if she does not have a full meal  We discussed other oral agents and potential side effects such a SGLT2i,ultimately advised patient to continue current regimen and plan to recheck A1c in  June 2024          Return in about 2 weeks (around 07/23/2022) for HTN .        The entirety of the information documented in the History of Present Illness, Review of Systems and Physical Exam were personally obtained by me. Portions of this information were initially documented by Hetty Ely, CMA . I, Madison Ramp, MD have reviewed the documentation above for thoroughness and accuracy.   Madison Ramp, MD  [facility] [phone] (phone) [phone] (fax)  [facility]

## 2022-07-09 ENCOUNTER — Encounter: Payer: Self-pay | Admitting: Family Medicine

## 2022-07-09 ENCOUNTER — Ambulatory Visit (INDEPENDENT_AMBULATORY_CARE_PROVIDER_SITE_OTHER): Payer: BC Managed Care – PPO | Admitting: Family Medicine

## 2022-07-09 VITALS — BP 148/92 | HR 68 | Temp 98.7°F | Resp 16 | Ht 64.0 in | Wt 185.1 lb

## 2022-07-09 DIAGNOSIS — E1159 Type 2 diabetes mellitus with other circulatory complications: Secondary | ICD-10-CM | POA: Diagnosis not present

## 2022-07-09 DIAGNOSIS — E119 Type 2 diabetes mellitus without complications: Secondary | ICD-10-CM | POA: Diagnosis not present

## 2022-07-09 DIAGNOSIS — I152 Hypertension secondary to endocrine disorders: Secondary | ICD-10-CM | POA: Diagnosis not present

## 2022-07-09 NOTE — Patient Instructions (Addendum)
Your blood pressure is elevated today.   Please obtain a blood pressure cuff and measure your blood pressure 1 hour after taking your medications and record in the table below   DATE Systolic  Diastolic   Heart rate   Example 07/09/22 148 92 68                                                                         Please continue your metformin  twice daily. Take the night dose with a snack if you do not eat dinner. We will recheck your A1c in June

## 2022-07-09 NOTE — Assessment & Plan Note (Signed)
Chronic  Bp elevated in office, not at goal  Patient will monitor BP over the next 2 weeks and follow up with ambulatory measurements one hour after taking medications  Patient given chart to record BP and HR  Counseled patient on importance of smoking cessation to optimize overall health especially cardiovascular health and BP control, patient voiced understanding  She will continue current medications

## 2022-07-09 NOTE — Assessment & Plan Note (Signed)
Chronic  Patient advised to continue metformin  twice daily  Counseled to take twice daily with a snack at dinner time if she does not have a full meal  We discussed other oral agents and potential side effects such a SGLT2i,ultimately advised patient to continue current regimen and plan to recheck A1c in June 2024

## 2022-07-15 ENCOUNTER — Other Ambulatory Visit: Payer: Self-pay | Admitting: Family Medicine

## 2022-07-15 DIAGNOSIS — I10 Essential (primary) hypertension: Secondary | ICD-10-CM

## 2022-07-24 ENCOUNTER — Ambulatory Visit (INDEPENDENT_AMBULATORY_CARE_PROVIDER_SITE_OTHER): Payer: BC Managed Care – PPO | Admitting: Family Medicine

## 2022-07-24 ENCOUNTER — Other Ambulatory Visit: Payer: Self-pay | Admitting: Family Medicine

## 2022-07-24 ENCOUNTER — Encounter: Payer: Self-pay | Admitting: Family Medicine

## 2022-07-24 VITALS — BP 159/79 | HR 77 | Temp 98.7°F | Resp 16 | Wt 185.6 lb

## 2022-07-24 DIAGNOSIS — E1159 Type 2 diabetes mellitus with other circulatory complications: Secondary | ICD-10-CM | POA: Diagnosis not present

## 2022-07-24 DIAGNOSIS — Z794 Long term (current) use of insulin: Secondary | ICD-10-CM | POA: Diagnosis not present

## 2022-07-24 DIAGNOSIS — I10 Essential (primary) hypertension: Secondary | ICD-10-CM

## 2022-07-24 DIAGNOSIS — I152 Hypertension secondary to endocrine disorders: Secondary | ICD-10-CM

## 2022-07-24 MED ORDER — VALSARTAN-HYDROCHLOROTHIAZIDE 320-25 MG PO TABS: 1.0000 | ORAL_TABLET | Freq: Every day | ORAL | 3 refills | Status: DC

## 2022-07-24 MED ORDER — METOPROLOL SUCCINATE ER 50 MG PO TB24: 50.0000 mg | ORAL_TABLET | Freq: Every day | ORAL | 1 refills | Status: DC

## 2022-07-24 MED ORDER — HYDRALAZINE HCL 10 MG PO TABS: 10.0000 mg | ORAL_TABLET | Freq: Three times a day (TID) | ORAL | 0 refills | Status: DC

## 2022-07-24 NOTE — Telephone Encounter (Signed)
Requested Prescriptions  Pending Prescriptions Disp Refills   hydrALAZINE (APRESOLINE) 10 MG tablet [Pharmacy Med Name: HYDRALAZINE 10 MG TABLETS (ORANGE)] 270 tablet     Sig: TAKE 1 TABLET(10 MG) BY MOUTH THREE TIMES DAILY     Cardiovascular:  Vasodilators Failed - 07/24/2022  5:16 PM      Failed - WBC in normal range and within 360 days    WBC  Date Value Ref Range Status  06/07/2022 11.6 (H) 3.4 - 10.8 x10E3/uL Final         Failed - ANA Screen, Ifa, Serum in normal range and within 360 days    No results found for: "ANA", "ANATITER", "LABANTI"       Failed - Last BP in normal range    BP Readings from Last 1 Encounters:  07/24/22 (!) 159/79         Passed - HCT in normal range and within 360 days    Hematocrit  Date Value Ref Range Status  06/07/2022 44.4 34.0 - 46.6 % Final         Passed - HGB in normal range and within 360 days    Hemoglobin  Date Value Ref Range Status  06/07/2022 15.0 11.1 - 15.9 g/dL Final         Passed - RBC in normal range and within 360 days    RBC  Date Value Ref Range Status  06/07/2022 4.95 3.77 - 5.28 x10E6/uL Final         Passed - PLT in normal range and within 360 days    Platelets  Date Value Ref Range Status  06/07/2022 369 150 - 450 x10E3/uL Final         Passed - Valid encounter within last 12 months    Recent Outpatient Visits           Today Hypertension associated with diabetes (HCC)   [facility] Simmons-Robinson, [place], MD   2 weeks ago Hypertension associated with diabetes (HCC)   [place] Simmons-Robinson, [place], MD   1 month ago Current non-adherence to medical treatment   [facility] Jacky Kindle, FNP   7 months ago Encounter for annual health examination   [facility] Merita Norton T, FNP   11 months ago Hypertension associated with diabetes [facility])    [facility] Merita Norton T, FNP       Future Appointments             In 2 weeks Simmons-Robinson, Tawanna Cooler, MD [facility]

## 2022-07-24 NOTE — Assessment & Plan Note (Signed)
Chronic  Not at goal BP elevated  Continue amlodipine 10mg , continue metoprolol 50mg  daily Increased valsartan-hctz to 320-25mg  daily  Patient will continue to monitor BP 1 hour after meds  Will have her add hydralazine 10mg  if BP still elevated >140/90 after one week on increased dose of valsartan-hctz

## 2022-07-24 NOTE — Progress Notes (Signed)
I,[name],acting as a scribe for [employer], MD.,have documented all relevant documentation on the behalf of [name], MD,as directed by  [name], MD while in the presence of [name], MD.   Established patient visit   Patient: Madison Ritter   DOB: [birth year]   62 y.o. Female  MRN: [id number] Visit Date: 07/24/2022  Today's healthcare provider: [name], MD   Chief Complaint  Patient presents with   2 Week Follow-up   Subjective    HPI   HTN f/u  Patient presents for blood pressure follow up  BP at last visit was elevated at 148/92 She was recommended to measure BP for ambulatory recordings, and continue amlodipine 10mg , metoprolol 50mg  and valsartan-hct 160-25mg   Today she states her blood pressures readings at home have been in the 132-165/81-82. Reports that she ran out of the Metoprolol four days aog. Needs a refill.  She denies chest pain, HA,  She has been having vision changes/ blurry vision -reports that she has an eye appointment schedule. Patient bring BP machine in to compare it to the one in office it read 154/103 P 78 second reading 148/102  Wt Readings from Last 3 Encounters:  07/24/22 185 lb 9.6 oz (84.2 kg)  07/09/22 185 lb 1.6 oz (84 kg)  06/07/22 186 lb 4.8 oz (84.5 kg)     Medications: Outpatient Medications Prior to Visit  Medication Sig   acetaminophen (TYLENOL) 500 MG tablet Take 1,000 mg by mouth every 6 (six) hours as needed (for pain.).   amLODipine (NORVASC) 10 MG tablet Take 1 tablet (10 mg total) by mouth daily.   atorvastatin (LIPITOR) 80 MG tablet Take 1 tablet (80 mg total) by mouth daily.   clobetasol ointment (TEMOVATE) 0.05 % Apply 1 Application topically 2 (two) times daily.   metFORMIN (GLUCOPHAGE) 500 MG tablet Take 2 tablets (1,000 mg total) by mouth 2 (two) times daily with a meal.   Vitamin D, Ergocalciferol, (DRISDOL) 1.25 MG (50000 UNIT) CAPS  capsule Take 1 capsule (50,000 Units total) by mouth every 7 (seven) days.   [DISCONTINUED] metoprolol succinate (TOPROL-XL) 50 MG 24 hr tablet Take 1 tablet (50 mg total) by mouth daily.   [DISCONTINUED] valsartan-hydrochlorothiazide (DIOVAN-HCT) 160-25 MG tablet Take 1 tablet by mouth daily.   No facility-administered medications prior to visit.    Review of Systems     Objective    BP (!) 159/79 (BP Location: Left Arm, Patient Position: Sitting, Cuff Size: Normal) Comment: patient's BP machine  Pulse 77   Temp 98.7 F (37.1 C) (Oral)   Resp 16   Wt 185 lb 9.6 oz (84.2 kg)   BMI 31.86 kg/m    Physical Exam Vitals reviewed.  Constitutional:      General: She is not in acute distress.    Appearance: Normal appearance. She is not ill-appearing, toxic-appearing or diaphoretic.  Eyes:     Conjunctiva/sclera: Conjunctivae normal.  Cardiovascular:     Rate and Rhythm: Normal rate and regular rhythm.     Pulses: Normal pulses.     Heart sounds: Normal heart sounds. No murmur heard.    No friction rub. No gallop.  Pulmonary:     Effort: Pulmonary effort is normal. No respiratory distress.     Breath sounds: Normal breath sounds. No stridor. No wheezing, rhonchi or rales.  Abdominal:     General: Bowel sounds are normal. There is no distension.     Palpations: Abdomen is soft.  Tenderness: There is no abdominal tenderness.  Musculoskeletal:     Right lower leg: No edema.     Left lower leg: No edema.  Skin:    Findings: No erythema or rash.  Neurological:     Mental Status: She is alert and oriented to person, place, and time.      No results found for any visits on 07/24/22.  Assessment & Plan     Problem List Items Addressed This Visit       Cardiovascular and Mediastinum   Hypertension associated with diabetes (HCC) - Primary    Chronic  Not at goal BP elevated  Continue amlodipine 10mg , continue metoprolol 50mg  daily Increased valsartan-hctz to 320-25mg   daily  Patient will continue to monitor BP 1 hour after meds  Will have her add hydralazine 10mg  if BP still elevated >140/90 after one week on increased dose of valsartan-hctz      Relevant Medications   metoprolol succinate (TOPROL-XL) 50 MG 24 hr tablet   valsartan-hydrochlorothiazide (DIOVAN-HCT) 320-25 MG tablet   hydrALAZINE (APRESOLINE) 10 MG tablet   Other Visit Diagnoses        Return in about 2 weeks (around 08/07/2022) for HTN.        The entirety of the information documented in the History of Present Illness, Review of Systems and Physical Exam were personally obtained by me. Portions of this information were initially documented by Hetty Ely, CMA . I, [name], MD have reviewed the documentation above for thoroughness and accuracy.      [name], MD  [facility] [phone] (phone) [phone] (fax)  [facility]

## 2022-07-24 NOTE — Patient Instructions (Signed)
Please start the new dose of the Valsartan-HCTZ and continue to measure your pressure 1-2 hours after taking your medications.   If your blood pressure is still elevated after one week of continuing your meds with the new dose, then start the hydralazine (which is taken every 8 hours, it's ok to start every 12 hours before increasing to 3 times daily)    Please see me back in 2 weeks for blood pressure    [employer] to help find a therapist

## 2022-07-30 ENCOUNTER — Ambulatory Visit: Payer: BC Managed Care – PPO | Admitting: Family Medicine

## 2022-07-31 DIAGNOSIS — H2513 Age-related nuclear cataract, bilateral: Secondary | ICD-10-CM | POA: Diagnosis not present

## 2022-07-31 DIAGNOSIS — H04123 Dry eye syndrome of bilateral lacrimal glands: Secondary | ICD-10-CM | POA: Diagnosis not present

## 2022-07-31 DIAGNOSIS — H524 Presbyopia: Secondary | ICD-10-CM | POA: Diagnosis not present

## 2022-07-31 DIAGNOSIS — E119 Type 2 diabetes mellitus without complications: Secondary | ICD-10-CM | POA: Diagnosis not present

## 2022-07-31 DIAGNOSIS — H5213 Myopia, bilateral: Secondary | ICD-10-CM | POA: Diagnosis not present

## 2022-07-31 LAB — HM DIABETES EYE EXAM

## 2022-08-06 ENCOUNTER — Encounter: Payer: Self-pay | Admitting: Family Medicine

## 2022-08-07 ENCOUNTER — Encounter: Payer: Self-pay | Admitting: Family Medicine

## 2022-08-07 ENCOUNTER — Ambulatory Visit (INDEPENDENT_AMBULATORY_CARE_PROVIDER_SITE_OTHER): Payer: BC Managed Care – PPO | Admitting: Family Medicine

## 2022-08-07 VITALS — BP 164/91 | HR 69 | Temp 98.8°F | Resp 16 | Wt 186.8 lb

## 2022-08-07 DIAGNOSIS — I152 Hypertension secondary to endocrine disorders: Secondary | ICD-10-CM | POA: Diagnosis not present

## 2022-08-07 DIAGNOSIS — R1011 Right upper quadrant pain: Secondary | ICD-10-CM | POA: Diagnosis not present

## 2022-08-07 DIAGNOSIS — E1159 Type 2 diabetes mellitus with other circulatory complications: Secondary | ICD-10-CM | POA: Diagnosis not present

## 2022-08-07 MED ORDER — VALSARTAN-HYDROCHLOROTHIAZIDE 160-25 MG PO TABS
1.0000 | ORAL_TABLET | Freq: Every day | ORAL | 3 refills | Status: AC
Start: 2022-08-07 — End: ?

## 2022-08-07 NOTE — Assessment & Plan Note (Signed)
Chronic  Uncontrolled, not at goal  Will return to 160-25mg  dose for valsartan-hctz combo daily  Will continue metoprolol 50mg  daily and continue amlodipine 10mg  daily

## 2022-08-07 NOTE — Patient Instructions (Addendum)
Your blood pressure is elevated and not within the goal range today.   Please return to using valsartan-hydrochlorothiazide 160-25 mg along with the hydralazine 10 mg twice daily, metoprolol 50 mg daily and amlodipine 10 mg for your blood pressure.   Please measure blood pressure 1-2 hours after medications   For your constipation please take 1 capful of miralax twice daily. If you are still unable to have a bowel movement once daily, add senna one tab once daily (increase to twice daily if no improvement)    I have ordered an ultrasound of your abdomen. Someone will call to schedule your appointment for this imaging

## 2022-08-07 NOTE — Assessment & Plan Note (Addendum)
Acute problem  VS stable (except for elevated BP) Patient is stable on exam with some RUQ tenderness to light palpation  Will obtain hepatic function panel, CBC, lipase and acute hepatitis panel  RUQ [place] ordered for palpable soft nodule on exam

## 2022-08-07 NOTE — Progress Notes (Signed)
I,[name],acting as a scribe for [employer], MD.,have documented all relevant documentation on the behalf of [name], MD,as directed by  [name], MD while in the presence of [name], MD.   Established patient visit   Patient: Madison Ritter   DOB: 1960-08-15   62 y.o. Female  MRN: [id number] Visit Date: 08/07/2022  Today's healthcare provider: [name], MD   Chief Complaint  Patient presents with   Follow-up   Subjective    HPI   Hypertension, follow-up  BP Readings from Last 3 Encounters:  08/07/22 (!) 164/91  07/24/22 (!) 159/79  07/09/22 (!) 148/92   Wt Readings from Last 3 Encounters:  08/07/22 186 lb 12.8 oz (84.7 kg)  07/24/22 185 lb 9.6 oz (84.2 kg)  07/09/22 185 lb 1.6 oz (84 kg)     She was last seen for hypertension 2 weeks ago.  BP at that visit was 159/79. Management since that visit includes Continue amlodipine 10mg , continue metoprolol 50mg  daily Increased valsartan-hctz to 320-25mg  daily  Patient will continue to monitor BP 1 hour after meds  Will have her add hydralazine 10mg  if BP still elevated >140/90 after one week on increased dose of valsartan-hctz.  She reports excellent compliance with treatment. Reports that Valsartan-hctz caused some constipation, lightheaded and dizzy. Patient reports that she went back to 160-25 mg.  Current symptom: reports still having problem having a bowel movement.  Feels "foggy" and bloated. Patient reports lump/knot on right side (flank). Reports feeling nauseate and states she has not been eating well.   Outside blood pressures are 120's/65-150/82 with medicine and without medicine 122/75-152/81.  Pertinent labs Lab Results  Component Value Date   CHOL 208 (H) 06/07/2022   HDL 69 06/07/2022   LDLCALC 112 (H) 06/07/2022   TRIG 156 (H) 06/07/2022   CHOLHDL 3.0 06/07/2022   Lab Results  Component Value Date   NA 141  06/07/2022   K 3.5 06/07/2022   CREATININE 0.73 06/07/2022   EGFR 94 06/07/2022   GLUCOSE 157 (H) 06/07/2022   TSH 0.536 12/06/2021     The 10-year ASCVD risk score (Arnett DK, et al., 2019) is: 47.1%  ---------------------------------------------------------------------------------------------------   Medications: Outpatient Medications Prior to Visit  Medication Sig   acetaminophen (TYLENOL) 500 MG tablet Take 1,000 mg by mouth every 6 (six) hours as needed (for pain.).   amLODipine (NORVASC) 10 MG tablet Take 1 tablet (10 mg total) by mouth daily.   atorvastatin (LIPITOR) 80 MG tablet Take 1 tablet (80 mg total) by mouth daily.   clobetasol ointment (TEMOVATE) 0.05 % Apply 1 Application topically 2 (two) times daily.   hydrALAZINE (APRESOLINE) 10 MG tablet Take 1 tablet (10 mg total) by mouth 3 (three) times daily.   metFORMIN (GLUCOPHAGE) 500 MG tablet Take 2 tablets (1,000 mg total) by mouth 2 (two) times daily with a meal.   metoprolol succinate (TOPROL-XL) 50 MG 24 hr tablet Take 1 tablet (50 mg total) by mouth daily.   Vitamin D, Ergocalciferol, (DRISDOL) 1.25 MG (50000 UNIT) CAPS capsule Take 1 capsule (50,000 Units total) by mouth every 7 (seven) days.   [DISCONTINUED] valsartan-hydrochlorothiazide (DIOVAN-HCT) 320-25 MG tablet Take 1 tablet by mouth daily. (Patient not taking: Reported on 08/07/2022)   No facility-administered medications prior to visit.    Review of Systems     Objective    BP (!) 164/91 (BP Location: Left Arm, Patient Position: Sitting, Cuff Size: Large)   Pulse 69  Temp 98.8 F (37.1 C) (Oral)   Resp 16   Wt 186 lb 12.8 oz (84.7 kg)   BMI 32.06 kg/m    Physical Exam Vitals reviewed.  Constitutional:      General: She is not in acute distress.    Appearance: Normal appearance. She is not ill-appearing, toxic-appearing or diaphoretic.  Eyes:     Conjunctiva/sclera: Conjunctivae normal.  Cardiovascular:     Rate and Rhythm: Normal rate  and regular rhythm.     Pulses: Normal pulses.     Heart sounds: Normal heart sounds. No murmur heard.    No friction rub. No gallop.  Pulmonary:     Effort: Pulmonary effort is normal. No respiratory distress.     Breath sounds: Normal breath sounds. No stridor. No wheezing, rhonchi or rales.  Abdominal:     General: Bowel sounds are normal. There is no distension.     Palpations: Abdomen is soft.     Tenderness: There is no abdominal tenderness. There is no right CVA tenderness or left CVA tenderness.       Comments: Soft palpable nodule just inferior to ribs in RUQ of abdomen   Musculoskeletal:     Right lower leg: No edema.     Left lower leg: No edema.  Skin:    Findings: No erythema or rash.  Neurological:     Mental Status: She is alert and oriented to person, place, and time.       No results found for any visits on 08/07/22.  Assessment & Plan     Problem List Items Addressed This Visit       Cardiovascular and Mediastinum   Hypertension associated with diabetes (HCC)    Chronic  Uncontrolled, not at goal  Will return to 160-25mg  dose for valsartan-hctz combo daily  Will continue metoprolol 50mg  daily and continue amlodipine 10mg  daily        Relevant Medications   valsartan-hydrochlorothiazide (DIOVAN-HCT) 160-25 MG tablet     Other   RUQ abdominal pain - Primary    Acute problem  VS stable (except for elevated BP) Patient is stable on exam with some RUQ tenderness to light palpation  Will obtain hepatic function panel, CBC, lipase and acute hepatitis panel  RUQ [place] ordered for palpable soft nodule on exam        Relevant Orders   Hepatic function panel   Hepatitis, Acute   CBC   US Abdomen Limited RUQ (LIVER/GB)   Lipase     Return in about 1 month (around 09/07/2022) for HTN,RUQ pain .        The entirety of the information documented in the History of Present Illness, Review of Systems and Physical Exam were personally obtained by me.  Portions of this information were initially documented by Hetty Ely, CMA. I, [name], MD have reviewed the documentation above for thoroughness and accuracy.    [name], MD  [facility] [phone] (phone) [phone] (fax)  [facility]

## 2022-08-08 LAB — CBC
Hematocrit: 44.4 % (ref 34.0–46.6)
Hemoglobin: 15.4 g/dL (ref 11.1–15.9)
MCH: 31.7 pg (ref 26.6–33.0)
MCHC: 34.7 g/dL (ref 31.5–35.7)
MCV: 91 fL (ref 79–97)
Platelets: 389 10*3/uL (ref 150–450)
RBC: 4.86 x10E6/uL (ref 3.77–5.28)
RDW: 12.6 % (ref 11.7–15.4)
WBC: 14.5 10*3/uL — ABNORMAL HIGH (ref 3.4–10.8)

## 2022-08-08 LAB — ACUTE VIRAL HEPATITIS (HAV, HBV, HCV)
HCV Ab: NONREACTIVE
Hep A IgM: NEGATIVE
Hep B C IgM: NEGATIVE
Hepatitis B Surface Ag: NEGATIVE

## 2022-08-08 LAB — HEPATIC FUNCTION PANEL
ALT: 28 IU/L (ref 0–32)
AST: 16 IU/L (ref 0–40)
Albumin: 4.4 g/dL (ref 3.8–4.9)
Alkaline Phosphatase: 177 IU/L — ABNORMAL HIGH (ref 44–121)
Bilirubin Total: 0.4 mg/dL (ref 0.0–1.2)
Bilirubin, Direct: 0.12 mg/dL (ref 0.00–0.40)
Total Protein: 7.2 g/dL (ref 6.0–8.5)

## 2022-08-08 LAB — HCV INTERPRETATION

## 2022-08-08 LAB — LIPASE: Lipase: 90 U/L — ABNORMAL HIGH (ref 14–72)

## 2022-08-15 ENCOUNTER — Ambulatory Visit
Admission: RE | Admit: 2022-08-15 | Discharge: 2022-08-15 | Disposition: A | Discharge status: Home / Self Care | Payer: BC Managed Care – PPO | Source: Ambulatory Visit | Attending: Family Medicine | Admitting: Family Medicine

## 2022-08-15 ENCOUNTER — Other Ambulatory Visit: Payer: Self-pay | Admitting: Family Medicine

## 2022-08-15 DIAGNOSIS — Z1231 Encounter for screening mammogram for malignant neoplasm of breast: Secondary | ICD-10-CM | POA: Insufficient documentation

## 2022-08-15 DIAGNOSIS — M7989 Other specified soft tissue disorders: Secondary | ICD-10-CM

## 2022-08-16 ENCOUNTER — Ambulatory Visit
Admission: RE | Admit: 2022-08-16 | Discharge: 2022-08-16 | Disposition: A | Discharge status: Home / Self Care | Payer: BC Managed Care – PPO | Source: Ambulatory Visit | Attending: Family Medicine | Admitting: Family Medicine

## 2022-08-16 DIAGNOSIS — M7989 Other specified soft tissue disorders: Secondary | ICD-10-CM | POA: Diagnosis not present

## 2022-08-16 DIAGNOSIS — R1011 Right upper quadrant pain: Secondary | ICD-10-CM | POA: Diagnosis not present

## 2022-08-16 DIAGNOSIS — K760 Fatty (change of) liver, not elsewhere classified: Secondary | ICD-10-CM | POA: Diagnosis not present

## 2022-08-16 DIAGNOSIS — R1900 Intra-abdominal and pelvic swelling, mass and lump, unspecified site: Secondary | ICD-10-CM | POA: Diagnosis not present

## 2022-08-16 NOTE — Progress Notes (Signed)
Ok to wait until PCP back in office on Monday to determine next steps

## 2022-08-16 NOTE — Progress Notes (Signed)
Hi Madison Ritter  Normal mammogram; repeat in 1 year.  Please let us know if you have any questions.  Thank you,  Merita Norton, FNP

## 2022-09-10 ENCOUNTER — Encounter: Payer: Self-pay | Admitting: Family Medicine

## 2022-09-10 ENCOUNTER — Ambulatory Visit (INDEPENDENT_AMBULATORY_CARE_PROVIDER_SITE_OTHER): Payer: BC Managed Care – PPO | Admitting: Family Medicine

## 2022-09-10 VITALS — BP 136/86 | HR 71 | Wt 184.6 lb

## 2022-09-10 DIAGNOSIS — I152 Hypertension secondary to endocrine disorders: Secondary | ICD-10-CM

## 2022-09-10 DIAGNOSIS — R112 Nausea with vomiting, unspecified: Secondary | ICD-10-CM

## 2022-09-10 DIAGNOSIS — E119 Type 2 diabetes mellitus without complications: Secondary | ICD-10-CM | POA: Diagnosis not present

## 2022-09-10 DIAGNOSIS — E1159 Type 2 diabetes mellitus with other circulatory complications: Secondary | ICD-10-CM

## 2022-09-10 LAB — POCT GLYCOSYLATED HEMOGLOBIN (HGB A1C): Hemoglobin A1C: 7.5 % — AB (ref 4.0–5.6)

## 2022-09-10 MED ORDER — HYDRALAZINE HCL 10 MG PO TABS
10.0000 mg | ORAL_TABLET | Freq: Two times a day (BID) | ORAL | 0 refills | Status: DC
Start: 2022-09-10 — End: 2022-11-14

## 2022-09-10 MED ORDER — ONDANSETRON 4 MG PO TBDP
4.0000 mg | ORAL_TABLET | Freq: Three times a day (TID) | ORAL | 0 refills | Status: DC | PRN
Start: 2022-09-10 — End: 2023-01-20

## 2022-09-10 NOTE — Progress Notes (Unsigned)
I,Sha'taria Tyson,acting as a Neurosurgeon for [employer], MD.,have documented all relevant documentation on the behalf of [name], MD,as directed by  [name], MD while in the presence of [name], MD.   Established patient visit   Patient: Madison Ritter   DOB: July 15, 1960   62 y.o. Female  MRN: [id number] Visit Date: 09/10/2022  Today's healthcare provider: [name], MD   No chief complaint on file.  Subjective    HPI  Hypertension, follow-up  BP Readings from Last 3 Encounters:  09/10/22 136/86  08/07/22 (!) 164/91  07/24/22 (!) 159/79   Wt Readings from Last 3 Encounters:  09/10/22 184 lb 9.6 oz (83.7 kg)  08/07/22 186 lb 12.8 oz (84.7 kg)  07/24/22 185 lb 9.6 oz (84.2 kg)     She was last seen for hypertension 4 weeks ago.  BP at that visit was 164/91. Management since that visit includes return to 160-25mg  dose for valsartan-hctz combo daily.  Will continue metoprolol 50mg  daily and continue amlodipine 10mg  daily .  She reports excellent compliance with treatment. She is having side effects. Constipation, Abdominal distention, vomiting She is following a Low Sodium diet. She is not exercising. She does smoke. Outside blood pressures are home values available.  Symptoms: No chest pain No chest pressure  No palpitations Yes syncope  No dyspnea No orthopnea  No paroxysmal nocturnal dyspnea No lower extremity edema   Pertinent labs Lab Results  Component Value Date   CHOL 208 (H) 06/07/2022   HDL 69 06/07/2022   LDLCALC 112 (H) 06/07/2022   TRIG 156 (H) 06/07/2022   CHOLHDL 3.0 06/07/2022   Lab Results  Component Value Date   NA 141 06/07/2022   K 3.5 06/07/2022   CREATININE 0.73 06/07/2022   EGFR 94 06/07/2022   GLUCOSE 157 (H) 06/07/2022   TSH 0.536 12/06/2021     The 10-year ASCVD risk score (Arnett DK, et al., 2019) is:  30.6% ---------------------------------------------------------------------------------------------------  Follow up for Right Upper Quadrant Pain  The patient was last seen for this 3 weeks ago. Changes made at last visit include obtain hepatic function panel, CBC, lipase and acute hepatitis panel. RUQ [place] ordered for palpable soft nodule on exam.  -----------------------------------------------------------------------------------------  Medications: Outpatient Medications Prior to Visit  Medication Sig  . acetaminophen (TYLENOL) 500 MG tablet Take 1,000 mg by mouth every 6 (six) hours as needed (for pain.).  Marland Kitchen amLODipine (NORVASC) 10 MG tablet Take 1 tablet (10 mg total) by mouth daily.  Marland Kitchen atorvastatin (LIPITOR) 80 MG tablet Take 1 tablet (80 mg total) by mouth daily.  . clobetasol ointment (TEMOVATE) 0.05 % Apply 1 Application topically 2 (two) times daily.  . metFORMIN (GLUCOPHAGE) 500 MG tablet Take 2 tablets (1,000 mg total) by mouth 2 (two) times daily with a meal.  . metoprolol succinate (TOPROL-XL) 50 MG 24 hr tablet Take 1 tablet (50 mg total) by mouth daily.  . valsartan-hydrochlorothiazide (DIOVAN-HCT) 160-25 MG tablet Take 1 tablet by mouth daily.  . Vitamin D, Ergocalciferol, (DRISDOL) 1.25 MG (50000 UNIT) CAPS capsule Take 1 capsule (50,000 Units total) by mouth every 7 (seven) days.  . [DISCONTINUED] hydrALAZINE (APRESOLINE) 10 MG tablet Take 1 tablet (10 mg total) by mouth 3 (three) times daily.   No facility-administered medications prior to visit.    Review of Systems  {Labs  Heme  Chem  Endocrine  Serology  Results Review (optional):23779}   Objective    BP 136/86 (BP Location: Right Arm,  Patient Position: Sitting, Cuff Size: Normal)   Pulse 71   Wt 184 lb 9.6 oz (83.7 kg)   BMI 31.69 kg/m  {Show previous vital signs (optional):23777}  Physical Exam Vitals reviewed.  Constitutional:      General: She is not in acute distress.    Appearance: Normal  appearance. She is not ill-appearing, toxic-appearing or diaphoretic.  Eyes:     Conjunctiva/sclera: Conjunctivae normal.  Cardiovascular:     Rate and Rhythm: Normal rate and regular rhythm.     Pulses: Normal pulses.     Heart sounds: Normal heart sounds. No murmur heard.    No friction rub. No gallop.  Pulmonary:     Effort: Pulmonary effort is normal. No respiratory distress.     Breath sounds: Normal breath sounds. No stridor. No wheezing, rhonchi or rales.  Abdominal:     General: Bowel sounds are normal. There is no distension.     Palpations: Abdomen is soft. There is no mass.     Tenderness: There is no abdominal tenderness.  Musculoskeletal:     Right lower leg: No edema.     Left lower leg: No edema.  Skin:    Findings: No erythema or rash.  Neurological:     Mental Status: She is alert and oriented to person, place, and time.    ***  No results found for any visits on 09/10/22.  Assessment & Plan     Problem List Items Addressed This Visit       Cardiovascular and Mediastinum   Hypertension associated with diabetes (HCC)   Relevant Medications   hydrALAZINE (APRESOLINE) 10 MG tablet     Endocrine   Type 2 diabetes mellitus without complication, without long-term current use of insulin (HCC) - Primary   Relevant Orders   POCT HgB A1C   Other Visit Diagnoses     Nausea and vomiting, unspecified vomiting type       Relevant Medications   ondansetron (ZOFRAN-ODT) 4 MG disintegrating tablet        Return in about 2 months (around 11/10/2022) for DM, HTN .        The entirety of the information documented in the History of Present Illness, Review of Systems and Physical Exam were personally obtained by me. Portions of this information were initially documented by Acey Lav . I, [name], MD have reviewed the documentation above for thoroughness and accuracy.    [name], MD  [facility] [phone] (phone) [phone] (fax)  [facility]

## 2022-09-10 NOTE — Patient Instructions (Signed)
Your hemoglobin A1c was 7.5 today   Please continue your metformin and working on dietary management for your glucose management   For your nausea and vomiting, I have written a prescription for Zofran 4mg  every 8 hours as needed    A referral has been placed on your behalf for gastroenterology. Our referral coordination team or the office you will be visiting will contact you within the next 2 weeks.  If you have not received a phone call within 10 business days please let us know so that we can check into this for you.

## 2022-09-12 DIAGNOSIS — R112 Nausea with vomiting, unspecified: Secondary | ICD-10-CM | POA: Insufficient documentation

## 2022-09-12 NOTE — Assessment & Plan Note (Signed)
Chronic A1c not yet at goal of less than 7 Repeat hemoglobin A1c collected today, stable at 7.5 Patient will continue metformin 500 mg twice daily Diabetes foot exam completed today Counseled patient on importance of dietary management limiting bread, sugary beverages, saturated fats, fried foods

## 2022-09-12 NOTE — Assessment & Plan Note (Signed)
Chronic Blood pressure is improved, not quite at goal of less than 130/80 Patient to continue hydralazine 10 mg twice daily, amlodipine 10 mg, valsartan-hydrochlorothiazide 160-25 mg once daily and metoprolol 50 mg daily Recommended patient continue to monitor blood pressures at home

## 2022-09-12 NOTE — Assessment & Plan Note (Signed)
Chronic problem Recommended gastroenterology referral, referral submitted today Prescribe Zofran 4 mg every 8 hours as needed for nausea Counseled patient that the symptoms could be related to uncontrolled diabetes, gastroparesis however counseled that she would need additional studies to assess for this Counseled patient to monitor diet for triggering foods

## 2022-10-22 NOTE — Progress Notes (Unsigned)
Celso Amy, PA-C [address]  [address]  [place], [place] [address]  Main: [phone]  Fax: [phone]   Gastroenterology Consultation  Referring Provider:     Brett Albino* Primary Care Physician:  [name], MD Primary Gastroenterologist:  Celso Amy, PA-C / Dr. Midge Minium   Reason for Consultation:     Nausea, vomiting, RUQ pain, Constipation, Abnormal labs        HPI:   Madison Ritter is a 62 y.o. y/o female referred for consultation & management  by [name], MD.    Patient states she has had chronic constipation for many years.  She had worsening constipation and GI upset when her hypertension medication (valsartan HCTZ) dose was increased.  She experienced nausea, vomiting, upper abdominal pain, worsening constipation, and increased bowel sounds for 1 week.  The valsartan HCTZ dose was decreased by her PCP and her GI symptoms improved.  She started taking MiraLAX 1 capful in a drink daily which helped her constipation.  She is afraid to take MiraLAX long-term.  She denies rectal bleeding.  No current abdominal pain, nausea, or vomiting today.  Patient drinks occasional 1 mixed drink (margarita with alcohol) every other weekend.  RUQ ultrasound 08/16/2022 was normal.  No gallstones.  CBD 3 mm.  Diffuse hepatic steatosis.  Labs 08/07/2022 showed elevated white count 14.5, normal hemoglobin 15.4.  Alk phos elevated at 177.  All other LFTs normal.  Acute viral hepatitis A, B, and C labs negative.  Mildly elevated lipase at 90. Lipid panel 05/2022 showed triglycerides 156, total cholesterol 208, LDL 112, HDL 69.  Colonoscopy 04/2014 by Dr. Servando Snare showed 2 hyperplastic polyps.  10-year repeat.  No previous EGD.  Past Medical History:  Diagnosis Date   Hypertension     Past Surgical History:  Procedure Laterality Date   ABDOMINAL HYSTERECTOMY  2004   BREAST DUCTAL SYSTEM EXCISION Left 04/14/2019   Procedure: EXCISION DUCTAL  SYSTEM BREAST - NO SENTINEL NODE;  Surgeon: Campbell Lerner, MD;  Location: [facility] ORS;  Service: General;  Laterality: Left;   BREAST EXCISIONAL BIOPSY Left 03/2019   EPIDERMAL INCLUSION CYST.   TUBAL LIGATION      Prior to Admission medications   Medication Sig Start Date End Date Taking? Authorizing Provider  acetaminophen (TYLENOL) 500 MG tablet Take 1,000 mg by mouth every 6 (six) hours as needed (for pain.).    [provider]  amLODipine (NORVASC) 10 MG tablet Take 1 tablet (10 mg total) by mouth daily. 06/07/22   Jacky Kindle, FNP  atorvastatin (LIPITOR) 80 MG tablet Take 1 tablet (80 mg total) by mouth daily. 12/12/21   Jacky Kindle, FNP  clobetasol ointment (TEMOVATE) 0.05 % Apply 1 Application topically 2 (two) times daily. 06/07/22   Jacky Kindle, FNP  hydrALAZINE (APRESOLINE) 10 MG tablet Take 1 tablet (10 mg total) by mouth in the morning and at bedtime. 09/10/22   Simmons-Robinson, Tawanna Cooler, MD  metFORMIN (GLUCOPHAGE) 500 MG tablet Take 2 tablets (1,000 mg total) by mouth 2 (two) times daily with a meal. 12/12/21   Jacky Kindle, FNP  metoprolol succinate (TOPROL-XL) 50 MG 24 hr tablet Take 1 tablet (50 mg total) by mouth daily. 07/24/22   Simmons-Robinson, Tawanna Cooler, MD  ondansetron (ZOFRAN-ODT) 4 MG disintegrating tablet Take 1 tablet (4 mg total) by mouth every 8 (eight) hours as needed for nausea or vomiting. 09/10/22   Simmons-Robinson, Makiera, MD  valsartan-hydrochlorothiazide (DIOVAN-HCT) 160-25 MG tablet Take 1 tablet by  mouth daily. 08/07/22   Simmons-Robinson, Makiera, MD  Vitamin D, Ergocalciferol, (DRISDOL) 1.25 MG (50000 UNIT) CAPS capsule Take 1 capsule (50,000 Units total) by mouth every 7 (seven) days. 12/07/21   Jacky Kindle, FNP    Family History  Problem Relation Age of Onset   Diabetes Mother    Hypertension Mother    Congestive Heart Failure Mother    Sleep apnea Mother    Stroke Mother    Hypertension Father    Head & neck cancer Father     Thyroid disease Sister    Hypertension Brother    Stroke Brother    Hypertension Brother    Breast cancer Maternal Aunt 27   Breast cancer Other      Social History   Tobacco Use   Smoking status: Every Day    Current packs/day: 0.10    Average packs/day: 0.1 packs/day for 10.0 years (1.0 ttl pk-yrs)    Types: Cigars, Cigarettes   Smokeless tobacco: Never   Tobacco comments:    1 cigar daily  Vaping Use   Vaping status: Never Used  Substance Use Topics   Alcohol use: Yes    Alcohol/week: 2.0 standard drinks of alcohol    Types: 2 Standard drinks or equivalent per week   Drug use: No    Allergies as of 10/23/2022 - Review Complete 10/23/2022  Allergen Reaction Noted   Sulfa antibiotics Hives 01/11/2015    Review of Systems:    All systems reviewed and negative except where noted in HPI.   Physical Exam:  BP 138/74   Pulse 71   Temp 98.5 F (36.9 C)   Ht 5\' 4"  (1.626 m)   Wt 188 lb 9.6 oz (85.5 kg)   BMI 32.37 kg/m  No LMP recorded. Patient has had a hysterectomy. Psych:  Alert and cooperative. Normal mood and affect. General:   Alert,  Well-developed, well-nourished, pleasant and cooperative in NAD Head:  Normocephalic and atraumatic. Eyes:  Sclera clear, no icterus.   Conjunctiva pink. Neck:  Supple; no masses or thyromegaly. Lungs:  Respirations even and unlabored.  Clear throughout to auscultation.   No wheezes, crackles, or rhonchi. No acute distress. Heart:  Regular rate and rhythm; no murmurs, clicks, rubs, or gallops. Abdomen:  Normal bowel sounds.  No bruits.  Soft, and non-distended without masses, hepatosplenomegaly or hernias noted.  No Tenderness.  No guarding or rebound tenderness.    Neurologic:  Alert and oriented x3;  grossly normal neurologically. Psych:  Alert and cooperative. Normal mood and affect.  Imaging Studies: No results found.  Assessment and Plan:   Madison Ritter is a 62 y.o. y/o female has been referred for:   1.  Episodic  nausea/vomiting, currently resolved; most likely due to adverse side effect of hypertension medication.  Improved after hypertension medication (Valsartan / hydrochlorothiazide) dose was decreased.  Pancreatitis is in the differential.  Recent RUQ US showed no gallstones.  2.  RUQ pain, intermittent, no tenderness today; Recent ultrasound negative for gallstones  I Recommend to schedule abdominal CT and patient declined (due to cost).  She agrees to do repeat lab work and if labs are persistently abnormal, then she agrees to do CT.  3.  Hepatic steatosis  Recommend a low-fat diet, regular exercise, and weight loss. Patient education handout about fatty liver disease was given and discussed from up-to-date.   4.  Elevated alkaline phosphatase  Labs: Alkaline phosphatase isoenzymes, GGT, intact PTH and calcium, hepatic panel, ANA, AMA,  ASMA, iron panel, ferritin, TSH.   5.  Elevated lipase: Possibly due to increased dose of hydrochlorothiazide (which can cause pancreatitis).  Dose was recently decreased.  Lab: Repeat lipase  If lipase remains elevated, then I will order abdominal CT with and without contrast.  Patient is advised to avoid alcohol.  6.  Leukocytosis  Lab: Repeat CBC  7.  Constipation - Improved on Miralax  Recommend High Fiber diet with fruits, vegetables, and whole grains. Drink 64 ounces of Fluids Daily. Continue Miralax Mix 1 capful in a drink daily.  Follow up in 3 months or sooner if worsening GI symptoms or labs.  Celso Amy, PA-C

## 2022-10-23 ENCOUNTER — Encounter: Payer: Self-pay | Admitting: Physician Assistant

## 2022-10-23 ENCOUNTER — Ambulatory Visit (INDEPENDENT_AMBULATORY_CARE_PROVIDER_SITE_OTHER): Payer: BC Managed Care – PPO | Admitting: Physician Assistant

## 2022-10-23 VITALS — BP 138/74 | HR 71 | Temp 98.5°F | Ht 64.0 in | Wt 188.6 lb

## 2022-10-23 DIAGNOSIS — K760 Fatty (change of) liver, not elsewhere classified: Secondary | ICD-10-CM | POA: Diagnosis not present

## 2022-10-23 DIAGNOSIS — R748 Abnormal levels of other serum enzymes: Secondary | ICD-10-CM

## 2022-10-23 DIAGNOSIS — R1011 Right upper quadrant pain: Secondary | ICD-10-CM | POA: Diagnosis not present

## 2022-10-23 DIAGNOSIS — D72829 Elevated white blood cell count, unspecified: Secondary | ICD-10-CM

## 2022-10-23 DIAGNOSIS — R112 Nausea with vomiting, unspecified: Secondary | ICD-10-CM | POA: Diagnosis not present

## 2022-10-23 DIAGNOSIS — K5901 Slow transit constipation: Secondary | ICD-10-CM

## 2022-10-23 MED ORDER — POLYETHYLENE GLYCOL 3350 17 GM/SCOOP PO POWD: ORAL | Status: AC

## 2022-10-23 NOTE — Patient Instructions (Signed)
Take Miralax powder, Mix 1 capful in a drink once daily for constipation.

## 2022-10-25 ENCOUNTER — Telehealth: Payer: Self-pay

## 2022-10-25 ENCOUNTER — Other Ambulatory Visit: Payer: Self-pay

## 2022-10-25 DIAGNOSIS — R748 Abnormal levels of other serum enzymes: Secondary | ICD-10-CM

## 2022-10-25 NOTE — Telephone Encounter (Signed)
CT Abdomen /Pelvis scheduled 8/16 @ [facility] 1:15 pm.  Nothing to eat/drink 4 hours prior.

## 2022-10-25 NOTE — Progress Notes (Signed)
Call and notify patient: 1.  White count has improved from 14.5 down to 12.1, yet still mildly elevated.  Normal hemoglobin.  No anemia. 2.  Lipase pancreas test is a little more elevated.  GGT and alk phosphatase liver tests are also elevated.  **Please schedule abdominal pelvic CT with contrast for further evaluation.  Diagnosis abdominal pain, elevated lipase, elevated liver test. 3.  TSH thyroid test is normal. 4.  ANA, AMA, and ASMA are normal.  No evidence of autoimmune hepatitis or primary biliary cirrhosis. Celso Amy, PA-C

## 2022-10-26 LAB — MITOCHONDRIAL/SMOOTH MUSCLE AB PNL
Mitochondrial Ab: <20 U (ref 0.0–20.0)
Smooth Muscle Ab: 17 U (ref 0–19)

## 2022-10-26 LAB — PTH, INTACT AND CALCIUM
Calcium: 10.1 mg/dL (ref 8.7–10.3)
PTH: 43 pg/mL (ref 15–65)

## 2022-10-26 LAB — ANA: Anti Nuclear Antibody (ANA): NEGATIVE

## 2022-10-26 LAB — LIPASE: Lipase: 109 U/L — ABNORMAL HIGH (ref 14–72)

## 2022-11-01 ENCOUNTER — Ambulatory Visit: Payer: BC Managed Care – PPO

## 2022-11-06 NOTE — Progress Notes (Signed)
Established patient visit   Patient: Madison Ritter   DOB: 05-28-1960   62 y.o. Female  MRN: [id number] Visit Date: 11/12/2022  Today's healthcare provider: [name], MD   Chief Complaint  Patient presents with   Follow-up    Refills needed on vit D, amlodipine & metoprolo   Subjective     HPI     Follow-up    Additional comments: Refills needed on vit D, amlodipine & metoprolo      Last edited by Clois Comber on 11/12/2022  4:07 PM.       Discussed the use of AI scribe software for clinical note transcription with the patient, who gave verbal consent to proceed.  History of Present Illness   The patient, with a history of hypertension and diabetes, presents with a persistent headache, which she describes as starting in the neck and radiating to the front of the head. She suspects it may be a tension headache, possibly related to stress, and notes that she woke up with it. She also reports feeling bloated and experiencing irregular bowel movements, despite taking metformin twice daily. She expresses dissatisfaction with the metformin, feeling it is not providing the expected relief.  The patient admits to inconsistent adherence to her antihypertensive medications, hydralazine and valsartan-hydrochlorothiazide combo, particularly over the past weekend. She has been monitoring her blood pressure at home, which she reports has been improving, with readings closer to the target range. However, her blood pressure was found to be elevated during the current visit.  The patient also mentions concerns about the cost of upcoming diagnostic tests, including a CT scan, which may be contributing to her stress. She has been diagnosed with irritable bowel syndrome by a gastroenterologist, but expresses confusion about the need for further testing. She also expresses concern about potential liver issues, as she has been told she has a fatty liver.  The patient  acknowledges that she has not been drinking enough water and has gained weight recently. She also mentions that she has been taking amlodipine and metformin, but expresses dissatisfaction with the latter. She has been prescribed a new medication, Ozempic, for her diabetes.         Medications: Outpatient Medications Prior to Visit  Medication Sig   acetaminophen (TYLENOL) 500 MG tablet Take 1,000 mg by mouth every 6 (six) hours as needed (for pain.).   amLODipine (NORVASC) 10 MG tablet Take 1 tablet (10 mg total) by mouth daily.   atorvastatin (LIPITOR) 80 MG tablet Take 1 tablet (80 mg total) by mouth daily.   clobetasol ointment (TEMOVATE) 0.05 % Apply 1 Application topically 2 (two) times daily.   hydrALAZINE (APRESOLINE) 10 MG tablet Take 1 tablet (10 mg total) by mouth in the morning and at bedtime.   metFORMIN (GLUCOPHAGE) 500 MG tablet Take 2 tablets (1,000 mg total) by mouth 2 (two) times daily with a meal.   metoprolol succinate (TOPROL-XL) 50 MG 24 hr tablet Take 1 tablet (50 mg total) by mouth daily.   ondansetron (ZOFRAN-ODT) 4 MG disintegrating tablet Take 1 tablet (4 mg total) by mouth every 8 (eight) hours as needed for nausea or vomiting.   polyethylene glycol powder (GLYCOLAX/MIRALAX) 17 GM/SCOOP powder Mix 1 capful in a Drink Once daily for Constipation.   valsartan-hydrochlorothiazide (DIOVAN-HCT) 160-25 MG tablet Take 1 tablet by mouth daily.   Vitamin D, Ergocalciferol, (DRISDOL) 1.25 MG (50000 UNIT) CAPS capsule Take 1 capsule (50,000 Units total) by mouth every 7 (seven)  days.   No facility-administered medications prior to visit.    Review of Systems      Objective    BP (!) 140/80   Pulse 66   Ht 5\' 4"  (1.626 m)   Wt 188 lb 4.8 oz (85.4 kg)   SpO2 98%   BMI 32.32 kg/m     Physical Exam Vitals reviewed.  Constitutional:      General: She is not in acute distress.    Appearance: Normal appearance. She is not ill-appearing, toxic-appearing or  diaphoretic.  Eyes:     Conjunctiva/sclera: Conjunctivae normal.  Cardiovascular:     Rate and Rhythm: Normal rate and regular rhythm.     Pulses: Normal pulses.     Heart sounds: Normal heart sounds. No murmur heard.    No friction rub. No gallop.  Pulmonary:     Effort: Pulmonary effort is normal. No respiratory distress.     Breath sounds: Normal breath sounds. No stridor. No wheezing, rhonchi or rales.  Abdominal:     General: Bowel sounds are normal. There is no distension.     Palpations: Abdomen is soft.     Tenderness: There is no abdominal tenderness.  Musculoskeletal:     Right lower leg: No edema.     Left lower leg: No edema.  Skin:    Findings: No erythema or rash.  Neurological:     Mental Status: She is alert and oriented to person, place, and time.       No results found for any visits on 11/12/22.  Assessment & Plan     Problem List Items Addressed This Visit     Hypertension associated with diabetes (HCC) - Primary        Hypertension Elevated blood pressure in clinic (142/80). Patient admits to inconsistent medication adherence. -Continue valsartan/hydrochlorothiazide 160/25mg  daily, hydralazine 10mg  twice daily, and amlodipine 10mg  daily. -Encourage patient to consistently take medications as prescribed. -Request patient to monitor and record home blood pressure readings and share via MyChart.  Tension Headache Patient reports headache, possibly related to tension or dehydration. -acute, mild symptoms  -Advise patient to increase water intake. -Recommend use of heating pad and gentle neck stretches to alleviate muscle tension. Ok to use OTC analgesia   Type 2 Diabetes Mellitus A1c of 7.5 in June, indicating suboptimal glycemic control. Patient reports dissatisfaction with metformin. -Chronic, not at goal of less than 7  -Continue metformin. -Discuss alternative or additional medications such as Ozempic, Farxiga, or Jardiance at next  visit.  Irritable Bowel Syndrome Patient reports bloating and irregular bowel movements. -Continue current management plan. -CT scan scheduled to further evaluate symptoms. -chronic problem, follow up with GI as previously scheduled   Follow-up in early October 2024.         Return in about 5 weeks (around 12/17/2022) for HTN, DM.         [name], MD  [facility] [phone] (phone) [phone] (fax)  [facility]

## 2022-11-12 ENCOUNTER — Encounter: Payer: Self-pay | Admitting: Family Medicine

## 2022-11-12 ENCOUNTER — Ambulatory Visit
Admission: RE | Admit: 2022-11-12 | Discharge: 2022-11-12 | Disposition: A | Discharge status: Home / Self Care | Payer: BC Managed Care – PPO | Source: Ambulatory Visit | Attending: Physician Assistant | Admitting: Physician Assistant

## 2022-11-12 ENCOUNTER — Ambulatory Visit (INDEPENDENT_AMBULATORY_CARE_PROVIDER_SITE_OTHER): Payer: BC Managed Care – PPO | Admitting: Family Medicine

## 2022-11-12 VITALS — BP 140/80 | HR 66 | Ht 64.0 in | Wt 188.3 lb

## 2022-11-12 DIAGNOSIS — E1159 Type 2 diabetes mellitus with other circulatory complications: Secondary | ICD-10-CM

## 2022-11-12 DIAGNOSIS — I152 Hypertension secondary to endocrine disorders: Secondary | ICD-10-CM

## 2022-11-12 DIAGNOSIS — K5909 Other constipation: Secondary | ICD-10-CM | POA: Diagnosis not present

## 2022-11-12 DIAGNOSIS — R748 Abnormal levels of other serum enzymes: Secondary | ICD-10-CM | POA: Diagnosis not present

## 2022-11-12 DIAGNOSIS — Z90710 Acquired absence of both cervix and uterus: Secondary | ICD-10-CM | POA: Diagnosis not present

## 2022-11-12 MED ORDER — IOHEXOL 300 MG/ML  SOLN
100.0000 mL | Freq: Once | INTRAMUSCULAR | Status: AC | PRN
  Administered 2022-11-12: 100 mL via INTRAVENOUS

## 2022-11-12 NOTE — Patient Instructions (Signed)
Date BLOOD PRESSURE Heart Rate   8/27 140/80 72

## 2022-11-13 ENCOUNTER — Other Ambulatory Visit: Payer: Self-pay | Admitting: Family Medicine

## 2022-11-13 DIAGNOSIS — E1159 Type 2 diabetes mellitus with other circulatory complications: Secondary | ICD-10-CM

## 2022-11-13 DIAGNOSIS — I10 Essential (primary) hypertension: Secondary | ICD-10-CM

## 2022-11-15 ENCOUNTER — Other Ambulatory Visit: Payer: Self-pay | Admitting: Family Medicine

## 2022-11-15 DIAGNOSIS — I152 Hypertension secondary to endocrine disorders: Secondary | ICD-10-CM

## 2022-11-15 DIAGNOSIS — I10 Essential (primary) hypertension: Secondary | ICD-10-CM

## 2022-11-15 NOTE — Telephone Encounter (Signed)
Metoprolol 07/24/22 #90 1 RF Hydralazine 11/14/22 #270  Requested Prescriptions  Refused Prescriptions Disp Refills   metoprolol succinate (TOPROL-XL) 50 MG 24 hr tablet [Pharmacy Med Name: METOPROLOL ER SUCCINATE 50MG  TABS] 90 tablet 1    Sig: TAKE 1 TABLET(50 MG) BY MOUTH DAILY     Cardiovascular:  Beta Blockers Failed - 11/15/2022  8:09 AM      Failed - Last BP in normal range    BP Readings from Last 1 Encounters:  11/12/22 (!) 140/80         Passed - Last Heart Rate in normal range    Pulse Readings from Last 1 Encounters:  11/12/22 66         Passed - Valid encounter within last 6 months    Recent Outpatient Visits           3 days ago Hypertension associated with diabetes (HCC)   [facility] Simmons-Robinson, Financial controller, MD   2 months ago Type 2 diabetes mellitus without complication, without long-term current use of insulin (HCC)   [place] [facility] Simmons-Robinson, [place], MD   3 months ago RUQ abdominal pain   [place] [facility] [place], [place], MD   3 months ago Hypertension associated with diabetes (HCC)   [place] [facility] Simmons-Robinson, [place], MD   4 months ago Hypertension associated with diabetes (HCC)   [place] [facility] Simmons-Robinson, Financial controller, MD       Future Appointments             In 1 month Simmons-Robinson, [place], MD [facility]             hydrALAZINE (APRESOLINE) 10 MG tablet [Pharmacy Med Name: HYDRALAZINE 10 MG TABLETS (ORANGE)] 270 tablet 0    Sig: TAKE 1 TABLET(10 MG) BY MOUTH THREE TIMES DAILY     Cardiovascular:  Vasodilators Failed - 11/15/2022  8:09 AM      Failed - WBC in normal range and within 360 days    WBC  Date Value Ref Range Status  10/23/2022 12.1 (H) 3.4 - 10.8 x10E3/uL Final         Failed - Last BP in normal range    BP Readings from Last 1 Encounters:   11/12/22 (!) 140/80         Passed - HCT in normal range and within 360 days    Hematocrit  Date Value Ref Range Status  10/23/2022 42.6 34.0 - 46.6 % Final         Passed - HGB in normal range and within 360 days    Hemoglobin  Date Value Ref Range Status  10/23/2022 14.3 11.1 - 15.9 g/dL Final         Passed - RBC in normal range and within 360 days    RBC  Date Value Ref Range Status  10/23/2022 4.67 3.77 - 5.28 x10E6/uL Final         Passed - PLT in normal range and within 360 days    Platelets  Date Value Ref Range Status  10/23/2022 363 150 - 450 x10E3/uL Final         Passed - ANA Screen, Ifa, Serum in normal range and within 360 days    Anti Nuclear Antibody (ANA)  Date Value Ref Range Status  10/23/2022 Negative Negative Final         Passed - Valid encounter within last 12 months  Recent Outpatient Visits           3 days ago Hypertension associated with diabetes [facility])   [place] [facility] Simmons-Robinson, [place], MD   2 months ago Type 2 diabetes mellitus without complication, without long-term current use of insulin (HCC)   [place] [facility] Simmons-Robinson, [place], MD   3 months ago RUQ abdominal pain   [place] [facility] [place], [place], MD   3 months ago Hypertension associated with diabetes [facility])   [place] [facility] Simmons-Robinson, [place], MD   4 months ago Hypertension associated with diabetes [facility])    [facility] Simmons-Robinson, Tawanna Cooler, MD       Future Appointments             In 1 month Simmons-Robinson, Tawanna Cooler, MD [facility], [place]

## 2022-12-17 ENCOUNTER — Ambulatory Visit (INDEPENDENT_AMBULATORY_CARE_PROVIDER_SITE_OTHER): Payer: BC Managed Care – PPO | Admitting: Family Medicine

## 2022-12-17 ENCOUNTER — Encounter: Payer: Self-pay | Admitting: Family Medicine

## 2022-12-17 VITALS — BP 138/82 | Wt 189.7 lb

## 2022-12-17 DIAGNOSIS — E1159 Type 2 diabetes mellitus with other circulatory complications: Secondary | ICD-10-CM

## 2022-12-17 DIAGNOSIS — E559 Vitamin D deficiency, unspecified: Secondary | ICD-10-CM

## 2022-12-17 DIAGNOSIS — Z23 Encounter for immunization: Secondary | ICD-10-CM | POA: Diagnosis not present

## 2022-12-17 DIAGNOSIS — E119 Type 2 diabetes mellitus without complications: Secondary | ICD-10-CM

## 2022-12-17 DIAGNOSIS — I152 Hypertension secondary to endocrine disorders: Secondary | ICD-10-CM

## 2022-12-17 LAB — POCT GLYCOSYLATED HEMOGLOBIN (HGB A1C): Hemoglobin A1C: 7.3 % — AB (ref 4.0–5.6)

## 2022-12-17 MED ORDER — DAPAGLIFLOZIN PROPANEDIOL 5 MG PO TABS
5.0000 mg | ORAL_TABLET | Freq: Every day | ORAL | 3 refills | Status: DC
Start: 2022-12-17 — End: 2023-03-24

## 2022-12-17 MED ORDER — VALSARTAN-HYDROCHLOROTHIAZIDE 160-25 MG PO TABS
1.0000 | ORAL_TABLET | Freq: Every day | ORAL | 3 refills | Status: DC
Start: 2022-12-17 — End: 2023-12-29

## 2022-12-17 MED ORDER — VITAMIN D (ERGOCALCIFEROL) 1.25 MG (50000 UNIT) PO CAPS: 50000.0000 [IU] | ORAL_CAPSULE | ORAL | 0 refills | Status: DC

## 2022-12-17 NOTE — Progress Notes (Signed)
Established patient visit   Patient: Madison Ritter   DOB: 1960-06-01   62 y.o. Female  MRN: [id number] Visit Date: 12/17/2022  Today's healthcare provider: [name], MD   Chief Complaint  Patient presents with   Medical Management of Chronic Issues   Subjective       Discussed the use of AI scribe software for clinical note transcription with the patient, who gave verbal consent to proceed.  History of Present Illness   The patient, with a history of hypertension and diabetes, presents for a follow-up visit. She reports a recent improvement in her blood pressure, which she attributes to consistent medication adherence and reduced self-monitoring to avoid anxiety. Despite this, she admits to not always taking her hydralazine three times daily as prescribed, sometimes only taking it twice. She also reports inconsistent adherence to her metformin regimen, often skipping the nighttime dose.  The patient's recent A1c was 7.3, a slight decrease from previous levels. She expresses fear of her A1c increasing and is open to trying new medications to improve her diabetes management. She also reports a decrease in appetite, often feeling full after only a few bites of food. This has led to concerns about bloating and changes in bowel movements, which she describes as unusually dense.  The patient also reports a recent weight gain and a lack of physical activity, admitting she often lacks the energy or motivation to engage in exercise. She expresses a desire to speak with a therapist, suspecting that her lack of motivation and energy may be related to unresolved grief. She also mentions caring for her sick mother, which adds to her stress.  Additionally, the patient reports a soft, non-painful lump on her right foot, which has not increased in size. She has not sought further medical attention for this issue. She expresses a general reluctance to add more medications to her regimen  but is open to trying new treatments if necessary.         Past Medical History:  Diagnosis Date   Hypertension     Medications: Outpatient Medications Prior to Visit  Medication Sig Note   acetaminophen (TYLENOL) 500 MG tablet Take 1,000 mg by mouth every 6 (six) hours as needed (for pain.).    amLODipine (NORVASC) 10 MG tablet TAKE 1 TABLET(10 MG) BY MOUTH DAILY    atorvastatin (LIPITOR) 80 MG tablet Take 1 tablet (80 mg total) by mouth daily.    hydrALAZINE (APRESOLINE) 10 MG tablet TAKE 1 TABLET(10 MG) BY MOUTH THREE TIMES DAILY    metFORMIN (GLUCOPHAGE) 500 MG tablet Take 2 tablets (1,000 mg total) by mouth 2 (two) times daily with a meal.    metoprolol succinate (TOPROL-XL) 50 MG 24 hr tablet Take 1 tablet (50 mg total) by mouth daily.    polyethylene glycol powder (GLYCOLAX/MIRALAX) 17 GM/SCOOP powder Mix 1 capful in a Drink Once daily for Constipation.    [DISCONTINUED] valsartan-hydrochlorothiazide (DIOVAN-HCT) 160-25 MG tablet Take 1 tablet by mouth daily.    clobetasol ointment (TEMOVATE) 0.05 % Apply 1 Application topically 2 (two) times daily. (Patient not taking: Reported on 12/17/2022)    ondansetron (ZOFRAN-ODT) 4 MG disintegrating tablet Take 1 tablet (4 mg total) by mouth every 8 (eight) hours as needed for nausea or vomiting. (Patient not taking: Reported on 12/17/2022)    [DISCONTINUED] Vitamin D, Ergocalciferol, (DRISDOL) 1.25 MG (50000 UNIT) CAPS capsule Take 1 capsule (50,000 Units total) by mouth every 7 (seven) days. (Patient not taking: Reported on  12/17/2022) 12/17/2022: Needs refills    No facility-administered medications prior to visit.    Review of Systems  Last vitamin D Lab Results  Component Value Date   VD25OH 13.0 (L) 06/07/2022        Objective    BP 138/82 (BP Location: Right Arm, Patient Position: Sitting, Cuff Size: Normal)   Wt 189 lb 11.2 oz (86 kg)   BMI 32.56 kg/m   BP Readings from Last 3 Encounters:  12/17/22 138/82  11/12/22  (!) 140/80  10/23/22 138/74   Wt Readings from Last 3 Encounters:  12/17/22 189 lb 11.2 oz (86 kg)  11/12/22 188 lb 4.8 oz (85.4 kg)  10/23/22 188 lb 9.6 oz (85.5 kg)       Physical Exam Vitals reviewed.  Constitutional:      General: She is not in acute distress.    Appearance: Normal appearance. She is not ill-appearing, toxic-appearing or diaphoretic.  Eyes:     Conjunctiva/sclera: Conjunctivae normal.  Cardiovascular:     Rate and Rhythm: Normal rate and regular rhythm.     Pulses: Normal pulses.     Heart sounds: Normal heart sounds. No murmur heard.    No friction rub. No gallop.  Pulmonary:     Effort: Pulmonary effort is normal. No respiratory distress.     Breath sounds: Normal breath sounds. No stridor. No wheezing, rhonchi or rales.  Abdominal:     General: Bowel sounds are normal. There is no distension.     Palpations: Abdomen is soft.     Tenderness: There is no abdominal tenderness.  Musculoskeletal:     Right lower leg: No edema.     Left lower leg: No edema.  Skin:    Findings: No erythema or rash.  Neurological:     Mental Status: She is alert and oriented to person, place, and time.       Results for orders placed or performed in visit on 12/17/22  POCT HgB A1C  Result Value Ref Range   Hemoglobin A1C 7.3 (A) 4.0 - 5.6 %   HbA1c POC (<> result, manual entry)     HbA1c, POC (prediabetic range)     HbA1c, POC (controlled diabetic range)      Assessment & Plan     Problem List Items Addressed This Visit     Hypertension associated with diabetes (HCC) - Primary    Blood pressure readings in the mid to high 130s/70s. Currently on Amlodipine 10mg  daily, Hydralazine 10mg  twice daily (prescribed for three times daily), Metoprolol 50mg  daily, and Valsartan/Hydrochlorothiazide 160mg /25mg  daily. Chronic, improved, will work to maintain BP less than 140/90  -Continue current regimen. -Refill Valsartan/Hydrochlorothiazide 160mg /25mg .      Relevant  Medications   valsartan-hydrochlorothiazide (DIOVAN-HCT) 160-25 MG tablet   dapagliflozin propanediol (FARXIGA) 5 MG TABS tablet   Type 2 diabetes mellitus without complication, without long-term current use of insulin (HCC)    A1c 7.3, down from previous. Currently on Metformin 1000mg  in the morning, ideally should be on 1000mg  twice a day. Patient expressed fear of Metformin and interest in trying a different medication. Chronic, not at goal a1c of less than 7  -Start [place] 5mg  daily, provided two-week sample. -Continue Metformin 1000mg  daily; encouraged pt to increase to twice daily  Urine albumin collected today        Relevant Medications   valsartan-hydrochlorothiazide (DIOVAN-HCT) 160-25 MG tablet   dapagliflozin propanediol (FARXIGA) 5 MG TABS tablet   Other Relevant Orders   POCT  HgB A1C (Completed)   Urine microalbumin-creatinine with uACR   Vitamin D deficiency, unspecified    Last checked, Vitamin D was extremely low (13). Chronic -Refill Vitamin D supplement 50,000 once weekly       Relevant Medications   Vitamin D, Ergocalciferol, (DRISDOL) 1.25 MG (50000 UNIT) CAPS capsule   Other Visit Diagnoses     Need for influenza vaccination       Relevant Orders   Flu vaccine trivalent PF, 6mos and older(Flulaval,Afluria,Fluarix,Fluzone) (Completed)   Avitaminosis D       Relevant Medications   Vitamin D, Ergocalciferol, (DRISDOL) 1.25 MG (50000 UNIT) CAPS capsule            Depression  Grief  Patient reports lack of energy and motivation, difficulty leaving the house, and feeling full early during meals. Patient has previously seen a therapist and found it helpful. -Provide contact information for Dr. Maryruth Bun for therapy. -Consider starting Sertraline (Zoloft) after consultation with psychiatry, pt is reluctant to add medications to current pill burden  Ganglion Cyst Small, soft mass on dorsal right foot. No pain or increase in size. -Observe, no intervention  needed at this time. -pt states she does not want to see podiatry at this time   Irritable Bowel Syndrome Patient reports changes in bowel movements, no cramping. No current treatment. -Consider discussing with GI specialist about potential treatment options.       Health Maint  Influenza vaccine given today  Urine albumin collected    Return in about 1 month (around 01/17/2023) for HTN, DM.         [name], MD  [facility] [phone] (phone) [phone] (fax)  [facility]

## 2022-12-17 NOTE — Assessment & Plan Note (Addendum)
A1c 7.3, down from previous. Currently on Metformin 1000mg  in the morning, ideally should be on 1000mg  twice a day. Patient expressed fear of Metformin and interest in trying a different medication. Chronic, not at goal a1c of less than 7  -Start [place] 5mg  daily, provided two-week sample. -Continue Metformin 1000mg  daily; encouraged pt to increase to twice daily  Urine albumin collected today

## 2022-12-17 NOTE — Assessment & Plan Note (Signed)
Last checked, Vitamin D was extremely low (13). Chronic -Refill Vitamin D supplement 50,000 once weekly

## 2022-12-17 NOTE — Assessment & Plan Note (Signed)
Blood pressure readings in the mid to high 130s/70s. Currently on Amlodipine 10mg  daily, Hydralazine 10mg  twice daily (prescribed for three times daily), Metoprolol 50mg  daily, and Valsartan/Hydrochlorothiazide 160mg /25mg  daily. Chronic, improved, will work to maintain BP less than 140/90  -Continue current regimen. -Refill Valsartan/Hydrochlorothiazide 160mg /25mg .

## 2022-12-17 NOTE — Patient Instructions (Addendum)
Caryn Section, MD, PA [address], [place], [place] [address] [phone]

## 2023-01-20 ENCOUNTER — Ambulatory Visit: Payer: BC Managed Care – PPO | Admitting: Family Medicine

## 2023-01-20 ENCOUNTER — Encounter: Payer: Self-pay | Admitting: Family Medicine

## 2023-01-20 VITALS — BP 134/78 | HR 78 | Ht 64.0 in | Wt 187.0 lb

## 2023-01-20 DIAGNOSIS — Z720 Tobacco use: Secondary | ICD-10-CM | POA: Diagnosis not present

## 2023-01-20 DIAGNOSIS — E119 Type 2 diabetes mellitus without complications: Secondary | ICD-10-CM

## 2023-01-20 DIAGNOSIS — E1159 Type 2 diabetes mellitus with other circulatory complications: Secondary | ICD-10-CM

## 2023-01-20 DIAGNOSIS — Z7984 Long term (current) use of oral hypoglycemic drugs: Secondary | ICD-10-CM | POA: Diagnosis not present

## 2023-01-20 DIAGNOSIS — I152 Hypertension secondary to endocrine disorders: Secondary | ICD-10-CM

## 2023-01-20 MED ORDER — NICOTINE POLACRILEX 2 MG MT GUM
2.0000 mg | CHEWING_GUM | OROMUCOSAL | 3 refills | Status: AC | PRN
Start: 2023-01-20 — End: ?

## 2023-01-20 NOTE — Assessment & Plan Note (Addendum)
Tobacco Use Patient smokes 1-2 cigars daily and expresses desire to quit, particularly in anticipation of planned breast reduction surgery. Provided 10 minutes of counseling on importance of smoking cessation for overall health & BP improvement and promote optimal healing especially in setting of surgery if she were to have the procedure she desires  -Prescribe nicotine gum to aid in smoking cessation.

## 2023-01-20 NOTE — Assessment & Plan Note (Signed)
Hypertension,chroinc Blood pressure improved to 134/78 on current regimen of Valsartan 160mg , Hydrochlorothiazide 25mg , Metoprolol 50mg , Amlodipine 10mg , and Hydralazine 10mg  BID. Patient acknowledges need for better self-monitoring. -Continue current medication regimen. -Encourage regular blood pressure monitoring at home.

## 2023-01-20 NOTE — Assessment & Plan Note (Signed)
Last A1c was 7.3 on 12/17/2022.  Chronic Patient is on Metformin 1000mg  BID and Farxiga 5mg  daily. -Continue current medication regimen. -Check A1c in January 2025.

## 2023-01-20 NOTE — Progress Notes (Addendum)
Established patient visit   Patient: Madison Ritter   DOB: Sep 24, 1960   62 y.o. Female  MRN: [id number] Visit Date: 01/20/2023  Today's healthcare provider: [name], MD   Chief Complaint  Patient presents with   Follow-up    HTN   Subjective     HPI     Follow-up    Additional comments: HTN      Last edited by Shelly Bombard, CMA on 01/20/2023  4:03 PM.       Discussed the use of AI scribe software for clinical note transcription with the patient, who gave verbal consent to proceed.  History of Present Illness   The patient, a 62 year old individual with a history of hypertension and diabetes, presented for a follow-up visit. The patient's blood pressure was recorded as 134/78, and she is currently on a regimen of valsartan, hydrochlorothiazide, metoprolol, amlodipine, and hydralazine for hypertension. For diabetes, the patient's last A1c was 7.3, and she is on metformin and [place].  The patient admitted to not regularly checking her blood pressure but has been taking her medication daily. She expressed a desire to improve her health and acknowledged the need for lifestyle changes, including weight loss and increased physical activity. However, she reported a lack of motivation and possible depression, although she declined any mood-altering medications.  The patient also reported ongoing stomach issues, which have affected her appetite and eating habits. She has an upcoming appointment with a gastroenterologist to investigate these issues further. The patient's family has expressed concern about these stomach problems.  The patient also mentioned a desire to quit smoking, which she typically does once or twice a day. She acknowledged that smoking cessation would be beneficial for her overall health and could potentially allow her to undergo a desired breast reduction surgery.  Despite these health challenges, the patient expressed a positive outlook and a  commitment to improving her health. She acknowledged the progress made in managing her hypertension and diabetes and expressed a desire to continue working on these areas.         Past Medical History:  Diagnosis Date   Hypertension     Medications: Outpatient Medications Prior to Visit  Medication Sig   acetaminophen (TYLENOL) 500 MG tablet Take 1,000 mg by mouth every 6 (six) hours as needed (for pain.).   amLODipine (NORVASC) 10 MG tablet TAKE 1 TABLET(10 MG) BY MOUTH DAILY   atorvastatin (LIPITOR) 80 MG tablet Take 1 tablet (80 mg total) by mouth daily.   clobetasol ointment (TEMOVATE) 0.05 % Apply 1 Application topically 2 (two) times daily.   dapagliflozin propanediol (FARXIGA) 5 MG TABS tablet Take 1 tablet (5 mg total) by mouth daily.   hydrALAZINE (APRESOLINE) 10 MG tablet TAKE 1 TABLET(10 MG) BY MOUTH THREE TIMES DAILY   metFORMIN (GLUCOPHAGE) 500 MG tablet Take 2 tablets (1,000 mg total) by mouth 2 (two) times daily with a meal.   metoprolol succinate (TOPROL-XL) 50 MG 24 hr tablet Take 1 tablet (50 mg total) by mouth daily.   polyethylene glycol powder (GLYCOLAX/MIRALAX) 17 GM/SCOOP powder Mix 1 capful in a Drink Once daily for Constipation.   valsartan-hydrochlorothiazide (DIOVAN-HCT) 160-25 MG tablet Take 1 tablet by mouth daily.   Vitamin D, Ergocalciferol, (DRISDOL) 1.25 MG (50000 UNIT) CAPS capsule Take 1 capsule (50,000 Units total) by mouth every 7 (seven) days.   [DISCONTINUED] ondansetron (ZOFRAN-ODT) 4 MG disintegrating tablet Take 1 tablet (4 mg total) by mouth every 8 (eight) hours as  needed for nausea or vomiting.   No facility-administered medications prior to visit.    Review of Systems  Last metabolic panel Lab Results  Component Value Date   GLUCOSE 157 (H) 06/07/2022   NA 141 06/07/2022   K 3.5 06/07/2022   CL 101 06/07/2022   CO2 26 06/07/2022   BUN 11 06/07/2022   CREATININE 0.73 06/07/2022   EGFR 94 06/07/2022   CALCIUM 10.1 10/23/2022    PROT 6.6 01/23/2023   ALBUMIN 4.1 01/23/2023   LABGLOB 3.1 06/07/2022   AGRATIO 1.4 06/07/2022   BILITOT 0.2 01/23/2023   ALKPHOS 116 01/23/2023   AST 12 01/23/2023   ALT 17 01/23/2023   ANIONGAP 10 04/12/2019   Last lipids Lab Results  Component Value Date   CHOL 128 01/23/2023   HDL 69 06/07/2022   LDLCALC 112 (H) 06/07/2022   TRIG 198 (H) 01/23/2023   CHOLHDL 3.0 06/07/2022   Last hemoglobin A1c Lab Results  Component Value Date   HGBA1C 7.3 (A) 12/17/2022        Objective    BP 134/78 (BP Location: Right Arm, Patient Position: Sitting, Cuff Size: Normal)   Pulse 78   Ht 5\' 4"  (1.626 m)   Wt 187 lb (84.8 kg)   SpO2 96%   BMI 32.10 kg/m  BP Readings from Last 3 Encounters:  01/23/23 139/85  01/20/23 134/78  12/17/22 138/82   Wt Readings from Last 3 Encounters:  01/23/23 187 lb 2 oz (84.9 kg)  01/20/23 187 lb (84.8 kg)  12/17/22 189 lb 11.2 oz (86 kg)          Physical Exam  General: Alert, no acute distress Cardio: Normal S1 and S2, RRR, no r/m/g Pulm: CTAB, normal work of breathing Abdomen: Bowel sounds normal. Abdomen is mildly distended, non-tender.  Extremities: No peripheral edema.   No results found for any visits on 01/20/23.  Assessment & Plan     Problem List Items Addressed This Visit       Cardiovascular and Mediastinum   Hypertension associated with diabetes (HCC) - Primary    Hypertension,chroinc Blood pressure improved to 134/78 on current regimen of Valsartan 160mg , Hydrochlorothiazide 25mg , Metoprolol 50mg , Amlodipine 10mg , and Hydralazine 10mg  BID. Patient acknowledges need for better self-monitoring. -Continue current medication regimen. -Encourage regular blood pressure monitoring at home.        Endocrine   Type 2 diabetes mellitus without complication, without long-term current use of insulin (HCC)    Last A1c was 7.3 on 12/17/2022.  Chronic Patient is on Metformin 1000mg  BID and Farxiga 5mg  daily. -Continue current  medication regimen. -Check A1c in January 2025.        Other   Current tobacco use    Tobacco Use Patient smokes 1-2 cigars daily and expresses desire to quit, particularly in anticipation of planned breast reduction surgery. Provided 10 minutes of counseling on importance of smoking cessation for overall health & BP improvement and promote optimal healing especially in setting of surgery if she were to have the procedure she desires  -Prescribe nicotine gum to aid in smoking cessation.      Other Visit Diagnoses     Tobacco use       Relevant Medications   nicotine polacrilex (NICORETTE) 2 MG gum           Depression in setting of Grief  Patient reports lack of motivation and possible depression, but declines additional medication. -Encourage patient to seek counseling or other non-pharmacological interventions  for mood improvement.  Weight Management Patient reports attempts at weight loss and has joined a gym. -Encourage continued efforts at weight loss and regular exercise.    Return in about 2 months (around 03/22/2023) for DM, HTN.         [name], MD  [facility] [phone] (phone) [phone] (fax)  [facility]

## 2023-01-22 NOTE — Progress Notes (Signed)
Celso Amy, PA-C [address]  [address]  [place], [place] [address]  Main: [phone]  Fax: [phone]   Primary Care Physician: [name], MD  Primary Gastroenterologist:  Celso Amy, PA-C / Dr. Midge Minium    CC: Follow-up nausea, vomiting, RUQ pain, constipation  HPI: Madison Ritter is a 62 y.o. female returns for 56-month follow-up.  History of chronic constipation for many years, improved on MiraLAX daily.  Nausea and vomiting improved after her blood pressure medication (valsartan/HCTZ) was decreased.  History of hepatic steatosis, elevated alkaline phosphatase, and elevated lipase.  Current Symptoms: She continues to have upper abdominal discomfort in the epigastrium, RUQ, and LUQ.  Has a feeling of early satiety and decreased appetite.  Feels like food gets stuck in the epigastrium.  Feels like there is a knot on her right upper side abdomen.  She has had 1 or 2 episodes of nausea and vomiting in the past 3 months.  Constipation has improved on MiraLAX.  Currently having soft bowel movement once daily.  Has occasional episode of constipation and takes additional OTC laxative as needed.  She denies rectal bleeding or unintentional weight loss.  No family history of colon, esophagus, stomach, or GI cancer.  Labs 10/23/2022 showed white count improved (12.1).  Normal hemoglobin 14.3.  Lipase (109), GGT (132), and alk phos (160) were elevated.  TSH, ANA, AMA, and ASMA were normal / negative.   Labs 07/2022 showed elevated white count 14.5, normal hemoglobin 15.4.  Alk phos elevated at 177.  All other LFTs normal.  Acute viral hepatitis A, B, and C labs negative.  Mildly elevated lipase at 90.  Lipid panel 05/2022 showed triglycerides 156.  Abdominal pelvic CT with contrast 11/12/2022 showed no acute abnormality.  Normal liver.  No gallstones.  Normal pancreas and intestines.  RUQ ultrasound 08/16/2022 was normal.  No gallstones.  CBD 3 mm.  Diffuse hepatic  steatosis. No previous EGD.   Colonoscopy 04/2014 by Dr. Servando Snare showed 2 hyperplastic polyps.  10-year repeat.    Current Outpatient Medications  Medication Sig Dispense Refill   acetaminophen (TYLENOL) 500 MG tablet Take 1,000 mg by mouth every 6 (six) hours as needed (for pain.).     amLODipine (NORVASC) 10 MG tablet TAKE 1 TABLET(10 MG) BY MOUTH DAILY 90 tablet 1   atorvastatin (LIPITOR) 80 MG tablet Take 1 tablet (80 mg total) by mouth daily. 90 tablet 3   clobetasol ointment (TEMOVATE) 0.05 % Apply 1 Application topically 2 (two) times daily. 30 g 0   dapagliflozin propanediol (FARXIGA) 5 MG TABS tablet Take 1 tablet (5 mg total) by mouth daily. 30 tablet 3   hydrALAZINE (APRESOLINE) 10 MG tablet TAKE 1 TABLET(10 MG) BY MOUTH THREE TIMES DAILY 270 tablet 0   metFORMIN (GLUCOPHAGE) 500 MG tablet Take 2 tablets (1,000 mg total) by mouth 2 (two) times daily with a meal. 360 tablet 1   metoprolol succinate (TOPROL-XL) 50 MG 24 hr tablet Take 1 tablet (50 mg total) by mouth daily. 90 tablet 1   nicotine polacrilex (NICORETTE) 2 MG gum Take 1 each (2 mg total) by mouth as needed for smoking cessation. 100 tablet 3   polyethylene glycol powder (GLYCOLAX/MIRALAX) 17 GM/SCOOP powder Mix 1 capful in a Drink Once daily for Constipation.     valsartan-hydrochlorothiazide (DIOVAN-HCT) 160-25 MG tablet Take 1 tablet by mouth daily. 90 tablet 3   Vitamin D, Ergocalciferol, (DRISDOL) 1.25 MG (50000 UNIT) CAPS capsule Take 1 capsule (50,000 Units  total) by mouth every 7 (seven) days. 10 capsule 0   No current facility-administered medications for this visit.    Allergies as of 01/23/2023 - Review Complete 01/23/2023  Allergen Reaction Noted   Sulfa antibiotics Hives 01/11/2015    Past Medical History:  Diagnosis Date   Hypertension     Past Surgical History:  Procedure Laterality Date   ABDOMINAL HYSTERECTOMY  2004   BREAST DUCTAL SYSTEM EXCISION Left 04/14/2019   Procedure: EXCISION DUCTAL  SYSTEM BREAST - NO SENTINEL NODE;  Surgeon: Campbell Lerner, MD;  Location: [facility] ORS;  Service: General;  Laterality: Left;   BREAST EXCISIONAL BIOPSY Left 03/2019   EPIDERMAL INCLUSION CYST.   TUBAL LIGATION      Review of Systems:    All systems reviewed and negative except where noted in HPI.   Physical Examination:   BP 139/85 (BP Location: Left Arm, Patient Position: Sitting, Cuff Size: Normal)   Pulse 73   Temp 98.5 F (36.9 C) (Oral)   Ht 5\' 4"  (1.626 m)   Wt 187 lb 2 oz (84.9 kg)   BMI 32.12 kg/m   General: Well-nourished, well-developed in no acute distress.  Neuro: Alert and oriented x 3.  Grossly intact.  Psych: Alert and cooperative, normal mood and affect.  Imaging Studies: No results found.  Assessment and Plan:   Madison Ritter is a 62 y.o. y/o female returns for follow-up of multiple GI symptoms.  Recent abdominal pelvic CT showed no acute abnormality.  RUQ ultrasound showed no gallstones.  1.  Chronic constipation; improved on MiraLAX  Continue MiraLAX 1 capful once daily.  Add OTC Senokot 8.6 Mg 1 or 2 tablets daily if needed.  2.  Elevated alkaline phosphatase, GGT, and lipase  Repeat labs: Hepatic panel, GGT, lipase  Avoid all alcohol.  If labs are more elevated, then abdominal MRI/MRCP is the next step.   Patient declined to schedule abdominal MRI today, due to cost.  3.  Episodic nausea, vomiting, upper epigastric abdominal pain  Scheduling EGD I discussed risks of EGD with patient to include risk of bleeding, perforation, and risk of sedation.  Patient expressed understanding and agrees to proceed with EGD.  Marland Kitchen 4.  Hepatic steatosis  Recommend a low-fat diet, regular exercise, and weight loss.   Fibrosis 4 Score = .42 (Low risk)        Interpretation for patients with NAFLD          <1.30       -  F0-F1 (Low risk)          1.30-2.67 -  Indeterminate           >2.67      -  F3-F4 (High risk)     Validated for ages 77-65   Celso Amy,  PA-C  Follow up with TG in 3 months.

## 2023-01-23 ENCOUNTER — Ambulatory Visit (INDEPENDENT_AMBULATORY_CARE_PROVIDER_SITE_OTHER): Payer: BC Managed Care – PPO | Admitting: Physician Assistant

## 2023-01-23 ENCOUNTER — Encounter: Payer: Self-pay | Admitting: Physician Assistant

## 2023-01-23 ENCOUNTER — Other Ambulatory Visit: Payer: Self-pay

## 2023-01-23 VITALS — BP 139/85 | HR 73 | Temp 98.5°F | Ht 64.0 in | Wt 187.1 lb

## 2023-01-23 DIAGNOSIS — R112 Nausea with vomiting, unspecified: Secondary | ICD-10-CM | POA: Diagnosis not present

## 2023-01-23 DIAGNOSIS — K5904 Chronic idiopathic constipation: Secondary | ICD-10-CM

## 2023-01-23 DIAGNOSIS — K5909 Other constipation: Secondary | ICD-10-CM

## 2023-01-23 DIAGNOSIS — R1013 Epigastric pain: Secondary | ICD-10-CM | POA: Diagnosis not present

## 2023-01-23 DIAGNOSIS — K760 Fatty (change of) liver, not elsewhere classified: Secondary | ICD-10-CM

## 2023-01-23 DIAGNOSIS — R748 Abnormal levels of other serum enzymes: Secondary | ICD-10-CM

## 2023-01-24 ENCOUNTER — Telehealth: Payer: Self-pay

## 2023-01-24 NOTE — Progress Notes (Signed)
1.  Call and notify patient all of her labs have returned to normal.  Alkaline phosphatase, liver test, GGT, lipase pancreas test have all returned to normal.  Nothing worrisome.  Continue to avoid alcohol.  Continue with plan for EGD as scheduled. 2.  Call [facility] and cancel [facility]!  Not necessary. Celso Amy, PA-C

## 2023-01-24 NOTE — Telephone Encounter (Signed)
Patient verbalized understanding of results and she states she does want to reschedule her EGD to 02/21/2023 to [facility]. Called trish and she will move her to the depo and called Selena Batten and she is going to put her up with Dr. Servando Snare on 02/21/2023. Sent out new instructions to patient

## 2023-01-24 NOTE — Telephone Encounter (Signed)
-----   Message from Celso Amy sent at 01/24/2023  9:39 AM EST ----- 1.  Call and notify patient all of her labs have returned to normal.  Alkaline phosphatase, liver test, GGT, lipase pancreas test have all returned to normal.  Nothing worrisome.  Continue to avoid alcohol.  Continue with plan for EGD as scheduled. 2.  Call [facility] and cancel [facility]!  Not necessary. Celso Amy, PA-C

## 2023-01-25 LAB — LIPASE: Lipase: 65 U/L (ref 14–72)

## 2023-01-25 LAB — NASH FIBROSURE(R) PLUS
ALPHA 2-MACROGLOBULINS, QN: 139 mg/dL (ref 110–276)
ALT (SGPT) P5P: 21 IU/L (ref 0–40)
AST (SGOT) P5P: 18 IU/L (ref 0–40)
Apolipoprotein A-1: 158 mg/dL (ref 116–209)
Bilirubin, Total: 0.2 mg/dL (ref 0.0–1.2)
Cholesterol, Total: 128 mg/dL (ref 100–199)
Fibrosis Score: 0.06 (ref 0.00–0.21)
GGT: 53 [IU]/L (ref 0–60)
Glucose: 106 mg/dL — ABNORMAL HIGH (ref 70–99)
Haptoglobin: 178 mg/dL (ref 33–346)
NASH Score: 0.41 — ABNORMAL HIGH (ref 0.00–0.25)
Steatosis Score: 0.7 — ABNORMAL HIGH (ref 0.00–0.40)
Triglycerides: 198 mg/dL — ABNORMAL HIGH (ref 0–149)

## 2023-01-25 LAB — HEPATIC FUNCTION PANEL
ALT: 17 IU/L (ref 0–32)
AST: 12 [IU]/L (ref 0–40)
Albumin: 4.1 g/dL (ref 3.8–4.9)
Alkaline Phosphatase: 116 [IU]/L (ref 44–121)
Bilirubin Total: 0.2 mg/dL (ref 0.0–1.2)
Bilirubin, Direct: 0.11 mg/dL (ref 0.00–0.40)
Total Protein: 6.6 g/dL (ref 6.0–8.5)

## 2023-01-25 LAB — GAMMA GT: GGT: 49 IU/L (ref 0–60)

## 2023-01-27 NOTE — Progress Notes (Signed)
Call and notify patient: NASH [facility] Test shows: NO Fibrosis (scar tissue in liver).  She has Moderate to Severe Steatosis (fat in liver).  Triglycerides are elevated - F/U with PCP for treatment.  Avoid alcohol, sugar and fatty foods to help triglycerides. Celso Amy, PA-C

## 2023-01-28 ENCOUNTER — Telehealth: Payer: Self-pay

## 2023-01-28 NOTE — Telephone Encounter (Signed)
Patient verbalized understanding of results  

## 2023-01-28 NOTE — Telephone Encounter (Signed)
-----   Message from Celso Amy sent at 01/27/2023  9:28 AM EST ----- Call and notify patient: NASH [facility] Test shows: NO Fibrosis (scar tissue in liver).  She has Moderate to Severe Steatosis (fat in liver).  Triglycerides are elevated - F/U with PCP for treatment.  Avoid alcohol, sugar and fatty foods to help triglycerides. Celso Amy, PA-C

## 2023-02-06 ENCOUNTER — Encounter: Payer: Self-pay | Admitting: Gastroenterology

## 2023-02-18 ENCOUNTER — Other Ambulatory Visit: Payer: Self-pay | Admitting: Family Medicine

## 2023-02-21 ENCOUNTER — Ambulatory Visit: Payer: BC Managed Care – PPO | Admitting: Anesthesiology

## 2023-02-21 ENCOUNTER — Encounter
Admission: RE | Disposition: A | Discharge status: Home / Self Care | Payer: Self-pay | Source: Home / Self Care | Attending: Gastroenterology

## 2023-02-21 ENCOUNTER — Encounter: Payer: Self-pay | Admitting: Gastroenterology

## 2023-02-21 ENCOUNTER — Other Ambulatory Visit: Payer: Self-pay

## 2023-02-21 ENCOUNTER — Ambulatory Visit
Admission: RE | Admit: 2023-02-21 | Discharge: 2023-02-21 | Disposition: A | Discharge status: Home / Self Care | Payer: BC Managed Care – PPO | Attending: Gastroenterology | Admitting: Gastroenterology

## 2023-02-21 DIAGNOSIS — Z7984 Long term (current) use of oral hypoglycemic drugs: Secondary | ICD-10-CM | POA: Diagnosis not present

## 2023-02-21 DIAGNOSIS — F17200 Nicotine dependence, unspecified, uncomplicated: Secondary | ICD-10-CM | POA: Diagnosis not present

## 2023-02-21 DIAGNOSIS — K2100 Gastro-esophageal reflux disease with esophagitis, without bleeding: Secondary | ICD-10-CM | POA: Diagnosis not present

## 2023-02-21 DIAGNOSIS — F1290 Cannabis use, unspecified, uncomplicated: Secondary | ICD-10-CM | POA: Insufficient documentation

## 2023-02-21 DIAGNOSIS — K2900 Acute gastritis without bleeding: Secondary | ICD-10-CM | POA: Diagnosis not present

## 2023-02-21 DIAGNOSIS — K259 Gastric ulcer, unspecified as acute or chronic, without hemorrhage or perforation: Secondary | ICD-10-CM | POA: Diagnosis not present

## 2023-02-21 DIAGNOSIS — E119 Type 2 diabetes mellitus without complications: Secondary | ICD-10-CM | POA: Insufficient documentation

## 2023-02-21 DIAGNOSIS — R1013 Epigastric pain: Secondary | ICD-10-CM | POA: Diagnosis not present

## 2023-02-21 DIAGNOSIS — R110 Nausea: Secondary | ICD-10-CM | POA: Diagnosis not present

## 2023-02-21 DIAGNOSIS — F17290 Nicotine dependence, other tobacco product, uncomplicated: Secondary | ICD-10-CM | POA: Diagnosis not present

## 2023-02-21 DIAGNOSIS — J449 Chronic obstructive pulmonary disease, unspecified: Secondary | ICD-10-CM | POA: Insufficient documentation

## 2023-02-21 DIAGNOSIS — I10 Essential (primary) hypertension: Secondary | ICD-10-CM | POA: Insufficient documentation

## 2023-02-21 HISTORY — DX: Type 2 diabetes mellitus without complications: E11.9

## 2023-02-21 HISTORY — PX: ESOPHAGOGASTRODUODENOSCOPY (EGD) WITH PROPOFOL: SHX5813

## 2023-02-21 LAB — GLUCOSE, CAPILLARY: Glucose-Capillary: 144 mg/dL — ABNORMAL HIGH (ref 70–99)

## 2023-02-21 SURGERY — ESOPHAGOGASTRODUODENOSCOPY (EGD) WITH PROPOFOL
Anesthesia: General | Site: Mouth

## 2023-02-21 MED ORDER — LACTATED RINGERS IV SOLN: INTRAVENOUS | Status: DC

## 2023-02-21 MED ORDER — PANTOPRAZOLE SODIUM 40 MG PO TBEC: 40.0000 mg | DELAYED_RELEASE_TABLET | Freq: Every day | ORAL | 2 refills | Status: DC

## 2023-02-21 MED ORDER — PROPOFOL 10 MG/ML IV BOLUS
INTRAVENOUS | Status: AC
Start: 2023-02-21 — End: ?
  Filled 2023-02-21: qty 20

## 2023-02-21 MED ORDER — LIDOCAINE HCL (CARDIAC) PF 100 MG/5ML IV SOSY
PREFILLED_SYRINGE | INTRAVENOUS | Status: DC | PRN
  Administered 2023-02-21: 100 mg via INTRAVENOUS

## 2023-02-21 MED ORDER — LIDOCAINE HCL (PF) 2 % IJ SOLN
INTRAMUSCULAR | Status: AC
  Filled 2023-02-21: qty 5

## 2023-02-21 MED ORDER — PROPOFOL 10 MG/ML IV BOLUS
INTRAVENOUS | Status: DC | PRN
  Administered 2023-02-21: 100 mg via INTRAVENOUS

## 2023-02-21 SURGICAL SUPPLY — 28 items
BALLN DILATOR 12-15 8 (BALLOONS)
BALLN DILATOR 15-18 8 (BALLOONS)
BALLN DILATOR CRE 0-12 8 (BALLOONS)
BALLN DILATOR ESOPH 8 10 CRE (MISCELLANEOUS) IMPLANT
BALLOON DILATOR 12-15 8 (BALLOONS) IMPLANT
BALLOON DILATOR 15-18 8 (BALLOONS) IMPLANT
BALLOON DILATOR CRE 0-12 8 (BALLOONS) IMPLANT
BLOCK BITE 60FR ADLT L/F GRN (MISCELLANEOUS) ×1 IMPLANT
CLIP HMST 235XBRD CATH ROT (MISCELLANEOUS) IMPLANT
ELECT REM PT RETURN 9FT ADLT (ELECTROSURGICAL)
ELECTRODE REM PT RTRN 9FT ADLT (ELECTROSURGICAL) IMPLANT
FCP ESCP3.2XJMB 240X2.8X (MISCELLANEOUS)
FORCEPS BIOP RAD 4 LRG CAP 4 (CUTTING FORCEPS) IMPLANT
FORCEPS ESCP3.2XJMB 240X2.8X (MISCELLANEOUS) IMPLANT
GOWN CVR UNV OPN BCK APRN NK (MISCELLANEOUS) ×2 IMPLANT
INJECTOR VARIJECT VIN23 (MISCELLANEOUS) IMPLANT
KIT DEFENDO VALVE AND CONN (KITS) IMPLANT
KIT PRC NS LF DISP ENDO (KITS) ×1 IMPLANT
MANIFOLD NEPTUNE II (INSTRUMENTS) ×1 IMPLANT
MARKER SPOT ENDO TATTOO 5ML (MISCELLANEOUS) IMPLANT
RETRIEVER NET PLAT FOOD (MISCELLANEOUS) IMPLANT
SNARE SHORT THROW 13M SML OVAL (MISCELLANEOUS) IMPLANT
SNARE SHORT THROW 30M LRG OVAL (MISCELLANEOUS) IMPLANT
SYR INFLATION 60ML (SYRINGE) IMPLANT
TRAP ETRAP POLY (MISCELLANEOUS) IMPLANT
VARIJECT INJECTOR VIN23 (MISCELLANEOUS)
WATER STERILE IRR 250ML POUR (IV SOLUTION) ×1 IMPLANT
WIRE CRE 18-20MM 8CM F G (MISCELLANEOUS) IMPLANT

## 2023-02-21 NOTE — Transfer of Care (Signed)
Immediate Anesthesia Transfer of Care Note  Patient: Arwen Raysor  Procedure(s) Performed: ESOPHAGOGASTRODUODENOSCOPY (EGD) WITH PROPOFOL (Mouth)  Patient Location: PACU  Anesthesia Type: General  Level of Consciousness: awake, alert  and patient cooperative  Airway and Oxygen Therapy: Patient Spontanous Breathing and Patient connected to supplemental oxygen  Post-op Assessment: Post-op Vital signs reviewed, Patient's Cardiovascular Status Stable, Respiratory Function Stable, Patent Airway and No signs of Nausea or vomiting  Post-op Vital Signs: Reviewed and stable  Complications: No notable events documented.

## 2023-02-21 NOTE — Anesthesia Preprocedure Evaluation (Signed)
Anesthesia Evaluation  Patient identified by MRN, date of birth, ID band Patient awake    Reviewed: Allergy & Precautions, NPO status , Patient's Chart, lab work & pertinent test results  History of Anesthesia Complications Negative for: history of anesthetic complications  Airway Mallampati: III  TM Distance: >3 FB Neck ROM: full    Dental  (+) Chipped, Poor Dentition, Missing   Pulmonary COPD, Current Smoker and Patient abstained from smoking.   Pulmonary exam normal        Cardiovascular Exercise Tolerance: Good hypertension, (-) angina negative cardio ROS Normal cardiovascular exam     Neuro/Psych  PSYCHIATRIC DISORDERS      negative neurological ROS     GI/Hepatic negative GI ROS, Neg liver ROS,neg GERD  ,,  Endo/Other  diabetes, Type 2    Renal/GU negative Renal ROS  negative genitourinary   Musculoskeletal   Abdominal   Peds  Hematology negative hematology ROS (+)   Anesthesia Other Findings Past Medical History: No date: Diabetes mellitus without complication (HCC) No date: Hypertension  Past Surgical History: 2004: ABDOMINAL HYSTERECTOMY 04/14/2019: BREAST DUCTAL SYSTEM EXCISION; Left     Comment:  Procedure: EXCISION DUCTAL SYSTEM BREAST - NO SENTINEL               NODE;  Surgeon: Campbell Lerner, MD;  Location: [facility]               ORS;  Service: General;  Laterality: Left; 03/2019: BREAST EXCISIONAL BIOPSY; Left     Comment:  EPIDERMAL INCLUSION CYST. No date: TUBAL LIGATION  BMI    Body Mass Index: 31.93 kg/m      Reproductive/Obstetrics negative OB ROS                             Anesthesia Physical Anesthesia Plan  ASA: 3  Anesthesia Plan: General   Post-op Pain Management:    Induction: Intravenous  PONV Risk Score and Plan: Propofol infusion and TIVA  Airway Management Planned: Natural Airway and Nasal Cannula  Additional Equipment:   Intra-op  Plan:   Post-operative Plan:   Informed Consent: I have reviewed the patients History and Physical, chart, labs and discussed the procedure including the risks, benefits and alternatives for the proposed anesthesia with the patient or authorized representative who has indicated his/her understanding and acceptance.     Dental Advisory Given  Plan Discussed with: Anesthesiologist, CRNA and Surgeon  Anesthesia Plan Comments: (Patient consented for risks of anesthesia including but not limited to:  - adverse reactions to medications - risk of airway placement if required - damage to eyes, teeth, lips or other oral mucosa - nerve damage due to positioning  - sore throat or hoarseness - Damage to heart, brain, nerves, lungs, other parts of body or loss of life  Patient voiced understanding and assent.)       Anesthesia Quick Evaluation

## 2023-02-21 NOTE — Op Note (Signed)
[facility] Gastroenterology Patient Name: Madison Ritter Procedure Date: 02/21/2023 8:52 AM MRN: [id number] Account #: [id number] Date of Birth: 03-03-1961 Admit Type: Outpatient Age: 62 Room: [facility] OR ROOM 01 Gender: Female Note Status: Finalized Instrument Name: [id number] Procedure:             Upper GI endoscopy Indications:           Nausea Providers:             Midge Minium MD, MD Referring MD:          Nicki Guadalajara (Referring MD) Medicines:             Propofol per Anesthesia Complications:         No immediate complications. Procedure:             Pre-Anesthesia Assessment:                        - Prior to the procedure, a History and Physical was                         performed, and patient medications and allergies were                         reviewed. The patient's tolerance of previous                         anesthesia was also reviewed. The risks and benefits                         of the procedure and the sedation options and risks                         were discussed with the patient. All questions were                         answered, and informed consent was obtained. Prior                         Anticoagulants: The patient has taken no anticoagulant                         or antiplatelet agents. ASA Grade Assessment: II - A                         patient with mild systemic disease. After reviewing                         the risks and benefits, the patient was deemed in                         satisfactory condition to undergo the procedure.                        After obtaining informed consent, the endoscope was                         passed under direct vision. Throughout the procedure,  the patient's blood pressure, pulse, and oxygen                         saturations were monitored continuously. The Endoscope                         was introduced through the mouth, and advanced to the                          second part of duodenum. The upper GI endoscopy was                         accomplished without difficulty. The patient tolerated                         the procedure well. Findings:      LA Grade A (one or more mucosal breaks less than 5 mm, not extending       between tops of 2 mucosal folds) esophagitis with no bleeding was found       at the gastroesophageal junction. Biopsies were taken with a cold       forceps for histology.      One non-bleeding cratered gastric ulcer with no stigmata of bleeding was       found in the gastric antrum. Biopsies were taken with a cold forceps for       histology.      The examined duodenum was normal. Impression:            - LA Grade A reflux esophagitis with no bleeding.                         Biopsied.                        - Non-bleeding gastric ulcer with no stigmata of                         bleeding. Biopsied.                        - Normal examined duodenum. Recommendation:        - Discharge patient to home.                        - Resume previous diet.                        - Continue present medications.                        - Use a proton pump inhibitor PO daily.                        - Perform a colonoscopy in 6 weeks. Procedure Code(s):     --- Professional ---                        [phone], Esophagogastroduodenoscopy, flexible,                         transoral; with biopsy, single or multiple Diagnosis  Code(s):     --- Professional ---                        R11.0, Nausea                        K25.9, Gastric ulcer, unspecified as acute or chronic,                         without hemorrhage or perforation                        K21.00, Gastro-esophageal reflux disease with                         esophagitis, without bleeding CPT copyright 2022 American Medical Association. All rights reserved. The codes documented in this report are preliminary and upon coder review may  be revised to meet current compliance  requirements. Midge Minium MD, MD 02/21/2023 9:07:38 AM This report has been signed electronically. Number of Addenda: 0 Note Initiated On: 02/21/2023 8:52 AM Total Procedure Duration: 0 hours 2 minutes 45 seconds  Estimated Blood Loss:  Estimated blood loss: none.      [facility]

## 2023-02-21 NOTE — H&P (Signed)
Midge Minium, MD [facility] [address], [address] [place], [place] [address] Phone:[phone] Fax : [phone]  Primary Care Physician:  [name], MD Primary Gastroenterologist:  Dr. Servando Snare  Pre-Procedure History & Physical: HPI:  Sylivia Brandau is a 62 y.o. female is here for an endoscopy.   Past Medical History:  Diagnosis Date   Diabetes mellitus without complication (HCC)    Hypertension     Past Surgical History:  Procedure Laterality Date   ABDOMINAL HYSTERECTOMY  2004   BREAST DUCTAL SYSTEM EXCISION Left 04/14/2019   Procedure: EXCISION DUCTAL SYSTEM BREAST - NO SENTINEL NODE;  Surgeon: Campbell Lerner, MD;  Location: [facility] ORS;  Service: General;  Laterality: Left;   BREAST EXCISIONAL BIOPSY Left 03/2019   EPIDERMAL INCLUSION CYST.   TUBAL LIGATION      Prior to Admission medications   Medication Sig Start Date End Date Taking? Authorizing Provider  amLODipine (NORVASC) 10 MG tablet TAKE 1 TABLET(10 MG) BY MOUTH DAILY 11/14/22  Yes Simmons-Robinson, Makiera, MD  atorvastatin (LIPITOR) 80 MG tablet TAKE 1 TABLET(80 MG) BY MOUTH DAILY 02/18/23  Yes Merita Norton T, FNP  clobetasol ointment (TEMOVATE) 0.05 % Apply 1 Application topically 2 (two) times daily. 06/07/22  Yes Jacky Kindle, FNP  dapagliflozin propanediol (FARXIGA) 5 MG TABS tablet Take 1 tablet (5 mg total) by mouth daily. 12/17/22  Yes Simmons-Robinson, Makiera, MD  hydrALAZINE (APRESOLINE) 10 MG tablet TAKE 1 TABLET(10 MG) BY MOUTH THREE TIMES DAILY 11/14/22  Yes Simmons-Robinson, Tawanna Cooler, MD  metFORMIN (GLUCOPHAGE) 500 MG tablet Take 2 tablets (1,000 mg total) by mouth 2 (two) times daily with a meal. 12/12/21  Yes Jacky Kindle, FNP  metoprolol succinate (TOPROL-XL) 50 MG 24 hr tablet Take 1 tablet (50 mg total) by mouth daily. 07/24/22  Yes Simmons-Robinson, Makiera, MD  nicotine polacrilex (NICORETTE) 2 MG gum Take 1 each (2 mg total) by mouth as needed for smoking cessation. 01/20/23  Yes  Simmons-Robinson, Makiera, MD  polyethylene glycol powder (GLYCOLAX/MIRALAX) 17 GM/SCOOP powder Mix 1 capful in a Drink Once daily for Constipation. 10/23/22  Yes Celso Amy, PA-C  valsartan-hydrochlorothiazide (DIOVAN-HCT) 160-25 MG tablet Take 1 tablet by mouth daily. 12/17/22  Yes Simmons-Robinson, Makiera, MD  Vitamin D, Ergocalciferol, (DRISDOL) 1.25 MG (50000 UNIT) CAPS capsule Take 1 capsule (50,000 Units total) by mouth every 7 (seven) days. 12/17/22  Yes Simmons-Robinson, Makiera, MD  acetaminophen (TYLENOL) 500 MG tablet Take 1,000 mg by mouth every 6 (six) hours as needed (for pain.).    [provider]    Allergies as of 01/24/2023 - Review Complete 01/23/2023  Allergen Reaction Noted   Sulfa antibiotics Hives 01/11/2015    Family History  Problem Relation Age of Onset   Diabetes Mother    Hypertension Mother    Congestive Heart Failure Mother    Sleep apnea Mother    Stroke Mother    Hypertension Father    Head & neck cancer Father    Thyroid disease Sister    Hypertension Brother    Stroke Brother    Hypertension Brother    Breast cancer Maternal Aunt 57   Breast cancer Other     Social History   Socioeconomic History   Marital status: Widowed    Spouse name: Not on file   Number of children: 1   Years of education: Not on file   Highest education level: Not on file  Occupational History   Not on file  Tobacco Use   Smoking status:  Every Day    Types: Cigars   Smokeless tobacco: Never   Tobacco comments:    1 cigar daily  Vaping Use   Vaping status: Never Used  Substance and Sexual Activity   Alcohol use: Yes    Alcohol/week: 1.0 standard drink of alcohol    Types: 1 Standard drinks or equivalent per week   Drug use: Yes    Types: Marijuana   Sexual activity: Not on file  Other Topics Concern   Not on file  Social History Narrative   Not on file   Social Determinants of Health   Financial Resource Strain: Not on file  Food  Insecurity: No Food Insecurity (11/02/2021)   Hunger Vital Sign    Worried About Running Out of Food in the Last Year: Never true    Ran Out of Food in the Last Year: Never true  Transportation Needs: No Transportation Needs (11/02/2021)   PRAPARE - Administrator, Civil Service (Medical): No    Lack of Transportation (Non-Medical): No  Physical Activity: Not on file  Stress: Stress Concern Present (07/26/2021)   [employer] of Occupational Health - Occupational Stress Questionnaire    Feeling of Stress : Very much  Social Connections: Not on file  Intimate Partner Violence: Not on file    Review of Systems: See HPI, otherwise negative ROS  Physical Exam: BP (!) 154/96   Temp (!) 97.3 F (36.3 C) (Temporal)   Resp 18   Ht 5' 4.02" (1.626 m)   Wt 83.4 kg   SpO2 96%   BMI 31.55 kg/m  General:   Alert,  pleasant and cooperative in NAD Head:  Normocephalic and atraumatic. Neck:  Supple; no masses or thyromegaly. Lungs:  Clear throughout to auscultation.    Heart:  Regular rate and rhythm. Abdomen:  Soft, nontender and nondistended. Normal bowel sounds, without guarding, and without rebound.   Neurologic:  Alert and  oriented x4;  grossly normal neurologically.  Impression/Plan: Jaretzi Victory is here for an endoscopy to be performed for nausea and epigastric pain  Risks, benefits, limitations, and alternatives regarding  endoscopy have been reviewed with the patient.  Questions have been answered.  All parties agreeable.   Midge Minium, MD  02/21/2023, 8:32 AM

## 2023-02-21 NOTE — Anesthesia Postprocedure Evaluation (Signed)
Anesthesia Post Note  Patient: Madison Ritter  Procedure(s) Performed: ESOPHAGOGASTRODUODENOSCOPY (EGD) WITH PROPOFOL (Mouth)  Patient location during evaluation: PACU Anesthesia Type: General Level of consciousness: awake and alert Pain management: pain level controlled Vital Signs Assessment: post-procedure vital signs reviewed and stable Respiratory status: spontaneous breathing, nonlabored ventilation, respiratory function stable and patient connected to nasal cannula oxygen Cardiovascular status: blood pressure returned to baseline and stable Postop Assessment: no apparent nausea or vomiting Anesthetic complications: no   No notable events documented.   Last Vitals:  Vitals:   02/21/23 0915 02/21/23 0920  BP: (!) 142/97 (!) 147/83  Pulse: 68 70  Resp: (!) 25 17  Temp:  (!) 36.3 C  SpO2: 98% 98%    Last Pain:  Vitals:   02/21/23 0920  TempSrc:   PainSc: 0-No pain                 Cleda Mccreedy Taeja Debellis

## 2023-02-22 ENCOUNTER — Encounter: Payer: Self-pay | Admitting: Gastroenterology

## 2023-02-25 LAB — SURGICAL PATHOLOGY

## 2023-02-27 ENCOUNTER — Encounter: Payer: Self-pay | Admitting: Gastroenterology

## 2023-03-04 ENCOUNTER — Other Ambulatory Visit: Payer: Self-pay | Admitting: Family Medicine

## 2023-03-04 DIAGNOSIS — I10 Essential (primary) hypertension: Secondary | ICD-10-CM

## 2023-03-04 NOTE — Telephone Encounter (Signed)
Medication Refill -  Most Recent Primary Care Visit:  Provider: [name]  Department: [facility]  Visit Type: OFFICE VISIT  Date: 01/20/2023  Medication:  metoprolol succinate (TOPROL-XL) 50 MG 24 hr tablet   Has the patient contacted their pharmacy? Yes and was told they had sent a request for a new script  Is this the correct pharmacy for this prescription? Yes  This is the patient's preferred pharmacy: [id number] - [place], [place] - [address] AT [facility] OF [address] [place] [address] [place] [address] Phone: [phone] Fax: [phone]  Has the prescription been filled recently? Yes  Is the patient out of the medication? Yes  Has the patient been seen for an appointment in the last year OR does the patient have an upcoming appointment? Yes  Can we respond through MyChart? No  Agent: Please be advised that Rx refills may take up to 3 business days. We ask that you follow-up with your pharmacy.  Patient has been out of her medication for 5 days

## 2023-03-05 MED ORDER — METOPROLOL SUCCINATE ER 50 MG PO TB24: 50.0000 mg | ORAL_TABLET | Freq: Every day | ORAL | 1 refills | Status: DC

## 2023-03-05 NOTE — Telephone Encounter (Signed)
Requested Prescriptions  Pending Prescriptions Disp Refills   metoprolol succinate (TOPROL-XL) 50 MG 24 hr tablet 90 tablet 1    Sig: Take 1 tablet (50 mg total) by mouth daily.     Cardiovascular:  Beta Blockers Failed - 03/05/2023  9:04 AM      Failed - Last BP in normal range    BP Readings from Last 1 Encounters:  02/21/23 (!) 147/83         Passed - Last Heart Rate in normal range    Pulse Readings from Last 1 Encounters:  02/21/23 70         Passed - Valid encounter within last 6 months    Recent Outpatient Visits           1 month ago Hypertension associated with diabetes (HCC)   [place] [facility] Simmons-Robinson, [place], MD   2 months ago Hypertension associated with diabetes (HCC)   [place] [facility] Simmons-Robinson, [place], MD   3 months ago Hypertension associated with diabetes (HCC)   [place] [facility] Simmons-Robinson, [place], MD   5 months ago Type 2 diabetes mellitus without complication, without long-term current use of insulin (HCC)   [place] [facility] Simmons-Robinson, [place], MD   7 months ago RUQ abdominal pain   [place] [facility] Simmons-Robinson, [place], MD       Future Appointments             In 3 weeks Simmons-Robinson, Tawanna Cooler, MD [facility]

## 2023-03-07 ENCOUNTER — Telehealth: Payer: Self-pay | Admitting: Family Medicine

## 2023-03-07 NOTE — Telephone Encounter (Signed)
[facility] pharmacy is requesting prescription refill dapagliflozin propanediol (FARXIGA) 5 MG TABS tablet   Please advise

## 2023-03-07 NOTE — Telephone Encounter (Signed)
Denied. Too soon to refill.   30 day prescription + 3 refills sent to [facility] on 12/17/2022.

## 2023-03-24 ENCOUNTER — Other Ambulatory Visit: Payer: Self-pay | Admitting: Family Medicine

## 2023-03-24 DIAGNOSIS — E119 Type 2 diabetes mellitus without complications: Secondary | ICD-10-CM

## 2023-03-24 NOTE — Telephone Encounter (Signed)
 Medication Refill -  Most Recent Primary Care Visit:  Provider: SIMMONS-ROBINSON, MAKIERA  Department: [facility]  Visit Type: OFFICE VISIT  Date: 01/20/2023  Medication: dapagliflozin  propanediol (FARXIGA ) 5 MG TABS tablet   Has the patient contacted their pharmacy? Yes  (Agent: If yes, when and what did the pharmacy advise?) Contact office   Is this the correct pharmacy for this prescription? Yes  This is the patient's preferred pharmacy:  [id number] - [place], [place] - [address] AT [facility] OF [address] [place] [address] [place] [address] Phone: [phone] Fax: [phone]     Has the prescription been filled recently? Yes  Is the patient out of the medication? Yes  Has the patient been seen for an appointment in the last year OR does the patient have an upcoming appointment? Yes  Can we respond through MyChart? Yes  Agent: Please be advised that Rx refills may take up to 3 business days. We ask that you follow-up with your pharmacy.

## 2023-03-26 MED ORDER — DAPAGLIFLOZIN PROPANEDIOL 5 MG PO TABS: 5.0000 mg | ORAL_TABLET | Freq: Every day | ORAL | 0 refills | Status: DC

## 2023-03-26 NOTE — Telephone Encounter (Signed)
 Requested Prescriptions  Pending Prescriptions Disp Refills   dapagliflozin  propanediol (FARXIGA ) 5 MG TABS tablet 30 tablet 0    Sig: Take 1 tablet (5 mg total) by mouth daily.     Endocrinology:  Diabetes - SGLT2 Inhibitors Passed - 03/26/2023  4:01 PM      Passed - Cr in normal range and within 360 days    Creatinine, Ser  Date Value Ref Range Status  06/07/2022 0.73 0.57 - 1.00 mg/dL Final         Passed - HBA1C is between 0 and 7.9 and within 180 days    Hemoglobin A1C  Date Value Ref Range Status  12/17/2022 7.3 (A) 4.0 - 5.6 % Final   Hgb A1c MFr Bld  Date Value Ref Range Status  06/07/2022 7.5 (H) 4.8 - 5.6 % Final    Comment:             Prediabetes: 5.7 - 6.4          Diabetes: >6.4          Glycemic control for adults with diabetes: <7.0          Passed - eGFR in normal range and within 360 days    GFR calc Af Amer  Date Value Ref Range Status  03/15/2020 99 >59 mL/min/1.73 Final    Comment:    **In accordance with recommendations from the NKF-ASN Task force,**   [facility] is in the process of updating its eGFR calculation to the   2021 CKD-EPI creatinine equation that estimates kidney function   without a race variable.    GFR calc non Af Amer  Date Value Ref Range Status  03/15/2020 86 >59 mL/min/1.73 Final   eGFR  Date Value Ref Range Status  06/07/2022 94 >59 mL/min/1.73 Final         Passed - Valid encounter within last 6 months    Recent Outpatient Visits           2 months ago Hypertension associated with diabetes (HCC)   [place] [facility] Simmons-Robinson, [place], MD   3 months ago Hypertension associated with diabetes [facility])   [place] [facility] Simmons-Robinson, [place], MD   4 months ago Hypertension associated with diabetes [facility])   [place] [facility] Simmons-Robinson, [place], MD   6 months ago Type 2 diabetes mellitus without complication, without long-term current use of insulin  (HCC)   [place] [facility] Simmons-Robinson, [place], MD   7 months ago RUQ abdominal pain    [facility] Simmons-Robinson, [place], MD       Future Appointments             In 2 days Simmons-Robinson, [place], MD [facility] [facility]

## 2023-03-28 ENCOUNTER — Ambulatory Visit: Payer: BC Managed Care – PPO | Admitting: Family Medicine

## 2023-03-28 ENCOUNTER — Encounter: Payer: Self-pay | Admitting: Family Medicine

## 2023-03-28 VITALS — BP 128/74 | HR 78 | Ht 64.0 in | Wt 183.5 lb

## 2023-03-28 DIAGNOSIS — Z7984 Long term (current) use of oral hypoglycemic drugs: Secondary | ICD-10-CM

## 2023-03-28 DIAGNOSIS — E1169 Type 2 diabetes mellitus with other specified complication: Secondary | ICD-10-CM

## 2023-03-28 DIAGNOSIS — E1159 Type 2 diabetes mellitus with other circulatory complications: Secondary | ICD-10-CM | POA: Diagnosis not present

## 2023-03-28 DIAGNOSIS — E119 Type 2 diabetes mellitus without complications: Secondary | ICD-10-CM | POA: Diagnosis not present

## 2023-03-28 DIAGNOSIS — I152 Hypertension secondary to endocrine disorders: Secondary | ICD-10-CM | POA: Diagnosis not present

## 2023-03-28 DIAGNOSIS — E785 Hyperlipidemia, unspecified: Secondary | ICD-10-CM

## 2023-03-28 DIAGNOSIS — F4329 Adjustment disorder with other symptoms: Secondary | ICD-10-CM | POA: Diagnosis not present

## 2023-03-28 MED ORDER — EMPAGLIFLOZIN 10 MG PO TABS: 10.0000 mg | ORAL_TABLET | Freq: Every day | ORAL | 3 refills | Status: DC

## 2023-03-28 NOTE — Progress Notes (Signed)
 Established patient visit   Patient: Madison Ritter   DOB: May 14, 1960   63 y.o. Female  MRN: [id number] Visit Date: 03/28/2023  Today's healthcare provider: Rockie Agent, MD   Chief Complaint  Patient presents with   Medical Management of Chronic Issues   Diabetes   Hypertension   Subjective     HPI     Diabetes   Blurred vision: Absent.  Chest pain: Absent.  Fatigue: Present.  Foot Ulcerations: Absent.  Nausea: Absent.  Polydipsia: Absent.  Polyuria: Absent.  Visual changes: Absent.  Vomiting: Absent.  Weight loss: Absent.  Episodes of hypoglycemia: Absent.  Eye exam is current.  Patient does not see a podiatrist.  The patient rarely exercises.        Hypertension   Blurred vision: Absent.  Chest pain: Absent.  Chest pressure/discomfort: Absent.  Dyspnea: Absent.  Headaches: Absent.  Lower extremity edema: Absent.  Orthopnea: Absent.  Palpitations: Absent.  Paroxysmal nocturnal dyspnea: Absent.  Syncope: Absent.  The patient rarely exercises.      Last edited by Slade, Taje D, CMA on 03/28/2023  1:17 PM.       Discussed the use of AI scribe software for clinical note transcription with the patient, who gave verbal consent to proceed.  History of Present Illness   The patient, a 63 year old individual with a history of hypertension and diabetes, presents for a follow-up visit. The patient's blood pressure is well controlled, with a reading of 128/74, managed with amlodipine  2mg  daily, hydralazine  2mg  three times daily, and metoprolol  50mg  daily. The patient's diabetes is managed with metformin  1000mg  twice daily, and the most recent hemoglobin A1c was 7.3 in October 2024. The patient was unable to tolerate Farxiga  5mg , and blood pressure is also managed with valsartan  160mg . The patient continues to take Protonix  for chronic GERD and atorvastatin  80mg  daily for hyperlipidemia associated with diabetes.  The patient reports a noticeable decrease in appetite, which  she attributes to the Farxiga . The patient's weight has decreased, as indicated by a home scale reading of 179lbs. The patient's diet consists mainly of an oatmeal cookie and chips before bed. The patient has run out of Farxiga  and is concerned about the high cost of the medication.  The patient also reports a history of an ulcer, discovered during a recent endoscopy. The patient is currently taking Protonix  and has not experienced any further symptoms related to the ulcer. The patient also reports a decrease in food intake and alcohol consumption. The patient admits to occasional alcohol consumption, typically one margarita per week.  The patient also mentions a noticeable weight loss in the legs, which she attributes to a decrease in muscle mass. The patient has not been engaging in regular physical activity but expresses interest in starting chair yoga. The patient also mentions a history of smoking but has not used Nicorette  gum as previously discussed. The patient's daughter has expressed concern about the patient's mental wellbeing, but the patient has not yet sought help from the list of resources provided.       \ Past Medical History:  Diagnosis Date   Diabetes mellitus without complication (HCC)    Hypertension     Medications: Outpatient Medications Prior to Visit  Medication Sig   acetaminophen  (TYLENOL ) 500 MG tablet Take 1,000 mg by mouth every 6 (six) hours as needed (for pain.).   amLODipine  (NORVASC ) 10 MG tablet TAKE 1 TABLET(10 MG) BY MOUTH DAILY   atorvastatin  (LIPITOR) 80 MG tablet  TAKE 1 TABLET(80 MG) BY MOUTH DAILY   clobetasol  ointment (TEMOVATE ) 0.05 % Apply 1 Application topically 2 (two) times daily.   hydrALAZINE  (APRESOLINE ) 10 MG tablet TAKE 1 TABLET(10 MG) BY MOUTH THREE TIMES DAILY   metFORMIN  (GLUCOPHAGE ) 500 MG tablet Take 2 tablets (1,000 mg total) by mouth 2 (two) times daily with a meal.   metoprolol  succinate (TOPROL -XL) 50 MG 24 hr tablet Take 1 tablet  (50 mg total) by mouth daily.   nicotine  polacrilex (NICORETTE ) 2 MG gum Take 1 each (2 mg total) by mouth as needed for smoking cessation.   pantoprazole  (PROTONIX ) 40 MG tablet Take 1 tablet (40 mg total) by mouth daily.   polyethylene glycol powder (GLYCOLAX /MIRALAX ) 17 GM/SCOOP powder Mix 1 capful in a Drink Once daily for Constipation.   valsartan -hydrochlorothiazide  (DIOVAN -HCT) 160-25 MG tablet Take 1 tablet by mouth daily.   Vitamin D , Ergocalciferol , (DRISDOL ) 1.25 MG (50000 UNIT) CAPS capsule Take 1 capsule (50,000 Units total) by mouth every 7 (seven) days.   [DISCONTINUED] dapagliflozin  propanediol (FARXIGA ) 5 MG TABS tablet Take 1 tablet (5 mg total) by mouth daily. (Patient not taking: Reported on 03/28/2023)   No facility-administered medications prior to visit.    Review of Systems  Last hemoglobin A1c Lab Results  Component Value Date   HGBA1C 7.3 (A) 12/17/2022     Lab Results  Component Value Date   CHOL 128 01/23/2023   HDL 69 06/07/2022   LDLCALC 112 (H) 06/07/2022   TRIG 198 (H) 01/23/2023   CHOLHDL 3.0 06/07/2022       Objective    BP 128/74 (BP Location: Left Arm, Patient Position: Sitting, Cuff Size: Large)   Pulse 78   Ht 5' 4 (1.626 m)   Wt 183 lb 8 oz (83.2 kg)   SpO2 100%   BMI 31.50 kg/m  BP Readings from Last 3 Encounters:  03/28/23 128/74  02/21/23 (!) 147/83  01/23/23 139/85   Wt Readings from Last 3 Encounters:  03/28/23 183 lb 8 oz (83.2 kg)  02/21/23 183 lb 14.4 oz (83.4 kg)  01/23/23 187 lb 2 oz (84.9 kg)        Physical Exam Vitals reviewed.  Constitutional:      General: She is not in acute distress.    Appearance: Normal appearance. She is not ill-appearing, toxic-appearing or diaphoretic.  Eyes:     Conjunctiva/sclera: Conjunctivae normal.  Cardiovascular:     Rate and Rhythm: Normal rate and regular rhythm.     Pulses: Normal pulses.     Heart sounds: Normal heart sounds. No murmur heard.    No friction rub. No  gallop.  Pulmonary:     Effort: Pulmonary effort is normal. No respiratory distress.     Breath sounds: Normal breath sounds. No stridor. No wheezing, rhonchi or rales.  Musculoskeletal:     Right lower leg: No edema.     Left lower leg: No edema.  Skin:    Findings: No erythema or rash.  Neurological:     Mental Status: She is alert and oriented to person, place, and time.       No results found for any visits on 03/28/23.  Assessment & Plan     Problem List Items Addressed This Visit       Cardiovascular and Mediastinum   Hypertension associated with diabetes (HCC)   Hypertension,chroinc Within goal range today at 128/74 Blood pressure improved and within normal limits -continue Valsartan  160mg , Hydrochlorothiazide  25mg , Metoprolol  50mg , Amlodipine   10mg , and Hydralazine  10mg  BID. -Encourage regular blood pressure monitoring at home.      Relevant Medications   empagliflozin  (JARDIANCE ) 10 MG TABS tablet   Other Relevant Orders   Urine microalbumin-creatinine with uACR   Comprehensive metabolic panel     Endocrine   Type 2 diabetes mellitus without complication, without long-term current use of insulin (HCC) - Primary   Last A1c was 7.3 on 12/17/2022.  Chronic, not yet at goal of less than 7 Patient is on Metformin  1000mg  BID and Farxiga  5mg  daily but is not taking the farxiga  due to cost  -change farxiga  to jardiance  to aid in patient assistance program; no samples available in the office today  -refer to pharmacy VBCI, pt counseled to wait for a call  -Continue current medication regimen. -Check A1c       Relevant Medications   empagliflozin  (JARDIANCE ) 10 MG TABS tablet   Other Relevant Orders   Urine microalbumin-creatinine with uACR   Hemoglobin A1c   AMB Referral VBCI Care Management   Hyperlipidemia associated with type 2 diabetes mellitus (HCC)   Chronic Lab Results  Component Value Date   CHOL 128 01/23/2023   HDL 69 06/07/2022   LDLCALC 112 (H)  06/07/2022   TRIG 198 (H) 01/23/2023   CHOLHDL 3.0 06/07/2022   Continue atorvastatin  80mg  every day       Relevant Medications   empagliflozin  (JARDIANCE ) 10 MG TABS tablet     Other   Stress and adjustment reaction       Gastroesophageal Reflux Disease (GERD)   Chronic condition managed with Protonix . Recent endoscopy revealed an ulcer, currently on Protonix  for treatment.   - Continue Protonix     Mental Wellbeing   Has a list of mental health providers from when her husband passed away but has not yet contacted them. Aware of the need to address mental health.   - Encourage contacting mental health providers from the provided list    General Health Maintenance   Discussed the importance of exercise to prevent muscle loss, especially in the legs. Recommended chair yoga and calf raises. Discussed using ankle weights and dumbbells for strength training.   - Encourage chair yoga and calf raises   - Consider using ankle weights and dumbbells for strength training        Return in about 3 months (around 06/26/2023) for DM, HTN.      Rockie Agent, MD  [facility] [phone] (phone) [phone] (fax)  [facility]

## 2023-03-28 NOTE — Assessment & Plan Note (Signed)
 Chronic Lab Results  Component Value Date   CHOL 128 01/23/2023   HDL 69 06/07/2022   LDLCALC 112 (H) 06/07/2022   TRIG 198 (H) 01/23/2023   CHOLHDL 3.0 06/07/2022   Continue atorvastatin 80mg  every day

## 2023-03-28 NOTE — Assessment & Plan Note (Addendum)
 Hypertension,chroinc Within goal range today at 128/74 Blood pressure improved and within normal limits -continue Valsartan  160mg , Hydrochlorothiazide  25mg , Metoprolol  50mg , Amlodipine  10mg , and Hydralazine  10mg  BID. -Encourage regular blood pressure monitoring at home.

## 2023-03-28 NOTE — Patient Instructions (Addendum)
 Calf raises 15-20 times for three cycles to help with building muscle mass on your legs   Use ankle weights to lift your legs and flex your ankles during commercial breaks for 10-15 times each foot

## 2023-03-28 NOTE — Assessment & Plan Note (Addendum)
 Last A1c was 7.3 on 12/17/2022.  Chronic, not yet at goal of less than 7 Patient is on Metformin  1000mg  BID and Farxiga  5mg  daily but is not taking the farxiga  due to cost  -change farxiga  to jardiance  to aid in patient assistance program; no samples available in the office today  -refer to pharmacy VBCI, pt counseled to wait for a call  -Continue current medication regimen. -Check A1c

## 2023-03-30 LAB — MICROALBUMIN / CREATININE URINE RATIO: Creatinine, Urine: 15.7 mg/dL

## 2023-03-30 LAB — COMPREHENSIVE METABOLIC PANEL
ALT: 31 [IU]/L (ref 0–32)
AST: 20 [IU]/L (ref 0–40)
Albumin: 4.1 g/dL (ref 3.8–4.9)
Alkaline Phosphatase: 136 [IU]/L — ABNORMAL HIGH (ref 44–121)
BUN/Creatinine Ratio: 11 — ABNORMAL LOW (ref 12–28)
BUN: 9 mg/dL (ref 8–27)
Bilirubin Total: 0.2 mg/dL (ref 0.0–1.2)
CO2: 22 mmol/L (ref 20–29)
Calcium: 9.9 mg/dL (ref 8.7–10.3)
Chloride: 102 mmol/L (ref 96–106)
Creatinine, Ser: 0.8 mg/dL (ref 0.57–1.00)
Globulin, Total: 3 g/dL (ref 1.5–4.5)
Glucose: 168 mg/dL — ABNORMAL HIGH (ref 70–99)
Potassium: 3.7 mmol/L (ref 3.5–5.2)
Sodium: 140 mmol/L (ref 134–144)
Total Protein: 7.1 g/dL (ref 6.0–8.5)
eGFR: 84 mL/min/{1.73_m2} (ref >59)

## 2023-03-30 LAB — HEMOGLOBIN A1C
Est. average glucose Bld gHb Est-mCnc: 163 mg/dL
Hgb A1c MFr Bld: 7.3 % — ABNORMAL HIGH (ref 4.8–5.6)

## 2023-04-01 ENCOUNTER — Telehealth: Payer: Self-pay

## 2023-04-01 NOTE — Progress Notes (Signed)
 Care Guide Pharmacy Note  04/01/2023 Name: Madison Ritter MRN: [id number] DOB: 1960-04-30  Referred By: Sharma Coyer, MD Reason for referral: Care Coordination (Outreach to schedule with Pharm d )   [name] is a 63 y.o. year old female who is a primary care patient of Simmons-Robinson, [place], MD.  Dedra Zeoli was referred to the pharmacist for assistance related to: DMII  Successful contact was made with the patient to discuss pharmacy services including being ready for the pharmacist to call at least 5 minutes before the scheduled appointment time and to have medication bottles and any blood pressure readings ready for review. The patient agreed to meet with the pharmacist via telephone visit on (date/time).04/16/2023  Jeoffrey Buffalo , RMA     [place]  [facility] Guide  Direct Dial: [phone]  Website: [email or web address]

## 2023-04-08 ENCOUNTER — Other Ambulatory Visit: Payer: Self-pay

## 2023-04-08 NOTE — Patient Outreach (Signed)
  Care Management   Visit Note  04/08/2023 Name: Madison Ritter MRN: [id number] DOB: May 12, 1960  Subjective: Madison Ritter is a 63 y.o. year old female who is a primary care patient of Simmons-Robinson, Tawanna Cooler, MD. The Care Management team was consulted for assistance.      Engaged with Madison Ritter via telephone.  Assessment:  Review of patient past medical history, allergies, medications, health status, including review of consultants reports, laboratory and other test data, was performed as part of  evaluation and provision of care management services.    Outpatient Encounter Medications as of 04/08/2023  Medication Sig   acetaminophen (TYLENOL) 500 MG tablet Take 1,000 mg by mouth every 6 (six) hours as needed (for pain.).   amLODipine (NORVASC) 10 MG tablet TAKE 1 TABLET(10 MG) BY MOUTH DAILY   atorvastatin (LIPITOR) 80 MG tablet TAKE 1 TABLET(80 MG) BY MOUTH DAILY   clobetasol ointment (TEMOVATE) 0.05 % Apply 1 Application topically 2 (two) times daily.   empagliflozin (JARDIANCE) 10 MG TABS tablet Take 1 tablet (10 mg total) by mouth daily before breakfast.   hydrALAZINE (APRESOLINE) 10 MG tablet TAKE 1 TABLET(10 MG) BY MOUTH THREE TIMES DAILY   metFORMIN (GLUCOPHAGE) 500 MG tablet Take 2 tablets (1,000 mg total) by mouth 2 (two) times daily with a meal.   metoprolol succinate (TOPROL-XL) 50 MG 24 hr tablet Take 1 tablet (50 mg total) by mouth daily.   nicotine polacrilex (NICORETTE) 2 MG gum Take 1 each (2 mg total) by mouth as needed for smoking cessation.   pantoprazole (PROTONIX) 40 MG tablet Take 1 tablet (40 mg total) by mouth daily.   polyethylene glycol powder (GLYCOLAX/MIRALAX) 17 GM/SCOOP powder Mix 1 capful in a Drink Once daily for Constipation.   valsartan-hydrochlorothiazide (DIOVAN-HCT) 160-25 MG tablet Take 1 tablet by mouth daily.   Vitamin D, Ergocalciferol, (DRISDOL) 1.25 MG (50000 UNIT) CAPS capsule Take 1 capsule (50,000 Units total) by mouth every 7 (seven) days.    No facility-administered encounter medications on file as of 04/08/2023.

## 2023-04-16 ENCOUNTER — Other Ambulatory Visit: Payer: Self-pay | Admitting: Pharmacist

## 2023-04-16 NOTE — Progress Notes (Signed)
   04/16/2023  Patient ID: Porfirio Mylar Wunder, female   DOB: [birth year], 63 y.o.   MRN: [id number]  Called and spoke with the patient on the phone today. She was referred for assistance with Jardiance cost.  Patient was already given a discount card for Jardiance due to being a Nurse, learning disability patient. [place] Pepper is likely the preferred formulary medication with [employer]. Reports it was not activated properly at the last pick-up, so she decided to pay for the medication instead for about $37. Has activated it and plans to use at next pick-up. Advised if it does NOT work, she can go Therapist, sports and request a new electronic card to use.  Second most expensive medication is the pantoprazole. Reports paying $42 for a 3 month supply. Said she has a procedure and has been taking it since then. Advised if she continues on this medication after her Gastro appointment, she can ask about seeing cost of Omeprazole instead. May be preferred by [facility].  For nicotine gum, reports not starting smoking cessation yet. She has the gum, but has NOT started using it yet. Advised to set a quit date after her GI appointment in February and to commit. Is hoping to start the process prior to her 63st birthday.   No follow-up needed.   Marlowe Aschoff, PharmD [facility] Phone Number: [phone]

## 2023-04-25 NOTE — Progress Notes (Signed)
 Brigitte Canard, PA-C [address]  [address]  [place], [place] [address]  Main: [phone]  Fax: [phone]   Primary Care Physician: Mimi Alt, MD  Primary Gastroenterologist:  Brigitte Canard, PA-C / Dr. Marnee Sink    CC: F/U GERD, Esophagitis, Gastric Ulcer, Constipation  HPI: Madison Ritter is a 63 y.o. female returns for followup of upper abdominal pain, early satiety, decreased appetite, nausea, and vomiting.  She has been taking Protonix  40mg  daily for the past 2-3 months with great benefit.  Upper GI symptoms have resolved.  She has no more upper abdominal pain, heartburn, nausea or vomiting.    History of chronic constipation for many years, improved on MiraLAX  1 TBSP daily. She still has occasional hard stools.  Has BM daily with no straining or rectal bleeding.  Admits to increased bowel sounds, but no abdominal pain. History of hepatic steatosis.  She is trying to loose weight and eat healthier.  02/21/2023 EGD by Dr. Ole Berkeley: LA grade A esophagitis at GE junction.  1 nonbleeding gastric ulcer.  Normal duodenum.  Biopsies negative for H. pylori, metaplasia, or dysplasia.  Currently taking Protonix  40 mg once daily.  She denies any NSAID use.  Only take Tylenol  prn.  -Lab 03/2023: Alk phos 136 (improved), otherwise normal LFTs.  Glucose 168.  A1c 7.3. -Lab 01/2023: Improved normal alk phos 116.  Normal LFTs.  Normal GGT and lipase.  NASH FibroSure showed moderate to severe steatosis with no fibrosis. -Labs 10/2022: Normal hemoglobin 14.3.  Elevated lipase (109), GGT (132), and alk phos (160).  TSH, ANA, AMA, and ASMA were normal / negative.   -Labs 07/2022: normal hemoglobin 15.4.  Alk phos elevated at 177.  All other LFTs normal.  Acute viral hepatitis A, B, and C labs negative.  Mildly elevated lipase at 90.     Abdominal pelvic CT with contrast 10/2022: No acute abnormality.  Normal liver.  No gallstones.  Normal pancreas and intestines.   RUQ ultrasound  07/2022 was normal.  No gallstones.  CBD 3 mm.  Diffuse hepatic steatosis.  Colonoscopy 04/2014 by Dr. Ole Berkeley showed 2 hyperplastic polyps.  10-year repeat.  Current Outpatient Medications  Medication Sig Dispense Refill   acetaminophen  (TYLENOL ) 500 MG tablet Take 1,000 mg by mouth every 6 (six) hours as needed (for pain.).     amLODipine  (NORVASC ) 10 MG tablet TAKE 1 TABLET(10 MG) BY MOUTH DAILY 90 tablet 1   atorvastatin  (LIPITOR) 80 MG tablet TAKE 1 TABLET(80 MG) BY MOUTH DAILY 90 tablet 3   clobetasol  ointment (TEMOVATE ) 0.05 % Apply 1 Application topically 2 (two) times daily. 30 g 0   empagliflozin  (JARDIANCE ) 10 MG TABS tablet Take 1 tablet (10 mg total) by mouth daily before breakfast. 30 tablet 3   hydrALAZINE  (APRESOLINE ) 10 MG tablet TAKE 1 TABLET(10 MG) BY MOUTH THREE TIMES DAILY 270 tablet 0   metFORMIN  (GLUCOPHAGE ) 500 MG tablet Take 2 tablets (1,000 mg total) by mouth 2 (two) times daily with a meal. 360 tablet 1   metoprolol  succinate (TOPROL -XL) 50 MG 24 hr tablet Take 1 tablet (50 mg total) by mouth daily. 90 tablet 1   nicotine  polacrilex (NICORETTE ) 2 MG gum Take 1 each (2 mg total) by mouth as needed for smoking cessation. 100 tablet 3   pantoprazole  (PROTONIX ) 20 MG tablet Take 1 tablet (20 mg total) by mouth daily. 90 tablet 3   polyethylene glycol powder (GLYCOLAX /MIRALAX ) 17 GM/SCOOP powder Mix 1 capful in a Drink Once  daily for Constipation.     valsartan -hydrochlorothiazide  (DIOVAN -HCT) 160-25 MG tablet Take 1 tablet by mouth daily. 90 tablet 3   Vitamin D , Ergocalciferol , (DRISDOL ) 1.25 MG (50000 UNIT) CAPS capsule Take 1 capsule (50,000 Units total) by mouth every 7 (seven) days. 10 capsule 0   No current facility-administered medications for this visit.    Allergies as of 04/28/2023 - Review Complete 04/28/2023  Allergen Reaction Noted   Sulfa antibiotics Hives 01/11/2015    Past Medical History:  Diagnosis Date   Diabetes mellitus without complication (HCC)     Hypertension     Past Surgical History:  Procedure Laterality Date   ABDOMINAL HYSTERECTOMY  2004   BREAST DUCTAL SYSTEM EXCISION Left 04/14/2019   Procedure: EXCISION DUCTAL SYSTEM BREAST - NO SENTINEL NODE;  Surgeon: Flynn Hylan, MD;  Location: [facility];  Service: General;  Laterality: Left;   BREAST EXCISIONAL BIOPSY Left 03/2019   EPIDERMAL INCLUSION CYST.   ESOPHAGOGASTRODUODENOSCOPY (EGD) WITH PROPOFOL  N/A 02/21/2023   Procedure: ESOPHAGOGASTRODUODENOSCOPY (EGD) WITH PROPOFOL ;  Surgeon: Marnee Sink, MD;  Location: [facility];  Service: Endoscopy;  Laterality: N/A;   TUBAL LIGATION      Review of Systems:    All systems reviewed and negative except where noted in HPI.   Physical Examination:   BP (!) 150/90   Pulse 77   Temp 98.5 F (36.9 C)   Ht 5\' 4"  (1.626 m)   Wt 182 lb 6.4 oz (82.7 kg)   BMI 31.31 kg/m   General: Well-nourished, well-developed in no acute distress.  Lungs: Clear to auscultation bilaterally. Non-labored. Heart: Regular rate and rhythm, no murmurs rubs or gallops.  Abdomen: Bowel sounds are normal; Abdomen is Soft; No hepatosplenomegaly, masses or hernias;  No Abdominal Tenderness; No guarding or rebound tenderness. Neuro: Alert and oriented x 3.  Grossly intact.  Psych: Alert and cooperative, normal mood and affect.   Imaging Studies: No results found.  Assessment and Plan:   [name] is a 63 y.o. y/o female returns for f/u of:  1.  GERD with esophagitis (LA grade A)  Continue Protonix  - Decrease to 20 mg once daily, #90, 3 RF.  2.  Gastric ulcer (biopsies negative for H. pylori)  Continue Protonix  - Decrease to 20 mg once daily long term, #90, 3 RF.  Avoid NSAIDs  3.  Moderate hepatic steatosis  Recommend a low-fat diet, regular exercise, and weight loss.  4.  Chronic constipation - Improved  Continue Miralax  daily.  Increase to 2 TBSP daily in a drink.  Adjust dose of Miralax  based on bowel movements.   Brigitte Canard, PA-C  Follow up As Needed if recurrent GI symptoms.

## 2023-04-28 ENCOUNTER — Encounter: Payer: Self-pay | Admitting: Physician Assistant

## 2023-04-28 ENCOUNTER — Ambulatory Visit (INDEPENDENT_AMBULATORY_CARE_PROVIDER_SITE_OTHER): Payer: BC Managed Care – PPO | Admitting: Physician Assistant

## 2023-04-28 VITALS — BP 150/90 | HR 77 | Temp 98.5°F | Ht 64.0 in | Wt 182.4 lb

## 2023-04-28 DIAGNOSIS — K2100 Gastro-esophageal reflux disease with esophagitis, without bleeding: Secondary | ICD-10-CM | POA: Diagnosis not present

## 2023-04-28 DIAGNOSIS — K259 Gastric ulcer, unspecified as acute or chronic, without hemorrhage or perforation: Secondary | ICD-10-CM

## 2023-04-28 DIAGNOSIS — K760 Fatty (change of) liver, not elsewhere classified: Secondary | ICD-10-CM | POA: Diagnosis not present

## 2023-04-28 DIAGNOSIS — K253 Acute gastric ulcer without hemorrhage or perforation: Secondary | ICD-10-CM

## 2023-04-28 DIAGNOSIS — K5909 Other constipation: Secondary | ICD-10-CM | POA: Diagnosis not present

## 2023-04-28 DIAGNOSIS — K5904 Chronic idiopathic constipation: Secondary | ICD-10-CM

## 2023-04-28 MED ORDER — PANTOPRAZOLE SODIUM 20 MG PO TBEC: 20.0000 mg | DELAYED_RELEASE_TABLET | Freq: Every day | ORAL | 3 refills | Status: DC

## 2023-05-21 ENCOUNTER — Other Ambulatory Visit: Payer: Self-pay | Admitting: Gastroenterology

## 2023-05-27 ENCOUNTER — Telehealth: Admitting: Physician Assistant

## 2023-05-27 DIAGNOSIS — J019 Acute sinusitis, unspecified: Secondary | ICD-10-CM

## 2023-05-27 DIAGNOSIS — B9689 Other specified bacterial agents as the cause of diseases classified elsewhere: Secondary | ICD-10-CM | POA: Diagnosis not present

## 2023-05-27 MED ORDER — FLUTICASONE PROPIONATE 50 MCG/ACT NA SUSP: 2.0000 | Freq: Every day | NASAL | 0 refills | Status: AC

## 2023-05-27 MED ORDER — DOXYCYCLINE HYCLATE 100 MG PO TABS: 100.0000 mg | ORAL_TABLET | Freq: Two times a day (BID) | ORAL | 0 refills | Status: DC

## 2023-05-27 NOTE — Patient Instructions (Signed)
 Ayva Geter, thank you for joining Piedad Climes, PA-C for today's virtual visit.  While this provider is not your primary care provider (PCP), if your PCP is located in our provider database this encounter information will be shared with them immediately following your visit.   A [facility] MyChart account gives you access to today's visit and all your visits, tests, and labs performed at [facility] " click here if you don't have a [facility] MyChart account or go to [email or web address]  Consent: (Patient) Elanda Lisa provided verbal consent for this virtual visit at the beginning of the encounter.  Current Medications:  Current Outpatient Medications:    acetaminophen (TYLENOL) 500 MG tablet, Take 1,000 mg by mouth every 6 (six) hours as needed (for pain.)., Disp: , Rfl:    amLODipine (NORVASC) 10 MG tablet, TAKE 1 TABLET(10 MG) BY MOUTH DAILY, Disp: 90 tablet, Rfl: 1   atorvastatin (LIPITOR) 80 MG tablet, TAKE 1 TABLET(80 MG) BY MOUTH DAILY, Disp: 90 tablet, Rfl: 3   clobetasol ointment (TEMOVATE) 0.05 %, Apply 1 Application topically 2 (two) times daily., Disp: 30 g, Rfl: 0   empagliflozin (JARDIANCE) 10 MG TABS tablet, Take 1 tablet (10 mg total) by mouth daily before breakfast., Disp: 30 tablet, Rfl: 3   hydrALAZINE (APRESOLINE) 10 MG tablet, TAKE 1 TABLET(10 MG) BY MOUTH THREE TIMES DAILY, Disp: 270 tablet, Rfl: 0   metFORMIN (GLUCOPHAGE) 500 MG tablet, Take 2 tablets (1,000 mg total) by mouth 2 (two) times daily with a meal., Disp: 360 tablet, Rfl: 1   metoprolol succinate (TOPROL-XL) 50 MG 24 hr tablet, Take 1 tablet (50 mg total) by mouth daily., Disp: 90 tablet, Rfl: 1   nicotine polacrilex (NICORETTE) 2 MG gum, Take 1 each (2 mg total) by mouth as needed for smoking cessation., Disp: 100 tablet, Rfl: 3   pantoprazole (PROTONIX) 20 MG tablet, Take 1 tablet (20 mg total) by mouth daily., Disp: 90 tablet, Rfl: 3   polyethylene glycol powder  (GLYCOLAX/MIRALAX) 17 GM/SCOOP powder, Mix 1 capful in a Drink Once daily for Constipation., Disp: , Rfl:    valsartan-hydrochlorothiazide (DIOVAN-HCT) 160-25 MG tablet, Take 1 tablet by mouth daily., Disp: 90 tablet, Rfl: 3   Vitamin D, Ergocalciferol, (DRISDOL) 1.25 MG (50000 UNIT) CAPS capsule, Take 1 capsule (50,000 Units total) by mouth every 7 (seven) days., Disp: 10 capsule, Rfl: 0   Medications ordered in this encounter:  No orders of the defined types were placed in this encounter.    *If you need refills on other medications prior to your next appointment, please contact your pharmacy*  Follow-Up: Call back or seek an in-person evaluation if the symptoms worsen or if the condition fails to improve as anticipated.  [facility] [phone]  Other Instructions Please take antibiotic as directed.  Increase fluid intake.  Use Saline nasal spray.  Take a daily multivitamin. Ok to continue the [employer]. Start the [facility] as directed.  Place a humidifier in the bedroom.  Please call or return clinic if symptoms are not improving.  Sinusitis Sinusitis is redness, soreness, and swelling (inflammation) of the paranasal sinuses. Paranasal sinuses are air pockets within the bones of your face (beneath the eyes, the middle of the forehead, or above the eyes). In healthy paranasal sinuses, mucus is able to drain out, and air is able to circulate through them by way of your nose. However, when your paranasal sinuses are inflamed, mucus and air can become trapped. This can allow  bacteria and other germs to grow and cause infection. Sinusitis can develop quickly and last only a short time (acute) or continue over a long period (chronic). Sinusitis that lasts for more than 12 weeks is considered chronic.  CAUSES  Causes of sinusitis include: Allergies. Structural abnormalities, such as displacement of the cartilage that separates your nostrils (deviated septum), which can decrease the  air flow through your nose and sinuses and affect sinus drainage. Functional abnormalities, such as when the small hairs (cilia) that line your sinuses and help remove mucus do not work properly or are not present. SYMPTOMS  Symptoms of acute and chronic sinusitis are the same. The primary symptoms are pain and pressure around the affected sinuses. Other symptoms include: Upper toothache. Earache. Headache. Bad breath. Decreased sense of smell and taste. A cough, which worsens when you are lying flat. Fatigue. Fever. Thick drainage from your nose, which often is green and may contain pus (purulent). Swelling and warmth over the affected sinuses. DIAGNOSIS  Your caregiver will perform a physical exam. During the exam, your caregiver may: Look in your nose for signs of abnormal growths in your nostrils (nasal polyps). Tap over the affected sinus to check for signs of infection. View the inside of your sinuses (endoscopy) with a special imaging device with a light attached (endoscope), which is inserted into your sinuses. If your caregiver suspects that you have chronic sinusitis, one or more of the following tests may be recommended: Allergy tests. Nasal culture A sample of mucus is taken from your nose and sent to a lab and screened for bacteria. Nasal cytology A sample of mucus is taken from your nose and examined by your caregiver to determine if your sinusitis is related to an allergy. TREATMENT  Most cases of acute sinusitis are related to a viral infection and will resolve on their own within 10 days. Sometimes medicines are prescribed to help relieve symptoms (pain medicine, decongestants, nasal steroid sprays, or saline sprays).  However, for sinusitis related to a bacterial infection, your caregiver will prescribe antibiotic medicines. These are medicines that will help kill the bacteria causing the infection.  Rarely, sinusitis is caused by a fungal infection. In theses cases, your  caregiver will prescribe antifungal medicine. For some cases of chronic sinusitis, surgery is needed. Generally, these are cases in which sinusitis recurs more than 3 times per year, despite other treatments. HOME CARE INSTRUCTIONS  Drink plenty of water. Water helps thin the mucus so your sinuses can drain more easily. Use a humidifier. Inhale steam 3 to 4 times a day (for example, sit in the bathroom with the shower running). Apply a warm, moist washcloth to your face 3 to 4 times a day, or as directed by your caregiver. Use saline nasal sprays to help moisten and clean your sinuses. Take over-the-counter or prescription medicines for pain, discomfort, or fever only as directed by your caregiver. SEEK IMMEDIATE MEDICAL CARE IF: You have increasing pain or severe headaches. You have nausea, vomiting, or drowsiness. You have swelling around your face. You have vision problems. You have a stiff neck. You have difficulty breathing. MAKE SURE YOU:  Understand these instructions. Will watch your condition. Will get help right away if you are not doing well or get worse. Document Released: 03/04/2005 Document Revised: 05/27/2011 Document Reviewed: 03/19/2011 [facility] Patient Information 2014 [place], [place].    If you have been instructed to have an in-person evaluation today at a local Urgent Care facility, please use the link  below. It will take you to a list of all of our available [facility], including address, phone number and hours of operation. Please do not delay care.  [facility]  If you or a family member do not have a primary care provider, use the link below to schedule a visit and establish care. When you choose a [facility] primary care physician or advanced practice provider, you gain a long-term partner in health. Find a Primary Care Provider  Learn more about [facility]'s in-office and virtual care options: [facility] - Get Care Now

## 2023-05-27 NOTE — Progress Notes (Signed)
 Virtual Visit Consent   Madison Ritter, you are scheduled for a virtual visit with a [facility] provider today. Just as with appointments in the office, your consent must be obtained to participate. Your consent will be active for this visit and any virtual visit you may have with one of our providers in the next 365 days. If you have a MyChart account, a copy of this consent can be sent to you electronically.  As this is a virtual visit, video technology does not allow for your provider to perform a traditional examination. This may limit your provider's ability to fully assess your condition. If your provider identifies any concerns that need to be evaluated in person or the need to arrange testing (such as labs, EKG, etc.), we will make arrangements to do so. Although advances in technology are sophisticated, we cannot ensure that it will always work on either your end or our end. If the connection with a video visit is poor, the visit may have to be switched to a telephone visit. With either a video or telephone visit, we are not always able to ensure that we have a secure connection.  By engaging in this virtual visit, you consent to the provision of healthcare and authorize for your insurance to be billed (if applicable) for the services provided during this visit. Depending on your insurance coverage, you may receive a charge related to this service.  I need to obtain your verbal consent now. Are you willing to proceed with your visit today? Madison Ritter has provided verbal consent on 05/27/2023 for a virtual visit (video or telephone). Piedad Climes, [place]  Date: 05/27/2023 3:14 PM   Virtual Visit via Video Note   I, Piedad Climes, connected with  Madison Ritter  ([id number], [birth year]) on 05/27/23 at  3:15 PM EDT by a video-enabled telemedicine application and verified that I am speaking with the correct person using two identifiers.  Location: Patient: Virtual Visit Location  Patient: Home Provider: Virtual Visit Location Provider: Home Office   I discussed the limitations of evaluation and management by telemedicine and the availability of in person appointments. The patient expressed understanding and agreed to proceed.    History of Present Illness: Madison Ritter is a 63 y.o. who identifies as a female who was assigned female at birth, and is being seen today for URI symptoms over the past week. Initially with aches, chills, cold without fever, feeling quite run down. By a few days later she had taken Alka Seltzer plus with improvement in symptoms. Symptoms had not fully resolved but much better. Notes in past 48 hours with recurrence of severe symptoms along with sinus pressure and sinus pain. Noted R maxillary/facial pain and R ear pain.   HPI: HPI  Problems:  Patient Active Problem List   Diagnosis Date Noted   Nausea and vomiting 09/12/2022   RUQ abdominal pain 08/07/2022   Current non-adherence to medical treatment 06/09/2022   Severe episode of recurrent major depressive disorder, without psychotic features (HCC) 06/07/2022   Encounter for annual health examination 12/06/2021   Hyperlipidemia associated with type 2 diabetes mellitus (HCC) 12/06/2021   Hypertension associated with diabetes (HCC) 07/05/2021   Stress and adjustment reaction 01/15/2021   Type 2 diabetes mellitus without complication, without long-term current use of insulin (HCC) 01/15/2021   Psychophysiological insomnia 01/15/2021   Gynecomastia 01/15/2021   Obese 01/11/2015   Vitamin D deficiency, unspecified 01/11/2015   Current tobacco use 02/13/2009    Allergies:  Allergies  Allergen Reactions   Sulfa Antibiotics Hives   Medications:  Current Outpatient Medications:    doxycycline (VIBRA-TABS) 100 MG tablet, Take 1 tablet (100 mg total) by mouth 2 (two) times daily., Disp: 20 tablet, Rfl: 0   fluticasone (FLONASE) 50 MCG/ACT nasal spray, Place 2 sprays into both nostrils  daily., Disp: 16 g, Rfl: 0   acetaminophen (TYLENOL) 500 MG tablet, Take 1,000 mg by mouth every 6 (six) hours as needed (for pain.)., Disp: , Rfl:    amLODipine (NORVASC) 10 MG tablet, TAKE 1 TABLET(10 MG) BY MOUTH DAILY, Disp: 90 tablet, Rfl: 1   atorvastatin (LIPITOR) 80 MG tablet, TAKE 1 TABLET(80 MG) BY MOUTH DAILY, Disp: 90 tablet, Rfl: 3   clobetasol ointment (TEMOVATE) 0.05 %, Apply 1 Application topically 2 (two) times daily., Disp: 30 g, Rfl: 0   empagliflozin (JARDIANCE) 10 MG TABS tablet, Take 1 tablet (10 mg total) by mouth daily before breakfast., Disp: 30 tablet, Rfl: 3   hydrALAZINE (APRESOLINE) 10 MG tablet, TAKE 1 TABLET(10 MG) BY MOUTH THREE TIMES DAILY, Disp: 270 tablet, Rfl: 0   metFORMIN (GLUCOPHAGE) 500 MG tablet, Take 2 tablets (1,000 mg total) by mouth 2 (two) times daily with a meal., Disp: 360 tablet, Rfl: 1   metoprolol succinate (TOPROL-XL) 50 MG 24 hr tablet, Take 1 tablet (50 mg total) by mouth daily., Disp: 90 tablet, Rfl: 1   nicotine polacrilex (NICORETTE) 2 MG gum, Take 1 each (2 mg total) by mouth as needed for smoking cessation., Disp: 100 tablet, Rfl: 3   pantoprazole (PROTONIX) 20 MG tablet, Take 1 tablet (20 mg total) by mouth daily., Disp: 90 tablet, Rfl: 3   polyethylene glycol powder (GLYCOLAX/MIRALAX) 17 GM/SCOOP powder, Mix 1 capful in a Drink Once daily for Constipation., Disp: , Rfl:    valsartan-hydrochlorothiazide (DIOVAN-HCT) 160-25 MG tablet, Take 1 tablet by mouth daily., Disp: 90 tablet, Rfl: 3   Vitamin D, Ergocalciferol, (DRISDOL) 1.25 MG (50000 UNIT) CAPS capsule, Take 1 capsule (50,000 Units total) by mouth every 7 (seven) days., Disp: 10 capsule, Rfl: 0  Observations/Objective: Patient is well-developed, well-nourished in no acute distress.  Resting comfortably at home.  Head is normocephalic, atraumatic.  No labored breathing. Speech is clear and coherent with logical content.  Patient is alert and oriented at baseline.   Assessment  and Plan: 1. Acute bacterial sinusitis (Primary) - doxycycline (VIBRA-TABS) 100 MG tablet; Take 1 tablet (100 mg total) by mouth 2 (two) times daily.  Dispense: 20 tablet; Refill: 0 - fluticasone (FLONASE) 50 MCG/ACT nasal spray; Place 2 sprays into both nostrils daily.  Dispense: 16 g; Refill: 0  Rx Doxycycline.  Increase fluids.  Rest.  Saline nasal spray.  Probiotic.  Mucinex as directed.  Humidifier in bedroom. Flonase per orders.  Call or return to clinic if symptoms are not improving.   Follow Up Instructions: I discussed the assessment and treatment plan with the patient. The patient was provided an opportunity to ask questions and all were answered. The patient agreed with the plan and demonstrated an understanding of the instructions.  A copy of instructions were sent to the patient via MyChart unless otherwise noted below.   The patient was advised to call back or seek an in-person evaluation if the symptoms worsen or if the condition fails to improve as anticipated.    Piedad Climes, PA-C

## 2023-06-18 ENCOUNTER — Telehealth: Payer: Self-pay | Admitting: Family Medicine

## 2023-06-18 DIAGNOSIS — I10 Essential (primary) hypertension: Secondary | ICD-10-CM

## 2023-06-18 NOTE — Telephone Encounter (Signed)
 [facility] pharmacy is requesting refill DIOVAN HCT 160MG /25MG  tablets Drug Change request Please advise

## 2023-06-19 MED ORDER — AMLODIPINE BESYLATE 10 MG PO TABS: ORAL_TABLET | ORAL | 1 refills | Status: DC

## 2023-06-19 NOTE — Telephone Encounter (Signed)
 Rx was sent in Oct 2024 with 3 refills for 90 day supply. She should still have enough refills to last 6 months.

## 2023-06-19 NOTE — Addendum Note (Signed)
 Addended by: Marjie Skiff on: 06/19/2023 02:28 PM   Modules accepted: Orders

## 2023-06-19 NOTE — Telephone Encounter (Signed)
 Patient reports that she needs Amlodipine.

## 2023-06-23 ENCOUNTER — Other Ambulatory Visit: Payer: Self-pay | Admitting: Family Medicine

## 2023-06-23 DIAGNOSIS — I10 Essential (primary) hypertension: Secondary | ICD-10-CM

## 2023-06-26 ENCOUNTER — Encounter: Payer: Self-pay | Admitting: Family Medicine

## 2023-06-26 ENCOUNTER — Ambulatory Visit: Payer: Self-pay | Admitting: Family Medicine

## 2023-06-26 VITALS — BP 138/92 | HR 87 | Ht 64.0 in | Wt 179.0 lb

## 2023-06-26 DIAGNOSIS — E119 Type 2 diabetes mellitus without complications: Secondary | ICD-10-CM

## 2023-06-26 DIAGNOSIS — E1159 Type 2 diabetes mellitus with other circulatory complications: Secondary | ICD-10-CM | POA: Diagnosis not present

## 2023-06-26 DIAGNOSIS — Z7984 Long term (current) use of oral hypoglycemic drugs: Secondary | ICD-10-CM

## 2023-06-26 DIAGNOSIS — I152 Hypertension secondary to endocrine disorders: Secondary | ICD-10-CM

## 2023-06-26 NOTE — Progress Notes (Unsigned)
 Established patient visit   Patient: Madison Ritter   DOB: 02-17-1961   63 y.o. Female  MRN: [id number] Visit Date: 06/26/2023  Today's healthcare provider: Mimi Alt, MD   Chief Complaint  Patient presents with   Diabetes    No concerns    Subjective     HPI     Diabetes    Additional comments: No concerns       Last edited by Bart Lieu, CMA on 06/26/2023  4:12 PM.       Discussed the use of AI scribe software for clinical note transcription with the patient, who gave verbal consent to proceed.  History of Present Illness [name] is a 63 year old female with diabetes, hypertension, and hyperlipidemia who presents for a follow-up on diabetes management.  Her last hemoglobin A1c was 7.3% in January 2025. Since starting Jardiance, she experiences gastrointestinal symptoms, including a feeling of needing to eat immediately after taking the medication, but then not being able to eat much for the rest of the day. She also mentions a possible yeast infection, having been on doxycycline for a viral infection recently, and suspects it may be related to [place].  She has a history of hypertension and is currently on amlodipine 10 mg daily, hydralazine 10 mg three times daily, metoprolol 50 mg daily, and valsartan 160 mg with hydrochlorothiazide 25 mg daily. Her blood pressure was elevated today, which she attributes to stress related to her mother's hospitalization and missing her medication yesterday.  For hyperlipidemia, she continues to take Lipitor 80 mg daily. Her last lipid panel was in 2024, and she acknowledges the need for an updated panel.  She reports feeling tired and lacking energy, stating 'I don't want to come outside. I don't want to do anything.' She attributes some of this to her current life stressors, including her mother's hospitalization.  She mentions a history of stomach issues, which have improved, but she still experiences  loud stomach noises, which she finds embarrassing. She is regular with bowel movements and no longer experiences nausea or vomiting.     Past Medical History:  Diagnosis Date   Diabetes mellitus without complication (HCC)    Hypertension     Medications: Outpatient Medications Prior to Visit  Medication Sig   acetaminophen (TYLENOL) 500 MG tablet Take 1,000 mg by mouth every 6 (six) hours as needed (for pain.).   amLODipine (NORVASC) 10 MG tablet TAKE 1 TABLET(10 MG) BY MOUTH DAILY   atorvastatin (LIPITOR) 80 MG tablet TAKE 1 TABLET(80 MG) BY MOUTH DAILY   clobetasol ointment (TEMOVATE) 0.05 % Apply 1 Application topically 2 (two) times daily.   doxycycline (VIBRA-TABS) 100 MG tablet Take 1 tablet (100 mg total) by mouth 2 (two) times daily.   fluticasone (FLONASE) 50 MCG/ACT nasal spray Place 2 sprays into both nostrils daily.   hydrALAZINE (APRESOLINE) 10 MG tablet TAKE 1 TABLET(10 MG) BY MOUTH THREE TIMES DAILY   metFORMIN (GLUCOPHAGE) 500 MG tablet Take 2 tablets (1,000 mg total) by mouth 2 (two) times daily with a meal.   metoprolol succinate (TOPROL-XL) 50 MG 24 hr tablet Take 1 tablet (50 mg total) by mouth daily.   nicotine polacrilex (NICORETTE) 2 MG gum Take 1 each (2 mg total) by mouth as needed for smoking cessation.   pantoprazole (PROTONIX) 20 MG tablet Take 1 tablet (20 mg total) by mouth daily.   polyethylene glycol powder (GLYCOLAX/MIRALAX) 17 GM/SCOOP powder Mix 1 capful in a  Drink Once daily for Constipation.   valsartan-hydrochlorothiazide (DIOVAN-HCT) 160-25 MG tablet Take 1 tablet by mouth daily.   Vitamin D, Ergocalciferol, (DRISDOL) 1.25 MG (50000 UNIT) CAPS capsule Take 1 capsule (50,000 Units total) by mouth every 7 (seven) days.   [DISCONTINUED] empagliflozin (JARDIANCE) 10 MG TABS tablet Take 1 tablet (10 mg total) by mouth daily before breakfast.   No facility-administered medications prior to visit.    Review of Systems  Last metabolic panel Lab  Results  Component Value Date   GLUCOSE 168 (H) 03/28/2023   NA 140 03/28/2023   K 3.7 03/28/2023   CL 102 03/28/2023   CO2 22 03/28/2023   BUN 9 03/28/2023   CREATININE 0.80 03/28/2023   EGFR 84 03/28/2023   CALCIUM 9.9 03/28/2023   PROT 7.1 03/28/2023   ALBUMIN 4.1 03/28/2023   LABGLOB 3.0 03/28/2023   AGRATIO 1.4 06/07/2022   BILITOT 0.2 03/28/2023   ALKPHOS 136 (H) 03/28/2023   AST 20 03/28/2023   ALT 31 03/28/2023   ANIONGAP 10 04/12/2019   Last lipids Lab Results  Component Value Date   CHOL 128 01/23/2023   HDL 69 06/07/2022   LDLCALC 112 (H) 06/07/2022   TRIG 198 (H) 01/23/2023   CHOLHDL 3.0 06/07/2022  The ASCVD Risk score (Arnett DK, et al., 2019) failed to calculate for the following reasons:   The valid total cholesterol range is 130 to 320 mg/dL   Last hemoglobin Q0H Lab Results  Component Value Date   HGBA1C 7.3 (H) 03/28/2023     {See past labs  Heme  Chem  Endocrine  Serology  Results Review (optional):1}   Objective    BP (!) 138/92   Pulse 87   Ht 5\' 4"  (1.626 m)   Wt 179 lb (81.2 kg)   SpO2 99%   BMI 30.73 kg/m  BP Readings from Last 3 Encounters:  06/26/23 (!) 138/92  04/28/23 (!) 150/90  03/28/23 128/74   Wt Readings from Last 3 Encounters:  06/26/23 179 lb (81.2 kg)  04/28/23 182 lb 6.4 oz (82.7 kg)  03/28/23 183 lb 8 oz (83.2 kg)    {See vitals history (optional):1}    Physical Exam Vitals reviewed.  Constitutional:      General: She is not in acute distress.    Appearance: Normal appearance. She is not ill-appearing, toxic-appearing or diaphoretic.  Eyes:     Conjunctiva/sclera: Conjunctivae normal.  Cardiovascular:     Rate and Rhythm: Normal rate and regular rhythm.     Pulses: Normal pulses.     Heart sounds: Normal heart sounds. No murmur heard.    No friction rub. No gallop.  Pulmonary:     Effort: Pulmonary effort is normal. No respiratory distress.     Breath sounds: Normal breath sounds. No stridor. No  wheezing, rhonchi or rales.  Abdominal:     General: Bowel sounds are normal. There is no distension.     Palpations: Abdomen is soft.     Tenderness: There is no abdominal tenderness.  Musculoskeletal:     Right lower leg: No edema.     Left lower leg: No edema.  Skin:    Findings: No erythema or rash.  Neurological:     Mental Status: She is alert and oriented to person, place, and time.  Psychiatric:        Mood and Affect: Mood and affect normal.        Speech: Speech normal.  Behavior: Behavior normal. Behavior is cooperative.       No results found for any visits on 06/26/23.  Assessment & Plan     Problem List Items Addressed This Visit       Cardiovascular and Mediastinum   Hypertension associated with diabetes (HCC)     Endocrine   Type 2 diabetes mellitus without complication, without long-term current use of insulin (HCC) - Primary   Relevant Orders   POCT HgB A1C    Assessment & Plan Diabetes Mellitus Type 2 Chronic  Diabetes management is ongoing. Last hemoglobin A1c was 7.3% in January 2025. Currently on Jardiance, but experiencing gastrointestinal discomfort and possible infection symptoms, likely related to the medication. Discussed potential side effects of Jardiance, including yeast infections and UTIs. Expressed dissatisfaction with Jardiance compared to Farxiga. Considered switching to Rybelsus, a daily oral medication, as an alternative to [place]. Discussed potential benefits of Ozempic or Mounjaro for A1c reduction and weight loss, but noted insurance coverage issues. - Pause Jardiance due to concern for symptoms of vaginal yeast infection  - Start Rybelsus 3 mg daily, increase to 7 mg next month if tolerated - Order hemoglobin A1c test - Consider Ozempic or Mounjaro if insurance allows  Hypertension Chronic, not at goal today  Hypertension is managed with multiple antihypertensive medications. Blood pressure was elevated during the visit,  likely due to stress related to her mother's hospitalization. Emphasized the importance of medication adherence, which she admitted to forgetting recently. - Continue amlodipine 10 mg daily - Continue hydralazine 10 mg three times daily - Continue metoprolol 50 mg daily - Continue valsartan 160 mg and hydrochlorothiazide 25 mg daily  Hyperlipidemia Chronic Hyperlipidemia is associated with diabetes and hypertension. Last lipid panel was from 2024, and an updated panel is recommended to assess current cholesterol levels. - Continue Lipitor 80 mg daily - Order lipid panel  General Health Maintenance Requires updated laboratory tests to monitor chronic conditions. - Order lipid panel - Order hemoglobin A1c test  Follow-up Managing multiple chronic conditions and requires regular follow-up to monitor response to medication changes and overall health status. - Schedule follow-up appointment next month, virtual option available     No follow-ups on file.       Mimi Alt, MD  [facility] [phone] (phone) [phone] (fax)  [facility]

## 2023-06-30 LAB — POCT GLYCOSYLATED HEMOGLOBIN (HGB A1C): Hemoglobin A1C: 6.7 % — AB (ref 4.0–5.6)

## 2023-07-21 ENCOUNTER — Telehealth: Payer: Self-pay | Admitting: Family Medicine

## 2023-07-21 DIAGNOSIS — E119 Type 2 diabetes mellitus without complications: Secondary | ICD-10-CM

## 2023-07-21 NOTE — Telephone Encounter (Signed)
 Copied from CRM [id number]. Topic: Clinical - Prescription Issue >> Jul 21, 2023 10:40 AM Jayson Michael wrote: Reason for CRM: Patient stated she was given a trial of Rybelsus at her last office visit on 06/26/2023. She has 5-6 tablets remaining and will run out before her next scheduled appointment. She is requesting a prescription be called in prior to that visit, or a bridge refill until she can be seen. Patient is also willing to come in and pick up more samples if available. Per visit notes: 'Start Rybelsus 3 mg daily, increase to 7 mg next month (07/26/2023) if tolerated.    Preferred pharmacy:   [id number] [place], [place] - [address] AT [facility] [address] [facility] [address] [place], [place] [address] Phone: [phone]  Fax: [phone]      Patient callback [phone]

## 2023-07-22 MED ORDER — RYBELSUS 7 MG PO TABS: 7.0000 mg | ORAL_TABLET | Freq: Every day | ORAL | 3 refills | Status: DC

## 2023-07-22 NOTE — Telephone Encounter (Signed)
Rybelsus 7 mg sent to pharmacy.

## 2023-07-22 NOTE — Addendum Note (Signed)
 Addended by: SIMMONS-ROBINSON, Pauline Trainer L on: 07/22/2023 03:35 PM   Modules accepted: Orders

## 2023-08-06 ENCOUNTER — Encounter: Payer: Self-pay | Admitting: Family Medicine

## 2023-08-06 ENCOUNTER — Ambulatory Visit: Admitting: Family Medicine

## 2023-08-06 VITALS — BP 114/81 | HR 77 | Ht 64.0 in | Wt 174.0 lb

## 2023-08-06 DIAGNOSIS — E1169 Type 2 diabetes mellitus with other specified complication: Secondary | ICD-10-CM | POA: Diagnosis not present

## 2023-08-06 DIAGNOSIS — I152 Hypertension secondary to endocrine disorders: Secondary | ICD-10-CM

## 2023-08-06 DIAGNOSIS — L80 Vitiligo: Secondary | ICD-10-CM | POA: Diagnosis not present

## 2023-08-06 DIAGNOSIS — E559 Vitamin D deficiency, unspecified: Secondary | ICD-10-CM | POA: Diagnosis not present

## 2023-08-06 DIAGNOSIS — E785 Hyperlipidemia, unspecified: Secondary | ICD-10-CM

## 2023-08-06 DIAGNOSIS — E119 Type 2 diabetes mellitus without complications: Secondary | ICD-10-CM

## 2023-08-06 DIAGNOSIS — Z7984 Long term (current) use of oral hypoglycemic drugs: Secondary | ICD-10-CM

## 2023-08-06 DIAGNOSIS — Z0000 Encounter for general adult medical examination without abnormal findings: Secondary | ICD-10-CM | POA: Insufficient documentation

## 2023-08-06 DIAGNOSIS — E1159 Type 2 diabetes mellitus with other circulatory complications: Secondary | ICD-10-CM

## 2023-08-06 NOTE — Patient Instructions (Signed)
  VISIT SUMMARY: During your follow-up visit, we discussed your diabetes management, hypertension, and vitiligo. We reviewed your current medications and addressed your recent symptoms, including appetite suppression and gastrointestinal discomfort. We also talked about your general health maintenance, including vaccinations and vitamin supplementation.  YOUR PLAN: -TYPE 2 DIABETES MELLITUS WITH HYPERLIPIDEMIA: Type 2 diabetes is a condition where your body does not use insulin properly, leading to high blood sugar levels. Your diabetes is well controlled with an A1c of 6.7%. We discussed that semaglutide  might be causing your appetite suppression and gastrointestinal symptoms. We will hold semaglutide  for one month to see if your symptoms improve and then reassess the dosage. Continue taking metformin  1000 mg twice daily and atorvastatin  80 mg daily. We will monitor your A1c again in October 2025.  -HYPERTENSION: Hypertension is high blood pressure. Your blood pressure is well controlled with your current medications. Continue taking hydralazine  10 mg three times daily, amlodipine , and valsartan  160 mg with hydrochlorothiazide  25 mg daily.  INSTRUCTIONS: Hold semaglutide  (Rybelsus ) for one month to assess its impact on your appetite and gastrointestinal symptoms. We will reassess the dosage after one month and consider resuming at 3.5 mg if your symptoms improve. Monitor your A1c in October 2025.                      Contains text generated by Abridge.                                 Contains text generated by Abridge.

## 2023-08-06 NOTE — Progress Notes (Signed)
 Established patient visit   Patient: Madison Ritter   DOB: 01/01/1961   63 y.o. Female  MRN: [id number] Visit Date: 08/06/2023  Today's healthcare provider: Mimi Alt, MD   Chief Complaint  Patient presents with   Diabetes   Subjective       Discussed the use of AI scribe software for clinical note transcription with the patient, who gave verbal consent to proceed.  History of Present Illness Madison Ritter is a 63 year old female with diabetes who presents for a follow-up visit regarding her diabetes management.  Her diabetes management includes metformin  1000 mg twice daily and semaglutide  7 mg daily. Since starting semaglutide , she has experienced a significant decrease in appetite, leading to a weight loss of approximately 5 pounds since February. She describes difficulty eating, stating 'I can order food and when she sits it in front of me, it's like, oh, God, what am I going to do with that? I don't want it.' She also experiences occasional nausea, vomiting, and constipation, for which she uses Miralax .  Her blood pressure is well controlled with hydralazine  10 mg three times daily, amlodipine , and valsartan  160 mg with hydrochlorothiazide  25 mg. She feels fatigued and lacks energy, which she attributes to her current phase in life. She has not been taking her vitamin D  supplement regularly, which is a high-dose pill taken once a week.  She manages hyperlipidemia with atorvastatin  80 mg daily. She mentions a past experience with [place] gummies, which she stopped after noticing an increase in her A1c.  She has a history of vitiligo and uses Opzelura cream as needed, although she is cautious about its use due to potential ingestion risks. Her mouth stays dry, which she attributes to vitiligo.  In terms of social history, she primarily drinks water and does not consume sodas or alcohol regularly. She recently returned from a trip where she experienced poor food  quality, contributing to her decreased intake.     Past Medical History:  Diagnosis Date   Diabetes mellitus without complication (HCC)    Hypertension     Medications: Outpatient Medications Prior to Visit  Medication Sig   acetaminophen  (TYLENOL ) 500 MG tablet Take 1,000 mg by mouth every 6 (six) hours as needed (for pain.).   amLODipine  (NORVASC ) 10 MG tablet TAKE 1 TABLET(10 MG) BY MOUTH DAILY   atorvastatin  (LIPITOR) 80 MG tablet TAKE 1 TABLET(80 MG) BY MOUTH DAILY   clobetasol  ointment (TEMOVATE ) 0.05 % Apply 1 Application topically 2 (two) times daily.   doxycycline  (VIBRA -TABS) 100 MG tablet Take 1 tablet (100 mg total) by mouth 2 (two) times daily.   fluticasone  (FLONASE ) 50 MCG/ACT nasal spray Place 2 sprays into both nostrils daily.   hydrALAZINE  (APRESOLINE ) 10 MG tablet TAKE 1 TABLET(10 MG) BY MOUTH THREE TIMES DAILY   metFORMIN  (GLUCOPHAGE ) 500 MG tablet Take 2 tablets (1,000 mg total) by mouth 2 (two) times daily with a meal.   metoprolol  succinate (TOPROL -XL) 50 MG 24 hr tablet Take 1 tablet (50 mg total) by mouth daily.   nicotine  polacrilex (NICORETTE ) 2 MG gum Take 1 each (2 mg total) by mouth as needed for smoking cessation.   pantoprazole  (PROTONIX ) 20 MG tablet Take 1 tablet (20 mg total) by mouth daily.   polyethylene glycol powder (GLYCOLAX /MIRALAX ) 17 GM/SCOOP powder Mix 1 capful in a Drink Once daily for Constipation.   Semaglutide  (RYBELSUS ) 7 MG TABS Take 1 tablet (7 mg total) by mouth daily.   valsartan -hydrochlorothiazide  (  DIOVAN -HCT) 160-25 MG tablet Take 1 tablet by mouth daily.   Vitamin D , Ergocalciferol , (DRISDOL ) 1.25 MG (50000 UNIT) CAPS capsule Take 1 capsule (50,000 Units total) by mouth every 7 (seven) days.   No facility-administered medications prior to visit.    Review of Systems  Last metabolic panel Lab Results  Component Value Date   GLUCOSE 168 (H) 03/28/2023   NA 140 03/28/2023   K 3.7 03/28/2023   CL 102 03/28/2023   CO2 22  03/28/2023   BUN 9 03/28/2023   CREATININE 0.80 03/28/2023   EGFR 84 03/28/2023   CALCIUM  9.9 03/28/2023   PROT 7.1 03/28/2023   ALBUMIN 4.1 03/28/2023   LABGLOB 3.0 03/28/2023   AGRATIO 1.4 06/07/2022   BILITOT 0.2 03/28/2023   ALKPHOS 136 (H) 03/28/2023   AST 20 03/28/2023   ALT 31 03/28/2023   ANIONGAP 10 04/12/2019   Last lipids Lab Results  Component Value Date   CHOL 128 01/23/2023   HDL 69 06/07/2022   LDLCALC 112 (H) 06/07/2022   TRIG 198 (H) 01/23/2023   CHOLHDL 3.0 06/07/2022   Last hemoglobin A1c Lab Results  Component Value Date   HGBA1C 6.7 (A) 06/30/2023        Objective    BP 114/81   Pulse 77   Ht 5\' 4"  (1.626 m)   Wt 174 lb (78.9 kg)   SpO2 100%   BMI 29.87 kg/m  BP Readings from Last 3 Encounters:  08/06/23 114/81  06/26/23 (!) 138/92  04/28/23 (!) 150/90   Wt Readings from Last 3 Encounters:  08/06/23 174 lb (78.9 kg)  06/26/23 179 lb (81.2 kg)  04/28/23 182 lb 6.4 oz (82.7 kg)        Physical Exam  General: Alert, no acute distress Cardio: Normal S1 and S2, RRR, no r/m/g Pulm: CTAB, normal work of breathing ABD: soft, abdomen is not distended, there is no tenderness to palpation, normal BS  Extremities: no LE edema    No results found for any visits on 08/06/23.  Assessment & Plan     Problem List Items Addressed This Visit       Cardiovascular and Mediastinum   Hypertension associated with diabetes (HCC) - Primary   Hypertension, Chronic  Hypertension is well controlled on current medication regimen. - Continue hydralazine  10 mg three times daily. - Continue amlodipine . - Continue valsartan  160 mg with hydrochlorothiazide  25 mg daily.        Endocrine   Type 2 diabetes mellitus without complication, without long-term current use of insulin (HCC)   Type 2 diabetes mellitus with hyperlipidemia Type 2 diabetes mellitus is well controlled with an A1c of 6.7% as of April 2025. She is experiencing appetite suppression  and gastrointestinal discomfort, potentially related to semaglutide . Weight loss of approximately 5 pounds since February noted. Hyperlipidemia is managed with atorvastatin . Discussed the potential for semaglutide  to curb appetite and cause gastrointestinal symptoms, which may improve over time. Consideration given to adjusting the semaglutide  dosage to manage side effects while maintaining glycemic control. - Continue metformin  1000 mg twice daily. - Continue atorvastatin  80 mg daily. - Hold semaglutide  (Rybelsus ) for one month to assess impact on appetite and gastrointestinal symptoms. - Reassess semaglutide  dosage after one month; consider resuming at 3.5 mg if symptoms improve. - recheck A1c in 3months      Hyperlipidemia associated with type 2 diabetes mellitus (HCC)     Musculoskeletal and Integument   Vitiligo   Vitiligo, Chronic  Vitiligo is  present with noted dryness around the mouth. She is not consistently using Opzelura due to concerns about ingestion and potential side effects. - continue Opzelura as prescribed by dermatology         Other   Vitamin D  deficiency, unspecified   Healthcare maintenance    General Health Maintenance She requires updates on vaccinations and is considering vitamin supplementation. Discussed the impact of gummy vitamins on blood sugar levels and recommended pill form vitamins to avoid added sugars. - Recommend pneumococcal vaccine. - recommend Shingrix vaccine. - Recommend a daily multivitamin appropriate for age, such as Centrum or One A Day 50+. - Ensure adherence to weekly high-dose vitamin D  supplementation.        Assessment & Plan     Return in about 3 months (around 11/06/2023) for DM&labs.         Mimi Alt, MD  [facility] [phone] (phone) [phone] (fax)  [facility]

## 2023-08-06 NOTE — Assessment & Plan Note (Signed)
 Type 2 diabetes mellitus with hyperlipidemia Type 2 diabetes mellitus is well controlled with an A1c of 6.7% as of April 2025. She is experiencing appetite suppression and gastrointestinal discomfort, potentially related to semaglutide . Weight loss of approximately 5 pounds since February noted. Hyperlipidemia is managed with atorvastatin . Discussed the potential for semaglutide  to curb appetite and cause gastrointestinal symptoms, which may improve over time. Consideration given to adjusting the semaglutide  dosage to manage side effects while maintaining glycemic control. - Continue metformin  1000 mg twice daily. - Continue atorvastatin  80 mg daily. - Hold semaglutide  (Rybelsus ) for one month to assess impact on appetite and gastrointestinal symptoms. - Reassess semaglutide  dosage after one month; consider resuming at 3.5 mg if symptoms improve. - recheck A1c in 3months

## 2023-08-06 NOTE — Assessment & Plan Note (Signed)
 Hypertension, Chronic  Hypertension is well controlled on current medication regimen. - Continue hydralazine  10 mg three times daily. - Continue amlodipine . - Continue valsartan  160 mg with hydrochlorothiazide  25 mg daily.

## 2023-08-06 NOTE — Assessment & Plan Note (Signed)
  General Health Maintenance She requires updates on vaccinations and is considering vitamin supplementation. Discussed the impact of gummy vitamins on blood sugar levels and recommended pill form vitamins to avoid added sugars. - Recommend pneumococcal vaccine. - recommend Shingrix vaccine. - Recommend a daily multivitamin appropriate for age, such as Centrum or One A Day 50+. - Ensure adherence to weekly high-dose vitamin D  supplementation.

## 2023-08-06 NOTE — Assessment & Plan Note (Signed)
 Vitiligo, Chronic  Vitiligo is present with noted dryness around the mouth. She is not consistently using Opzelura due to concerns about ingestion and potential side effects. - continue Opzelura as prescribed by dermatology

## 2023-10-26 ENCOUNTER — Encounter: Payer: Self-pay | Admitting: Family Medicine

## 2023-10-28 ENCOUNTER — Other Ambulatory Visit: Payer: Self-pay

## 2023-10-28 DIAGNOSIS — E1159 Type 2 diabetes mellitus with other circulatory complications: Secondary | ICD-10-CM

## 2023-10-28 DIAGNOSIS — I10 Essential (primary) hypertension: Secondary | ICD-10-CM

## 2023-10-28 MED ORDER — METOPROLOL SUCCINATE ER 50 MG PO TB24: 50.0000 mg | ORAL_TABLET | Freq: Every day | ORAL | 1 refills | Status: AC

## 2023-10-28 MED ORDER — HYDRALAZINE HCL 10 MG PO TABS: ORAL_TABLET | ORAL | 0 refills | Status: DC

## 2023-10-28 NOTE — Telephone Encounter (Signed)
 Converted into refill request.

## 2023-11-20 ENCOUNTER — Other Ambulatory Visit: Payer: Self-pay | Admitting: Family Medicine

## 2023-11-20 DIAGNOSIS — Z1231 Encounter for screening mammogram for malignant neoplasm of breast: Secondary | ICD-10-CM

## 2023-11-25 ENCOUNTER — Ambulatory Visit
Admission: RE | Admit: 2023-11-25 | Discharge: 2023-11-25 | Disposition: A | Discharge status: Home / Self Care | Source: Ambulatory Visit | Attending: Family Medicine | Admitting: Family Medicine

## 2023-11-25 DIAGNOSIS — Z1231 Encounter for screening mammogram for malignant neoplasm of breast: Secondary | ICD-10-CM | POA: Diagnosis not present

## 2023-11-28 ENCOUNTER — Ambulatory Visit: Payer: Self-pay | Admitting: Family Medicine

## 2023-12-02 ENCOUNTER — Other Ambulatory Visit: Payer: Self-pay | Admitting: Family Medicine

## 2023-12-02 DIAGNOSIS — R1011 Right upper quadrant pain: Secondary | ICD-10-CM

## 2023-12-02 DIAGNOSIS — R112 Nausea with vomiting, unspecified: Secondary | ICD-10-CM

## 2023-12-15 ENCOUNTER — Ambulatory Visit: Admitting: Family Medicine

## 2023-12-15 ENCOUNTER — Encounter: Payer: Self-pay | Admitting: Family Medicine

## 2023-12-15 ENCOUNTER — Other Ambulatory Visit: Payer: Self-pay | Admitting: Family Medicine

## 2023-12-15 VITALS — BP 140/83 | HR 68 | Resp 16 | Wt 175.0 lb

## 2023-12-15 DIAGNOSIS — I152 Hypertension secondary to endocrine disorders: Secondary | ICD-10-CM

## 2023-12-15 DIAGNOSIS — E1159 Type 2 diabetes mellitus with other circulatory complications: Secondary | ICD-10-CM

## 2023-12-15 DIAGNOSIS — R112 Nausea with vomiting, unspecified: Secondary | ICD-10-CM

## 2023-12-15 DIAGNOSIS — K257 Chronic gastric ulcer without hemorrhage or perforation: Secondary | ICD-10-CM

## 2023-12-15 DIAGNOSIS — E559 Vitamin D deficiency, unspecified: Secondary | ICD-10-CM

## 2023-12-15 DIAGNOSIS — R4589 Other symptoms and signs involving emotional state: Secondary | ICD-10-CM

## 2023-12-15 DIAGNOSIS — Z7984 Long term (current) use of oral hypoglycemic drugs: Secondary | ICD-10-CM | POA: Diagnosis not present

## 2023-12-15 DIAGNOSIS — Z780 Asymptomatic menopausal state: Secondary | ICD-10-CM

## 2023-12-15 DIAGNOSIS — E119 Type 2 diabetes mellitus without complications: Secondary | ICD-10-CM | POA: Diagnosis not present

## 2023-12-15 DIAGNOSIS — R630 Anorexia: Secondary | ICD-10-CM

## 2023-12-15 MED ORDER — SUCRALFATE 1 G PO TABS: 1.0000 g | ORAL_TABLET | Freq: Three times a day (TID) | ORAL | 1 refills | Status: DC

## 2023-12-15 MED ORDER — GABAPENTIN 100 MG PO CAPS: 100.0000 mg | ORAL_CAPSULE | Freq: Every day | ORAL | 3 refills | Status: AC

## 2023-12-15 MED ORDER — VITAMIN D (ERGOCALCIFEROL) 1.25 MG (50000 UNIT) PO CAPS: 50000.0000 [IU] | ORAL_CAPSULE | ORAL | 0 refills | Status: AC

## 2023-12-15 MED ORDER — METFORMIN HCL 500 MG PO TABS: 1000.0000 mg | ORAL_TABLET | Freq: Two times a day (BID) | ORAL | 1 refills | Status: AC

## 2023-12-15 MED ORDER — PANTOPRAZOLE SODIUM 40 MG PO TBEC: 40.0000 mg | DELAYED_RELEASE_TABLET | Freq: Two times a day (BID) | ORAL | 1 refills | Status: AC

## 2023-12-15 NOTE — Patient Instructions (Signed)
 To keep you healthy, please keep in mind the following health maintenance items that you are due for:   Health Maintenance Due  Topic Date Due   Zoster Vaccines- Shingrix (1 of 2) Never done   Pneumococcal Vaccine: 50+ Years (2 of 2 - PCV) 08/30/2016   OPHTHALMOLOGY EXAM  07/31/2023   HEMOGLOBIN A1C  09/29/2023   Influenza Vaccine  10/17/2023   COVID-19 Vaccine (3 - 2025-26 season) 11/17/2023     Best Wishes,   Dr. Lang

## 2023-12-15 NOTE — Progress Notes (Signed)
 Established patient visit   Patient: Madison Ritter   DOB: [birth year]   63 y.o. Female  MRN: [id number] Visit Date: 12/15/2023  Today's healthcare provider: Rockie Agent, MD   Chief Complaint  Patient presents with   Acute Visit    Not able to eat, stomach feeling  pt states she has been feeling like this for About 2 wks. Pt also wants a referral to for depression   Subjective     HPI     Acute Visit    Additional comments: Not able to eat, stomach feeling  pt states she has been feeling like this for About 2 wks. Pt also wants a referral to for depression      Last edited by Marylen Odella CROME, CMA on 12/15/2023  2:05 PM.       Discussed the use of AI scribe software for clinical note transcription with the patient, who gave verbal consent to proceed.  History of Present Illness Madison Ritter is a 63 year old female with type 2 diabetes who presents with gastrointestinal distress and inability to eat.  She has been experiencing gastrointestinal distress for the past two weeks, characterized by a persistent feeling of an upset stomach and an inability to eat. There is a sensation of fullness after only a few bites of food, leading to significant weight loss despite her stomach appearing larger. She describes that 'nothing is pleasing' and she cannot finish meals, which has become a 'miserable feeling.' No vomiting, but she feels nauseous and sometimes lies down to alleviate the sensation. She has a history of an ulcer and wonders if it could be contributing to her symptoms.  She has been under significant stress at work, which she believes may exacerbate her condition. She has been unable to secure an earlier appointment with a gastroenterologist, with the earliest available being in February, which she finds unacceptable given her current state.  She has been taking Rybelsus  for her type 2 diabetes but has stopped it intermittently over the past month due to  concerns it might be contributing to her gastrointestinal symptoms. She is currently on metformin  1000 mg twice daily and requires a refill. She also takes Protonix  20 mg once daily for her ulcer.  She expresses concerns about her mental health, mentioning a possible recurrence of depression and attributing some emotional instability to menopause. She experiences hot flashes and emotional lability, which she associates with menopause. She has not had a period in over a year due to a hysterectomy performed twenty years ago.  She requires a refill of her vitamin D  50,000 units weekly, as she has not taken it for some time and suspects her levels remain low. She also mentions her blood pressure is elevated today, which she attributes to not having taken her medication.     Past Medical History:  Diagnosis Date   Diabetes mellitus without complication (HCC)    Hypertension     Medications: Outpatient Medications Prior to Visit  Medication Sig   acetaminophen  (TYLENOL ) 500 MG tablet Take 1,000 mg by mouth every 6 (six) hours as needed (for pain.).   amLODipine  (NORVASC ) 10 MG tablet TAKE 1 TABLET(10 MG) BY MOUTH DAILY   atorvastatin  (LIPITOR) 80 MG tablet TAKE 1 TABLET(80 MG) BY MOUTH DAILY   clobetasol  ointment (TEMOVATE ) 0.05 % Apply 1 Application topically 2 (two) times daily.   fluticasone  (FLONASE ) 50 MCG/ACT nasal spray Place 2 sprays into both nostrils daily.   hydrALAZINE  (APRESOLINE ) 10  MG tablet TAKE 1 TABLET(10 MG) BY MOUTH THREE TIMES DAILY   metoprolol  succinate (TOPROL -XL) 50 MG 24 hr tablet Take 1 tablet (50 mg total) by mouth daily.   nicotine  polacrilex (NICORETTE ) 2 MG gum Take 1 each (2 mg total) by mouth as needed for smoking cessation.   polyethylene glycol powder (GLYCOLAX /MIRALAX ) 17 GM/SCOOP powder Mix 1 capful in a Drink Once daily for Constipation.   valsartan -hydrochlorothiazide  (DIOVAN -HCT) 160-25 MG tablet Take 1 tablet by mouth daily.   [DISCONTINUED] metFORMIN   (GLUCOPHAGE ) 500 MG tablet Take 2 tablets (1,000 mg total) by mouth 2 (two) times daily with a meal.   [DISCONTINUED] pantoprazole  (PROTONIX ) 20 MG tablet Take 1 tablet (20 mg total) by mouth daily.   [DISCONTINUED] Semaglutide  (RYBELSUS ) 7 MG TABS Take 1 tablet (7 mg total) by mouth daily.   [DISCONTINUED] Vitamin D , Ergocalciferol , (DRISDOL ) 1.25 MG (50000 UNIT) CAPS capsule Take 1 capsule (50,000 Units total) by mouth every 7 (seven) days.   doxycycline  (VIBRA -TABS) 100 MG tablet Take 1 tablet (100 mg total) by mouth 2 (two) times daily. (Patient not taking: Reported on 12/15/2023)   No facility-administered medications prior to visit.    Review of Systems  Last CBC Lab Results  Component Value Date   WBC 12.1 (H) 10/23/2022   HGB 14.3 10/23/2022   HCT 42.6 10/23/2022   MCV 91 10/23/2022   MCH 30.6 10/23/2022   RDW 12.5 10/23/2022   PLT 363 10/23/2022   Last metabolic panel Lab Results  Component Value Date   GLUCOSE 168 (H) 03/28/2023   NA 140 03/28/2023   K 3.7 03/28/2023   CL 102 03/28/2023   CO2 22 03/28/2023   BUN 9 03/28/2023   CREATININE 0.80 03/28/2023   EGFR 84 03/28/2023   CALCIUM  9.9 03/28/2023   PROT 7.1 03/28/2023   ALBUMIN 4.1 03/28/2023   LABGLOB 3.0 03/28/2023   AGRATIO 1.4 06/07/2022   BILITOT 0.2 03/28/2023   ALKPHOS 136 (H) 03/28/2023   AST 20 03/28/2023   ALT 31 03/28/2023   ANIONGAP 10 04/12/2019   Last lipids Lab Results  Component Value Date   CHOL 128 01/23/2023   HDL 69 06/07/2022   LDLCALC 112 (H) 06/07/2022   TRIG 198 (H) 01/23/2023   CHOLHDL 3.0 06/07/2022   Last hemoglobin A1c Lab Results  Component Value Date   HGBA1C 6.7 (A) 06/30/2023   Last thyroid  functions Lab Results  Component Value Date   TSH 0.531 10/23/2022   Last vitamin D  Lab Results  Component Value Date   VD25OH 13.0 (L) 06/07/2022         Objective    BP (!) 140/83 (BP Location: Right Arm, Patient Position: Sitting, Cuff Size: Normal)   Pulse 68    Resp 16   Wt 175 lb (79.4 kg)   SpO2 100%   BMI 30.04 kg/m   BP Readings from Last 3 Encounters:  12/15/23 (!) 140/83  08/06/23 114/81  06/26/23 (!) 138/92   Wt Readings from Last 3 Encounters:  12/15/23 175 lb (79.4 kg)  08/06/23 174 lb (78.9 kg)  06/26/23 179 lb (81.2 kg)        Physical Exam  ABD: protuberant, normal BS, soft, no masses palpated   No results found for any visits on 12/15/23.  Assessment & Plan     Problem List Items Addressed This Visit     Hypertension associated with diabetes (HCC)   Relevant Medications   metFORMIN  (GLUCOPHAGE ) 500 MG tablet   Other  Relevant Orders   CMP14+EGFR   Nausea and vomiting   Relevant Medications   pantoprazole  (PROTONIX ) 40 MG tablet   sucralfate (CARAFATE) 1 g tablet   Other Relevant Orders   Ambulatory referral to Gastroenterology   CMP14+EGFR   Type 2 diabetes mellitus without complication, without long-term current use of insulin (HCC)   Relevant Medications   metFORMIN  (GLUCOPHAGE ) 500 MG tablet   Other Relevant Orders   Hemoglobin A1c   Other Visit Diagnoses       Chronic gastric ulcer without hemorrhage and without perforation    -  Primary   Relevant Medications   pantoprazole  (PROTONIX ) 40 MG tablet   sucralfate (CARAFATE) 1 g tablet   Other Relevant Orders   Ambulatory referral to Gastroenterology     Avitaminosis D       Relevant Medications   Vitamin D , Ergocalciferol , (DRISDOL ) 1.25 MG (50000 UNIT) CAPS capsule   Other Relevant Orders   VITAMIN D  25 Hydroxy (Vit-D Deficiency, Fractures)     Depressed mood         Anorexia         Menopause       Relevant Medications   gabapentin (NEURONTIN) 100 MG capsule       Assessment and Plan Assessment & Plan Gastrointestinal symptoms with suspected peptic ulcer disease Chronic gastrointestinal symptoms with suspected peptic ulcer disease, including abdominal discomfort, early satiety, and weight loss persisting for two weeks. Differential  diagnosis includes peptic ulcer disease, stress-related gastritis, and medication-induced gastritis. Rybelsus  is suspected to contribute to gastrointestinal distress. - Discontinue Rybelsus  temporarily - Increase Protonix  to 40 mg twice daily, 30 minutes before meals - Prescribe sucralfate 1 gram four times a day - Refer to gastroenterology for further evaluation and possible EGD  Type 2 diabetes mellitus Type 2 diabetes mellitus managed with metformin  and previously Rybelsus . Current A1c is 6.7%. Rybelsus  is temporarily discontinued due to gastrointestinal symptoms. - Continue metformin  1000 mg twice daily - Monitor A1c levels - Reassess the use of Rybelsus  after gastrointestinal symptoms are managed  Menopausal symptoms Chronic  Menopausal symptoms including hot flashes and emotional lability. She has a history of hysterectomy. Discussed potential treatments including hormone replacement therapy, clonidine, and gabapentin. - Prescribe gabapentin 100 mg at night, may increase to three times a day if tolerated - Discussed potential use of hormone replacement therapy and clonidine  Depression Chronic  Symptoms of depression possibly exacerbated by stress and menopausal symptoms. She expresses interest in seeking psychiatric referral through her job's insurance. - Encourage her to pursue psychiatric referral through her job's insurance  Vitamin D  deficiency Chronic  Vitamin D  deficiency requiring supplementation. Vitamin D  levels have not been checked since last year. - Refill vitamin D  50,000 units weekly - Check vitamin D  levels     Return in about 8 weeks (around 02/09/2024) for GI symptoms, restart Rybelsus  .         Rockie Agent, MD  [facility] [phone] (phone) [phone] (fax)  [facility]

## 2023-12-16 ENCOUNTER — Ambulatory Visit: Payer: Self-pay | Admitting: Family Medicine

## 2023-12-17 ENCOUNTER — Other Ambulatory Visit (HOSPITAL_COMMUNITY): Payer: Self-pay

## 2023-12-18 LAB — CMP14+EGFR
ALT: 15 IU/L (ref 0–32)
AST: 15 IU/L (ref 0–40)
Albumin: 4.2 g/dL (ref 3.9–4.9)
Alkaline Phosphatase: 125 IU/L (ref 49–135)
BUN/Creatinine Ratio: 16 (ref 12–28)
BUN: 14 mg/dL (ref 8–27)
Bilirubin Total: 0.3 mg/dL (ref 0.0–1.2)
CO2: 22 mmol/L (ref 20–29)
Calcium: 9.7 mg/dL (ref 8.7–10.3)
Chloride: 99 mmol/L (ref 96–106)
Creatinine, Ser: 0.87 mg/dL (ref 0.57–1.00)
Globulin, Total: 2.7 g/dL (ref 1.5–4.5)
Glucose: 88 mg/dL (ref 70–99)
Potassium: 3.5 mmol/L (ref 3.5–5.2)
Sodium: 139 mmol/L (ref 134–144)
Total Protein: 6.9 g/dL (ref 6.0–8.5)
eGFR: 76 mL/min/1.73 (ref >59)

## 2023-12-18 LAB — HEMOGLOBIN A1C
Est. average glucose Bld gHb Est-mCnc: 126 mg/dL
Hgb A1c MFr Bld: 6 % — ABNORMAL HIGH (ref 4.8–5.6)

## 2023-12-18 LAB — VITAMIN D 25 HYDROXY (VIT D DEFICIENCY, FRACTURES): Vit D, 25-Hydroxy: 37.9 ng/mL (ref 30.0–100.0)

## 2023-12-27 ENCOUNTER — Other Ambulatory Visit: Payer: Self-pay | Admitting: Family Medicine

## 2023-12-27 DIAGNOSIS — I152 Hypertension secondary to endocrine disorders: Secondary | ICD-10-CM

## 2024-01-27 ENCOUNTER — Other Ambulatory Visit: Payer: Self-pay | Admitting: Family Medicine

## 2024-01-27 DIAGNOSIS — I152 Hypertension secondary to endocrine disorders: Secondary | ICD-10-CM

## 2024-02-05 ENCOUNTER — Other Ambulatory Visit: Payer: Self-pay | Admitting: Family Medicine

## 2024-02-05 DIAGNOSIS — I10 Essential (primary) hypertension: Secondary | ICD-10-CM

## 2024-02-18 ENCOUNTER — Encounter: Payer: Self-pay | Admitting: Family Medicine

## 2024-02-18 ENCOUNTER — Other Ambulatory Visit (HOSPITAL_COMMUNITY)
Admission: RE | Admit: 2024-02-18 | Discharge: 2024-02-18 | Disposition: A | Source: Ambulatory Visit | Attending: Family Medicine | Admitting: Family Medicine

## 2024-02-18 ENCOUNTER — Ambulatory Visit: Admitting: Family Medicine

## 2024-02-18 VITALS — BP 148/87 | HR 81 | Ht 64.0 in | Wt 179.5 lb

## 2024-02-18 DIAGNOSIS — E1169 Type 2 diabetes mellitus with other specified complication: Secondary | ICD-10-CM | POA: Diagnosis not present

## 2024-02-18 DIAGNOSIS — N898 Other specified noninflammatory disorders of vagina: Secondary | ICD-10-CM

## 2024-02-18 DIAGNOSIS — N9089 Other specified noninflammatory disorders of vulva and perineum: Secondary | ICD-10-CM

## 2024-02-18 DIAGNOSIS — E1159 Type 2 diabetes mellitus with other circulatory complications: Secondary | ICD-10-CM

## 2024-02-18 DIAGNOSIS — E119 Type 2 diabetes mellitus without complications: Secondary | ICD-10-CM

## 2024-02-18 DIAGNOSIS — Z23 Encounter for immunization: Secondary | ICD-10-CM | POA: Diagnosis not present

## 2024-02-18 DIAGNOSIS — M7989 Other specified soft tissue disorders: Secondary | ICD-10-CM

## 2024-02-18 DIAGNOSIS — K257 Chronic gastric ulcer without hemorrhage or perforation: Secondary | ICD-10-CM

## 2024-02-18 MED ORDER — RYBELSUS 7 MG PO TABS: 7.0000 mg | ORAL_TABLET | Freq: Every day | ORAL | 2 refills | Status: AC

## 2024-02-18 MED ORDER — TRIAMCINOLONE ACETONIDE 0.1 % EX CREA: 1.0000 | TOPICAL_CREAM | Freq: Two times a day (BID) | CUTANEOUS | 0 refills | Status: AC

## 2024-02-18 MED ORDER — ATORVASTATIN CALCIUM 80 MG PO TABS: 80.0000 mg | ORAL_TABLET | Freq: Every day | ORAL | 3 refills | Status: AC

## 2024-02-18 NOTE — Patient Instructions (Signed)
 To keep you healthy, please keep in mind the following health maintenance items that you are due for:   Health Maintenance Due  Topic Date Due   Zoster Vaccines- Shingrix (1 of 2) Never done   Pneumococcal Vaccine: 50+ Years (2 of 2 - PCV) 08/30/2016   OPHTHALMOLOGY EXAM  07/31/2023   Influenza Vaccine  10/17/2023   Colonoscopy  05/02/2024     Best Wishes,   Dr. Lang

## 2024-02-18 NOTE — Progress Notes (Signed)
 Established patient visit   Patient: Madison Ritter   DOB: 1960-05-03   63 y.o. Female  MRN: [id number] Visit Date: 02/18/2024  Today's healthcare provider: Rockie Agent, MD   Chief Complaint  Patient presents with   Follow-up    Patient is present for f/u GI symptoms and to discuss restarting rybelsus . Reports some improvement with symptoms    Subjective     HPI     Follow-up    Additional comments: Patient is present for f/u GI symptoms and to discuss restarting rybelsus . Reports some improvement with symptoms       Last edited by Cherry Chiquita HERO, CMA on 02/18/2024  1:54 PM.       Discussed the use of AI scribe software for clinical note transcription with the patient, who gave verbal consent to proceed.  History of Present Illness Madison Ritter is a 63 year old female with diabetes who presents with gastrointestinal symptoms.  She experiences chronic right upper quadrant abdominal pain and gastrointestinal issues. She has a history of chronic GI problems without hemorrhage. Previously, she was advised to stop Rybelsus  and was prescribed sucralfate  1 gram four times a day, which did not make a noticeable difference. She is currently taking Protonix  40 mg twice daily, which has allowed her to eat more comfortably, although she is concerned about overeating.  She has a history of diabetes and is managing her glucose levels with medication. She wants to restart Rybelsus  to aid in glucose management, noting that the absence of Rybelsus  has made a significant difference in her eating habits. Her last A1c was 6.0 at the end of September.  She is on multiple medications for chronic hypertension, including metoprolol  50 mg daily, Valsartan , and hydrochlorothiazide  25 mg weekly. She did not take her blood pressure medication today, and her blood pressure was 148/83.  She experiences swelling in her right leg, which is persistent but not painful. Her mother, who has  congestive heart failure, has expressed concern about the swelling. There is no injury to the leg.  She mentions an itchy sensation around her genital area for the past three to four weeks, which is external and not associated with a rash or discharge. She has not changed soaps or detergents and is unsure of the cause. No vaginal discharge.     Past Medical History:  Diagnosis Date   Diabetes mellitus without complication (HCC)    Hypertension     Medications: Outpatient Medications Prior to Visit  Medication Sig   acetaminophen  (TYLENOL ) 500 MG tablet Take 1,000 mg by mouth every 6 (six) hours as needed (for pain.).   amLODipine  (NORVASC ) 10 MG tablet TAKE 1 TABLET(10 MG) BY MOUTH DAILY   clobetasol  ointment (TEMOVATE ) 0.05 % Apply 1 Application topically 2 (two) times daily.   fluticasone  (FLONASE ) 50 MCG/ACT nasal spray Place 2 sprays into both nostrils daily.   gabapentin  (NEURONTIN ) 100 MG capsule Take 1 capsule (100 mg total) by mouth at bedtime.   hydrALAZINE  (APRESOLINE ) 10 MG tablet TAKE 1 TABLET(10 MG) BY MOUTH THREE TIMES DAILY   metFORMIN  (GLUCOPHAGE ) 500 MG tablet Take 2 tablets (1,000 mg total) by mouth 2 (two) times daily with a meal.   metoprolol  succinate (TOPROL -XL) 50 MG 24 hr tablet Take 1 tablet (50 mg total) by mouth daily.   nicotine  polacrilex (NICORETTE ) 2 MG gum Take 1 each (2 mg total) by mouth as needed for smoking cessation.   pantoprazole  (PROTONIX ) 40 MG tablet Take 1  tablet (40 mg total) by mouth 2 (two) times daily before a meal.   polyethylene glycol powder (GLYCOLAX /MIRALAX ) 17 GM/SCOOP powder Mix 1 capful in a Drink Once daily for Constipation.   valsartan -hydrochlorothiazide  (DIOVAN -HCT) 160-25 MG tablet TAKE 1 TABLET BY MOUTH DAILY   Vitamin D , Ergocalciferol , (DRISDOL ) 1.25 MG (50000 UNIT) CAPS capsule Take 1 capsule (50,000 Units total) by mouth every 7 (seven) days.   [DISCONTINUED] atorvastatin  (LIPITOR) 80 MG tablet TAKE 1 TABLET(80 MG) BY MOUTH  DAILY   [DISCONTINUED] sucralfate  (CARAFATE ) 1 g tablet TAKE 1 TABLET(1 GRAM) BY MOUTH FOUR TIMES DAILY WITH MEALS AND AT BEDTIME   [DISCONTINUED] doxycycline  (VIBRA -TABS) 100 MG tablet Take 1 tablet (100 mg total) by mouth 2 (two) times daily. (Patient not taking: Reported on 12/15/2023)   No facility-administered medications prior to visit.    Review of Systems  Last metabolic panel Lab Results  Component Value Date   GLUCOSE 88 12/15/2023   NA 139 12/15/2023   K 3.5 12/15/2023   CL 99 12/15/2023   CO2 22 12/15/2023   BUN 14 12/15/2023   CREATININE 0.87 12/15/2023   EGFR 76 12/15/2023   CALCIUM  9.7 12/15/2023   PROT 6.9 12/15/2023   ALBUMIN 4.2 12/15/2023   LABGLOB 2.7 12/15/2023   AGRATIO 1.4 06/07/2022   BILITOT 0.3 12/15/2023   ALKPHOS 125 12/15/2023   AST 15 12/15/2023   ALT 15 12/15/2023   ANIONGAP 10 04/12/2019   Last lipids Lab Results  Component Value Date   CHOL 128 01/23/2023   HDL 69 06/07/2022   LDLCALC 112 (H) 06/07/2022   TRIG 198 (H) 01/23/2023   CHOLHDL 3.0 06/07/2022   Last hemoglobin A1c Lab Results  Component Value Date   HGBA1C 6.0 (H) 12/15/2023        Objective    BP (!) 148/87 (BP Location: Right Arm, Patient Position: Sitting, Cuff Size: Normal)   Pulse 81   Ht 5' 4 (1.626 m)   Wt 179 lb 8 oz (81.4 kg)   SpO2 100%   BMI 30.81 kg/m   BP Readings from Last 3 Encounters:  02/18/24 (!) 148/87  12/15/23 (!) 140/83  08/06/23 114/81   Wt Readings from Last 3 Encounters:  02/18/24 179 lb 8 oz (81.4 kg)  12/15/23 175 lb (79.4 kg)  08/06/23 174 lb (78.9 kg)        Physical Exam Vitals reviewed.  Constitutional:      General: She is not in acute distress.    Appearance: Normal appearance. She is not ill-appearing.  Cardiovascular:     Rate and Rhythm: Normal rate and regular rhythm.  Pulmonary:     Effort: Pulmonary effort is normal. No respiratory distress.     Breath sounds: No wheezing, rhonchi or rales.  Neurological:      Mental Status: She is alert and oriented to person, place, and time.  Psychiatric:        Mood and Affect: Mood normal.        Behavior: Behavior normal.       No results found for any visits on 02/18/24.  Assessment & Plan     Problem List Items Addressed This Visit     Hyperlipidemia associated with type 2 diabetes mellitus (HCC)   Chronic Continue lipitor 80mg  daily        Relevant Medications   atorvastatin  (LIPITOR) 80 MG tablet   Semaglutide  (RYBELSUS ) 7 MG TABS   Hypertension associated with diabetes (HCC)   Hypertension, Chronic  Hypertension was previously well controlled on current medication regimen. Elevated today  - Continue hydralazine  10 mg three times daily. - Continue amlodipine  10mg  daily  - Continue valsartan  160 mg with hydrochlorothiazide  25 mg daily.      Relevant Medications   atorvastatin  (LIPITOR) 80 MG tablet   Semaglutide  (RYBELSUS ) 7 MG TABS   Type 2 diabetes mellitus without complication, without long-term current use of insulin (HCC)   Relevant Medications   atorvastatin  (LIPITOR) 80 MG tablet   Semaglutide  (RYBELSUS ) 7 MG TABS   Other Visit Diagnoses       Chronic gastric ulcer without hemorrhage and without perforation    -  Primary     Labial irritation       Relevant Medications   triamcinolone  cream (KENALOG ) 0.1 %   Other Relevant Orders   Cervicovaginal ancillary only     Vaginal pruritus       Relevant Medications   triamcinolone  cream (KENALOG ) 0.1 %   Other Relevant Orders   Cervicovaginal ancillary only     Right leg swelling         Immunization due       Relevant Orders   Flu vaccine trivalent PF, 6mos and older(Flulaval,Afluria,Fluarix,Fluzone) (Completed)       Assessment and Plan Assessment & Plan Chronic GI ulcer  Chronic gastric ulcer without hemorrhage. Symptoms have improved with increased Protonix  dosage and sucralfate , though sucralfate  was ineffective. She reports improved eating tolerance without  Rybelsus . - Continue Protonix  40 mg twice daily - Discontinued sucralfate  - Restart Rybelsus  7 mg  Type 2 diabetes mellitus Chronic  Recent A1c of 6.0% as of September 29th, 2025. She wishes to restart Rybelsus  for glucose management. - Restart Rybelsus  7 mg -continue metformin  1000mg  BID  - Will schedule A1c check in February or March  Labial pruritus and irritation Acute  3-4 weeks, with no rash or discharge. Differential includes lichen sclerosus and possible yeast or bacterial vaginosis. - Prescribed triamcinolone  cream 0.1% for external application to labial irritation  - Performed swab to check for yeast and bacterial vaginosis  Right leg swelling acute Intermittent right leg swelling, possibly related to amlodipine  use. No pain or signs of deep vein thrombosis. Swelling is not concerning for cardiac or renal issues. - Advised leg elevation when resting    General Health Maintenance She is due for a flu shot and expresses reluctance to visit [facility] for it. - influenza vaccine administered today      Return in about 3 months (around 05/18/2024) for DM, HTN.         Rockie Agent, MD  [facility] [phone] (phone) [phone] (fax)  [facility]

## 2024-02-18 NOTE — Assessment & Plan Note (Signed)
Chronic.  Continue lipitor 80mg  daily

## 2024-02-18 NOTE — Assessment & Plan Note (Addendum)
 Hypertension, Chronic  Hypertension was previously well controlled on current medication regimen. Elevated today  - Continue hydralazine  10 mg three times daily. - Continue amlodipine  10mg  daily  - Continue valsartan  160 mg with hydrochlorothiazide  25 mg daily.

## 2024-02-23 LAB — CERVICOVAGINAL ANCILLARY ONLY
Bacterial Vaginitis (gardnerella): NEGATIVE
Candida Glabrata: POSITIVE — AB
Candida Vaginitis: POSITIVE — AB
Comment: NEGATIVE
Comment: NEGATIVE
Comment: NEGATIVE

## 2024-02-24 ENCOUNTER — Other Ambulatory Visit: Payer: Self-pay | Admitting: Family Medicine

## 2024-02-24 DIAGNOSIS — I10 Essential (primary) hypertension: Secondary | ICD-10-CM

## 2024-02-24 NOTE — Telephone Encounter (Unsigned)
 Copied from CRM [id number]. Topic: Clinical - Medication Refill >> Feb 24, 2024  1:52 PM Sophia H wrote: Medication: amLODipine  (NORVASC ) 10 MG tablet   Has the patient contacted their pharmacy? Yes, updated RX  This is the patient's preferred pharmacy:  [id number] [place], [place] - [address] AT [facility] OF [address] [place] [address] [place] [address] Phone: [phone] Fax: [phone]  Is this the correct pharmacy for this prescription? Yes If no, delete pharmacy and type the correct one.   Has the prescription been filled recently? Yes  Is the patient out of the medication? Yes  Has the patient been seen for an appointment in the last year OR does the patient have an upcoming appointment? Yes, seen Dec 3   Can we respond through MyChart? Yes  Agent: Please be advised that Rx refills may take up to 3 business days. We ask that you follow-up with your pharmacy.

## 2024-02-26 MED ORDER — AMLODIPINE BESYLATE 10 MG PO TABS: ORAL_TABLET | ORAL | 1 refills | Status: AC

## 2024-02-26 NOTE — Telephone Encounter (Signed)
 Requested Prescriptions  Pending Prescriptions Disp Refills   amLODipine  (NORVASC ) 10 MG tablet 90 tablet 1    Sig: TAKE 1 TABLET(10 MG) BY MOUTH DAILY     Cardiovascular: Calcium  Channel Blockers 2 Failed - 02/26/2024 12:54 PM      Failed - Last BP in normal range    BP Readings from Last 1 Encounters:  02/18/24 (!) 148/87         Passed - Last Heart Rate in normal range    Pulse Readings from Last 1 Encounters:  02/18/24 81         Passed - Valid encounter within last 6 months    Recent Outpatient Visits           1 week ago Chronic gastric ulcer without hemorrhage and without perforation   [place] [facility] Simmons-Robinson, [place], MD   2 months ago Chronic gastric ulcer without hemorrhage and without perforation   [place] [facility] [place], [place], MD   6 months ago Hypertension associated with diabetes [facility])   [place] [facility] Simmons-Robinson, [place], MD   8 months ago Type 2 diabetes mellitus without complication, without long-term current use of insulin (HCC)   [place] [facility] [place], [place], MD

## 2024-03-01 ENCOUNTER — Ambulatory Visit: Payer: Self-pay | Admitting: Family Medicine

## 2024-03-01 DIAGNOSIS — B3731 Acute candidiasis of vulva and vagina: Secondary | ICD-10-CM

## 2024-03-01 MED ORDER — FLUCONAZOLE 150 MG PO TABS: ORAL_TABLET | ORAL | 0 refills | Status: AC

## 2024-03-01 NOTE — Telephone Encounter (Signed)
-----   Message from Rockie Agent, MD sent at 03/01/2024  8:00 AM EST ----- Vaginal swab was positive for yeast. Will send diflucan  for persistent symptoms. Take 150mg  by mouth and repeat in 72 hours if symptoms continue

## 2024-04-21 ENCOUNTER — Other Ambulatory Visit: Payer: Self-pay | Admitting: Family Medicine

## 2024-04-21 DIAGNOSIS — E1159 Type 2 diabetes mellitus with other circulatory complications: Secondary | ICD-10-CM

## 2024-04-21 NOTE — Telephone Encounter (Signed)
 Copied from CRM #[id number]. Topic: Clinical - Medication Refill >> Apr 21, 2024  9:42 AM Tonda B wrote: Medication:  valsartan -hydrochlorothiazide  (DIOVAN -HCT) 160-25 MG tablet  Has the patient contacted their pharmacy? Yes (Agent: If no, request that the patient contact the pharmacy for the refill. If patient does not wish to contact the pharmacy document the reason why and proceed with request.) (Agent: If yes, when and what did the pharmacy advise?)  This is the patient's preferred pharmacy:  [id number] [place], [place] - [address] AT [facility] OF [address] [place] [address] [place] [address] Phone: [phone] Fax: [phone]  Is this the correct pharmacy for this prescription? Yes If no, delete pharmacy and type the correct one.   Has the prescription been filled recently? Yes  Is the patient out of the medication? Yes  Has the patient been seen for an appointment in the last year OR does the patient have an upcoming appointment? Yes  Can we respond through MyChart? No  Agent: Please be advised that Rx refills may take up to 3 business days. We ask that you follow-up with your pharmacy.

## 2024-04-23 MED ORDER — VALSARTAN-HYDROCHLOROTHIAZIDE 160-25 MG PO TABS: 1.0000 | ORAL_TABLET | Freq: Every day | ORAL | 1 refills | Status: AC

## 2024-04-23 NOTE — Telephone Encounter (Signed)
 Requested by patient . Future visit 05/18/24.  Requested Prescriptions  Pending Prescriptions Disp Refills   valsartan -hydrochlorothiazide  (DIOVAN -HCT) 160-25 MG tablet 90 tablet 1    Sig: Take 1 tablet by mouth daily.     Cardiovascular: ARB + Diuretic Combos Failed - 04/23/2024  8:15 AM      Failed - Last BP in normal range    BP Readings from Last 1 Encounters:  02/18/24 (!) 148/87         Passed - K in normal range and within 180 days    Potassium  Date Value Ref Range Status  12/15/2023 3.5 3.5 - 5.2 mmol/L Final         Passed - Na in normal range and within 180 days    Sodium  Date Value Ref Range Status  12/15/2023 139 134 - 144 mmol/L Final         Passed - Cr in normal range and within 180 days    Creatinine, Ser  Date Value Ref Range Status  12/15/2023 0.87 0.57 - 1.00 mg/dL Final         Passed - eGFR is 10 or above and within 180 days    GFR calc Af Amer  Date Value Ref Range Status  03/15/2020 99 >59 mL/min/1.73 Final    Comment:    **In accordance with recommendations from the NKF-ASN Task force,**   [facility] is in the process of updating its eGFR calculation to the   2021 CKD-EPI creatinine equation that estimates kidney function   without a race variable.    GFR calc non Af Amer  Date Value Ref Range Status  03/15/2020 86 >59 mL/min/1.73 Final   eGFR  Date Value Ref Range Status  12/15/2023 76 >59 mL/min/1.73 Final         Passed - Patient is not pregnant      Passed - Valid encounter within last 6 months    Recent Outpatient Visits           2 months ago Chronic gastric ulcer without hemorrhage and without perforation   [facility] Simmons-Robinson, [place], MD   4 months ago Chronic gastric ulcer without hemorrhage and without perforation   [facility] Simmons-Robinson, [place], MD   8 months ago Hypertension associated with diabetes [facility])   [place]  Simmons-Robinson, [place], MD   10 months ago Type 2 diabetes mellitus without complication, without long-term current use of insulin (HCC)    [facility] [place], [place], MD

## 2024-05-18 ENCOUNTER — Ambulatory Visit: Admitting: Family Medicine
